# Patient Record
Sex: Female | Born: 1955 | Race: White | Hispanic: No | Smoking: Never smoker
Health system: Southern US, Community
[De-identification: ages and names within clinical notes are randomized; demographics above are authoritative.]

## PROBLEM LIST (undated history)

## (undated) DIAGNOSIS — E785 Hyperlipidemia, unspecified: Secondary | ICD-10-CM

## (undated) DIAGNOSIS — E669 Obesity, unspecified: Secondary | ICD-10-CM

## (undated) DIAGNOSIS — I2510 Atherosclerotic heart disease of native coronary artery without angina pectoris: Secondary | ICD-10-CM

## (undated) DIAGNOSIS — F419 Anxiety disorder, unspecified: Secondary | ICD-10-CM

## (undated) DIAGNOSIS — G4700 Insomnia, unspecified: Secondary | ICD-10-CM

## (undated) DIAGNOSIS — A4902 Methicillin resistant Staphylococcus aureus infection, unspecified site: Secondary | ICD-10-CM

## (undated) DIAGNOSIS — H269 Unspecified cataract: Secondary | ICD-10-CM

## (undated) DIAGNOSIS — N200 Calculus of kidney: Secondary | ICD-10-CM

## (undated) DIAGNOSIS — R079 Chest pain, unspecified: Secondary | ICD-10-CM

## (undated) DIAGNOSIS — R490 Dysphonia: Secondary | ICD-10-CM

## (undated) DIAGNOSIS — G629 Polyneuropathy, unspecified: Secondary | ICD-10-CM

## (undated) DIAGNOSIS — J189 Pneumonia, unspecified organism: Secondary | ICD-10-CM

## (undated) DIAGNOSIS — R55 Syncope and collapse: Secondary | ICD-10-CM

## (undated) DIAGNOSIS — K219 Gastro-esophageal reflux disease without esophagitis: Secondary | ICD-10-CM

## (undated) DIAGNOSIS — F329 Major depressive disorder, single episode, unspecified: Secondary | ICD-10-CM

## (undated) DIAGNOSIS — G4730 Sleep apnea, unspecified: Secondary | ICD-10-CM

## (undated) DIAGNOSIS — C189 Malignant neoplasm of colon, unspecified: Secondary | ICD-10-CM

## (undated) DIAGNOSIS — F4320 Adjustment disorder, unspecified: Secondary | ICD-10-CM

## (undated) DIAGNOSIS — M1990 Unspecified osteoarthritis, unspecified site: Secondary | ICD-10-CM

## (undated) DIAGNOSIS — F341 Dysthymic disorder: Secondary | ICD-10-CM

## (undated) HISTORY — PX: OTHER SURGICAL HISTORY: SHX169

## (undated) HISTORY — DX: Dysphonia: R49.0

## (undated) HISTORY — DX: Insomnia, unspecified: G47.00

## (undated) HISTORY — PX: TUBAL LIGATION: SHX77

## (undated) HISTORY — DX: Calculus of kidney: N20.0

## (undated) HISTORY — DX: Anxiety disorder, unspecified: F41.9

## (undated) HISTORY — DX: Polyneuropathy, unspecified: G62.9

## (undated) HISTORY — DX: Major depressive disorder, single episode, unspecified: F32.9

## (undated) HISTORY — PX: ABDOMINAL HYSTERECTOMY: SHX81

## (undated) HISTORY — DX: Methicillin resistant Staphylococcus aureus infection, unspecified site: A49.02

## (undated) HISTORY — DX: Sleep apnea, unspecified: G47.30

## (undated) HISTORY — PX: CATARACT EXTRACTION, BILATERAL: SHX1313

## (undated) HISTORY — DX: Dysthymic disorder: F34.1

## (undated) HISTORY — DX: Unspecified cataract: H26.9

## (undated) HISTORY — PX: UPPER GASTROINTESTINAL ENDOSCOPY: SHX188

## (undated) HISTORY — DX: Gastro-esophageal reflux disease without esophagitis: K21.9

## (undated) HISTORY — PX: BACK SURGERY: SHX140

## (undated) HISTORY — PX: BLADDER SURGERY: SHX569

## (undated) HISTORY — DX: Unspecified osteoarthritis, unspecified site: M19.90

## (undated) HISTORY — PX: PORT-A-CATH REMOVAL: SHX5289

## (undated) HISTORY — DX: Malignant neoplasm of colon, unspecified: C18.9

## (undated) HISTORY — DX: Adjustment disorder, unspecified: F43.20

---

## 2010-01-08 ENCOUNTER — Encounter: Admission: RE | Admit: 2010-01-08 | Discharge: 2010-01-08 | Payer: Self-pay | Admitting: Neurology

## 2010-06-14 DIAGNOSIS — C189 Malignant neoplasm of colon, unspecified: Secondary | ICD-10-CM | POA: Insufficient documentation

## 2010-06-14 HISTORY — PX: COLON SURGERY: SHX602

## 2010-06-14 HISTORY — DX: Malignant neoplasm of colon, unspecified: C18.9

## 2015-04-09 HISTORY — PX: COLONOSCOPY: SHX174

## 2016-01-07 DIAGNOSIS — C189 Malignant neoplasm of colon, unspecified: Secondary | ICD-10-CM | POA: Diagnosis not present

## 2016-12-18 DIAGNOSIS — R55 Syncope and collapse: Secondary | ICD-10-CM | POA: Diagnosis not present

## 2016-12-18 DIAGNOSIS — E785 Hyperlipidemia, unspecified: Secondary | ICD-10-CM | POA: Diagnosis not present

## 2016-12-18 DIAGNOSIS — R079 Chest pain, unspecified: Secondary | ICD-10-CM | POA: Diagnosis not present

## 2016-12-18 DIAGNOSIS — I2510 Atherosclerotic heart disease of native coronary artery without angina pectoris: Secondary | ICD-10-CM | POA: Diagnosis not present

## 2016-12-19 ENCOUNTER — Observation Stay (HOSPITAL_COMMUNITY)
Admission: AD | Admit: 2016-12-19 | Discharge: 2016-12-20 | Disposition: A | Discharge status: Home / Self Care | Payer: Medicare HMO | Source: Other Acute Inpatient Hospital | Attending: Internal Medicine | Admitting: Internal Medicine

## 2016-12-19 ENCOUNTER — Encounter (HOSPITAL_COMMUNITY): Payer: Self-pay | Admitting: Internal Medicine

## 2016-12-19 DIAGNOSIS — I2510 Atherosclerotic heart disease of native coronary artery without angina pectoris: Secondary | ICD-10-CM | POA: Diagnosis present

## 2016-12-19 DIAGNOSIS — Z91040 Latex allergy status: Secondary | ICD-10-CM | POA: Insufficient documentation

## 2016-12-19 DIAGNOSIS — R079 Chest pain, unspecified: Principal | ICD-10-CM | POA: Insufficient documentation

## 2016-12-19 DIAGNOSIS — R0789 Other chest pain: Secondary | ICD-10-CM | POA: Diagnosis present

## 2016-12-19 DIAGNOSIS — R55 Syncope and collapse: Secondary | ICD-10-CM | POA: Diagnosis not present

## 2016-12-19 DIAGNOSIS — E785 Hyperlipidemia, unspecified: Secondary | ICD-10-CM | POA: Diagnosis not present

## 2016-12-19 DIAGNOSIS — J189 Pneumonia, unspecified organism: Secondary | ICD-10-CM | POA: Diagnosis present

## 2016-12-19 DIAGNOSIS — Z7982 Long term (current) use of aspirin: Secondary | ICD-10-CM | POA: Insufficient documentation

## 2016-12-19 DIAGNOSIS — E669 Obesity, unspecified: Secondary | ICD-10-CM | POA: Diagnosis present

## 2016-12-19 HISTORY — DX: Hyperlipidemia, unspecified: E78.5

## 2016-12-19 HISTORY — DX: Obesity, unspecified: E66.9

## 2016-12-19 HISTORY — DX: Pneumonia, unspecified organism: J18.9

## 2016-12-19 HISTORY — DX: Chest pain, unspecified: R07.9

## 2016-12-19 HISTORY — DX: Syncope and collapse: R55

## 2016-12-19 HISTORY — DX: Atherosclerotic heart disease of native coronary artery without angina pectoris: I25.10

## 2016-12-19 HISTORY — DX: Other chest pain: R07.89

## 2016-12-19 LAB — GLUCOSE, CAPILLARY
Glucose-Capillary: 134 mg/dL — ABNORMAL HIGH (ref 65–99)
Glucose-Capillary: 106 mg/dL — ABNORMAL HIGH (ref 65–99)

## 2016-12-19 LAB — COMPREHENSIVE METABOLIC PANEL WITH GFR
AST: 27 U/L (ref 15–41)
Albumin: 3.3 g/dL — ABNORMAL LOW (ref 3.5–5.0)
Alkaline Phosphatase: 83 U/L (ref 38–126)
BUN: 12 mg/dL (ref 6–20)
Calcium: 8.6 mg/dL — ABNORMAL LOW (ref 8.9–10.3)
GFR calc non Af Amer: >60 mL/min (ref >60)
Glucose, Bld: 88 mg/dL (ref 65–99)
Potassium: 4 mmol/L (ref 3.5–5.1)
Total Bilirubin: 1.1 mg/dL (ref 0.3–1.2)
Total Protein: 7.1 g/dL (ref 6.5–8.1)

## 2016-12-19 LAB — CBC
HCT: 42.2 % (ref 36.0–46.0)
Hemoglobin: 13.6 g/dL (ref 12.0–15.0)
MCH: 29.1 pg (ref 26.0–34.0)
MCHC: 32.2 g/dL (ref 30.0–36.0)
MCV: 90.2 fL (ref 78.0–100.0)
Platelets: 144 K/uL — ABNORMAL LOW (ref 150–400)
RBC: 4.68 MIL/uL (ref 3.87–5.11)
RDW: 14 % (ref 11.5–15.5)
WBC: 11.6 K/uL — ABNORMAL HIGH (ref 4.0–10.5)

## 2016-12-19 LAB — COMPREHENSIVE METABOLIC PANEL
ALT: 19 U/L (ref 14–54)
Anion gap: 12 (ref 5–15)
CO2: 21 mmol/L — ABNORMAL LOW (ref 22–32)
Chloride: 105 mmol/L (ref 101–111)
Creatinine, Ser: 0.91 mg/dL (ref 0.44–1.00)
GFR calc Af Amer: >60 mL/min (ref >60)
Sodium: 138 mmol/L (ref 135–145)

## 2016-12-19 MED ORDER — DULOXETINE HCL 60 MG PO CPEP
60.0000 mg | ORAL_CAPSULE | Freq: Every day | ORAL | Status: DC
  Administered 2016-12-19 – 2016-12-20 (×2): 60 mg via ORAL
  Filled 2016-12-19 (×2): qty 1

## 2016-12-19 MED ORDER — NITROGLYCERIN 0.4 MG SL SUBL
SUBLINGUAL_TABLET | SUBLINGUAL | Status: AC
  Administered 2016-12-19: 0.4 mg
  Filled 2016-12-19: qty 1

## 2016-12-19 MED ORDER — KETOROLAC TROMETHAMINE 15 MG/ML IJ SOLN: 15.0000 mg | Freq: Four times a day (QID) | INTRAMUSCULAR | Status: DC | PRN

## 2016-12-19 MED ORDER — LEVOFLOXACIN 500 MG PO TABS
500.0000 mg | ORAL_TABLET | Freq: Every day | ORAL | Status: DC
  Administered 2016-12-19 – 2016-12-20 (×2): 500 mg via ORAL
  Filled 2016-12-19 (×2): qty 1

## 2016-12-19 MED ORDER — PREDNISONE 10 MG PO TABS
10.0000 mg | ORAL_TABLET | Freq: Every day | ORAL | Status: DC
  Administered 2016-12-20: 10 mg via ORAL
  Filled 2016-12-19: qty 1

## 2016-12-19 MED ORDER — HEPARIN SODIUM (PORCINE) 5000 UNIT/ML IJ SOLN
5000.0000 [IU] | Freq: Three times a day (TID) | INTRAMUSCULAR | Status: AC
  Administered 2016-12-19 (×2): 5000 [IU] via SUBCUTANEOUS
  Filled 2016-12-19 (×2): qty 1

## 2016-12-19 MED ORDER — NITROGLYCERIN 0.4 MG SL SUBL
0.4000 mg | SUBLINGUAL_TABLET | SUBLINGUAL | Status: DC | PRN
  Administered 2016-12-19: 0.4 mg via SUBLINGUAL
  Filled 2016-12-19: qty 1

## 2016-12-19 MED ORDER — ONDANSETRON HCL 4 MG/2ML IJ SOLN: 4.0000 mg | Freq: Four times a day (QID) | INTRAMUSCULAR | Status: DC | PRN

## 2016-12-19 MED ORDER — ACETAMINOPHEN 325 MG PO TABS
650.0000 mg | ORAL_TABLET | Freq: Four times a day (QID) | ORAL | Status: DC | PRN
  Administered 2016-12-19: 650 mg via ORAL
  Filled 2016-12-19: qty 2

## 2016-12-19 MED ORDER — ONDANSETRON HCL 4 MG PO TABS: 4.0000 mg | ORAL_TABLET | Freq: Four times a day (QID) | ORAL | Status: DC | PRN

## 2016-12-19 MED ORDER — ASPIRIN 325 MG PO TABS
325.0000 mg | ORAL_TABLET | Freq: Every day | ORAL | Status: DC
  Administered 2016-12-20: 325 mg via ORAL
  Filled 2016-12-19: qty 1

## 2016-12-19 MED ORDER — ACETAMINOPHEN 650 MG RE SUPP: 650.0000 mg | Freq: Four times a day (QID) | RECTAL | Status: DC | PRN

## 2016-12-19 MED ORDER — ROSUVASTATIN CALCIUM 10 MG PO TABS
20.0000 mg | ORAL_TABLET | Freq: Every day | ORAL | Status: DC
  Administered 2016-12-19 – 2016-12-20 (×2): 20 mg via ORAL
  Filled 2016-12-19 (×2): qty 2

## 2016-12-19 MED ORDER — SODIUM CHLORIDE 0.9% FLUSH
3.0000 mL | Freq: Two times a day (BID) | INTRAVENOUS | Status: DC
  Administered 2016-12-19 – 2016-12-20 (×3): 3 mL via INTRAVENOUS

## 2016-12-19 NOTE — H&P (Signed)
History and Physical    [name] ZTI:[id number] DOB: 1955/09/05 DOA: 12/19/2016  PCP: Charlynn Court, NP Patient coming from: [facility] in [place]: chest pain  HPI: [name] is a 61 y.o. female with medical history significant for coronary artery disease, status post cardiac cath 5 years ago with nonobstructive CAD, hyperlipidemia, GERD, obesity is admitted to room 42 on 3 W. at [facility] from [place] for possible cardiac cath.  Patient admitted to and off hospital in [place] yesterday chief complaint chest pain shortness of breath dizziness presyncope. Associated symptoms include palpation diaphoresis nausea without vomiting. Risk factors include known CAD, obesity, hyperlipidemia. She was admitted last seventh for observation. Chart review indicates no significant events on telemetry cardiac enzymes were negative 4 EKG on admission no acute changes. She was started on full dose aspirin. During the night last night she had an event of chest pain which resolved with nitroglycerin. Repeat EKG this morning revealed some flattening of T-wave. Case discussed with cardiology Dr. Tempie Hoist who [facility].  Patient reports she was chest pain-free until her ride over here. He is provided with morphine and states on admission pain "barely there". She denies headache dizziness nausea vomiting diarrhea constipation. She denies dysuria hematuria frequency or urgency. She denies fever chills cough lower extremity edema or orthopnea.    ED Course: Not applicable  Review of Systems: As per HPI otherwise all other systems reviewed and are negative. 10 point review of systems complete and all systems are negative except as indicated in the history of present illness  Ambulatory Status: He relates independently  Past Medical History:  Diagnosis Date  . CAD (coronary artery disease)   . Chest pain   . Hyperlipidemia   . Obesity   .  Pneumonia   . Pre-syncope     History reviewed. No pertinent surgical history.  Social History   Social History  . Marital status: Widowed    Spouse name: N/A  . Number of children: N/A  . Years of education: N/A   Occupational History  . Not on file.   Social History Main Topics  . Smoking status: Not on file  . Smokeless tobacco: Not on file  . Alcohol use Not on file  . Drug use: Unknown  . Sexual activity: Not on file   Other Topics Concern  . Not on file   Social History Narrative  . No narrative on file    Allergies  Allergen Reactions  . Penicillins Rash  . Codeine Other (See Comments)    dizziness  . Guaifenesin & Derivatives Rash  . Latex Rash    Usually from adhesive tapes  . Pseudoephedrine Rash  . Sulfa Antibiotics Rash    History reviewed. No pertinent family history.  Prior to Admission medications   Not on File    Physical Exam: Vitals:   12/19/16 1441  BP: 113/82  Pulse: 100  Resp: 18  Temp: 98.4 F (36.9 C)  TempSrc: Oral  SpO2: 99%  Weight: 70.9 kg (156 lb 6.4 oz)  Height: 5' (1.524 m)     General:  Appears calm and comfortable, obese standing side of the bed in no acute distress Eyes:  PERRL, EOMI, normal lids, iris ENT:  grossly normal hearing, lips & tongue, mmm Neck:  no LAD, masses or thyromegaly Cardiovascular:  RRR, no m/r/g. No LE edema.  Respiratory:  CTA bilaterally, no w/r/r. Normal respiratory effort. Abdomen:  soft,  ntnd, obese soft positive bowel sounds no guarding or rebounding Skin:  no rash or induration seen on limited exam Musculoskeletal:  grossly normal tone BUE/BLE, good ROM, no bony abnormality Psychiatric:  grossly normal mood and affect, speech fluent and appropriate, AOx3 Neurologic:  CN 2-12 grossly intact, moves all extremities in coordinated fashion, sensation intact  Labs on Admission: I have personally reviewed following labs and imaging studies  CBC: No results for input(s): WBC,  NEUTROABS, HGB, HCT, MCV, PLT in the last 168 hours. Basic Metabolic Panel: No results for input(s): NA, K, CL, CO2, GLUCOSE, BUN, CREATININE, CALCIUM, MG, PHOS in the last 168 hours. GFR: CrCl cannot be calculated (No order found.). Liver Function Tests: No results for input(s): AST, ALT, ALKPHOS, BILITOT, PROT, ALBUMIN in the last 168 hours. No results for input(s): LIPASE, AMYLASE in the last 168 hours. No results for input(s): AMMONIA in the last 168 hours. Coagulation Profile: No results for input(s): INR, PROTIME in the last 168 hours. Cardiac Enzymes: No results for input(s): CKTOTAL, CKMB, CKMBINDEX, TROPONINI in the last 168 hours. BNP (last 3 results) No results for input(s): PROBNP in the last 8760 hours. HbA1C: No results for input(s): HGBA1C in the last 72 hours. CBG: No results for input(s): GLUCAP in the last 168 hours. Lipid Profile: No results for input(s): CHOL, HDL, LDLCALC, TRIG, CHOLHDL, LDLDIRECT in the last 72 hours. Thyroid Function Tests: No results for input(s): TSH, T4TOTAL, FREET4, T3FREE, THYROIDAB in the last 72 hours. Anemia Panel: No results for input(s): VITAMINB12, FOLATE, FERRITIN, TIBC, IRON, RETICCTPCT in the last 72 hours. Urine analysis: No results found for: COLORURINE, APPEARANCEUR, LABSPEC, PHURINE, GLUCOSEU, HGBUR, BILIRUBINUR, KETONESUR, PROTEINUR, UROBILINOGEN, NITRITE, LEUKOCYTESUR  Creatinine Clearance: CrCl cannot be calculated (No order found.).  Sepsis Labs: @LABRCNTIP (procalcitonin:4,lacticidven:4) )No results found for this or any previous visit (from the past 240 hour(s)).   Radiological Exams on Admission: No results found.  EKG: Independently reviewed. Normal sinus rhythm Normal ECG  Assessment/Plan Principal Problem:   Chest pain Active Problems:   Pre-syncope   CAD (coronary artery disease)   Hyperlipidemia   Obesity   Pneumonia   #1. Chest pain. Some typical and atypical features. Heart score 4. Troponins  negative 4. EKG as noted above. Chest x-ray yesterday with left base opacity. She did experience chest pain last night with some EKG changes. Evaluated by cardiology and aspirate today who recommended [facility] -Monitor on telemetry -Continue aspirin and statin -Follow echo results -Informed Dr. Jacinta Shoe of patient arrival -Nothing by mouth past midnight  #2. Pneumonia. Chest x-ray with finding of left base opacity. Patient did report a mild cough. She was provided with Rocephin and azithromycin empirically. Discharged with Levaquin at time of transfer. She is afebrile hemodynamically stable and not hypoxic. -continue levaquin  #3. CAD. Reportedly had a cath greater than 5 years ago revealing nonobstructive CAD. -See #1 -Aspirin and statin -A cardiology recommendations     DVT prophylaxis: heparin Code Status: full  Family Communication: daughter at bedside  Disposition Plan: home  Consults called: dr Jacinta Shoe  Admission status: obs   Radene Gunning MD [facility]  If 7PM-7AM, please contact night-coverage [email or web address] Password [facility]  12/19/2016, 3:43 PM

## 2016-12-19 NOTE — Consult Note (Signed)
Cardiology Consult    Patient ID: [name] MRN: [id number], DOB/AGE: 1956-04-18   Admit date: 12/19/2016 Date of Consult: 12/19/2016  Primary Physician: Charlynn Court, NP Reason for Consult: Chest Pain and Syncope Primary Cardiologist: New to [facility]  Requesting Provider: Dr. Aggie Moats   History of Present Illness    [name] is a 61 y.o. female with past medical history of CAD (nonobstructive CAD by cath 5+ years ago), HLD, and GERD who is being seen today for the evaluation of chest pain and syncope at the request of Dr. Aggie Moats.  In talking with the patient today, she reports having an episode of chest pain while attending a funeral. She describes it as a shooting pain which radiated across her chest. It only lasted for a few seconds then spontaneously resolved, then she developed a second episode which prompted her to call EMS. She denies any recent dyspnea on exertion, orthopnea, PND, or lower extremity edema. Experiences palpitations which occur for seconds at a time (actively having PVC's on telemetry during this encounter).   While at [facility], cardiac enzymes remained negative x4. Creatinine 0.80. CXR showed a focal area of increased opacity along the medial left base concerning for a focal PNA. EKG shows NSR, HR 91, with mild T-wave flattening in Lead III.    She has noted recurrent episodes of chest pain since admission at [facility] which has been relieved with SL NTG.   She reports having a heart catheterization 5+ years ago but did not require stent placement or PCI at that time. She reports having known HLD but denies any known HTN, Type 2 DM, or Stage 3 CKD. She denies any tobacco use or alcohol use. She does have a family history of CAD as her mother passed away from an MI in her 4's.    Past Medical History   Past Medical History:  Diagnosis Date  . CAD (coronary artery disease)   . Chest pain   . Hyperlipidemia   . Obesity   . Pneumonia   . Pre-syncope      History reviewed. No pertinent surgical history.   Allergies  Allergies  Allergen Reactions  . Penicillins Rash  . Codeine Other (See Comments)    dizziness  . Guaifenesin & Derivatives Rash  . Latex Rash    Usually from adhesive tapes  . Pseudoephedrine Rash  . Sulfa Antibiotics Rash    Inpatient Medications    . [START ON 12/20/2016] aspirin  325 mg Oral Daily  . DULoxetine  60 mg Oral Daily  . heparin  5,000 Units Subcutaneous Q8H  . [START ON 12/20/2016] levofloxacin  500 mg Oral Daily  . [START ON 12/20/2016] predniSONE  10 mg Oral Q breakfast  . rosuvastatin  20 mg Oral q1800  . sodium chloride flush  3 mL Intravenous Q12H    Family History    Family History  Problem Relation Age of Onset  . CAD Mother        Age of Onset 33  . Hypertension Mother   . CVA Brother     Social History    Social History   Social History  . Marital status: Widowed    Spouse name: N/A  . Number of children: N/A  . Years of education: N/A   Occupational History  . Not on file.   Social History Main Topics  . Smoking status: Not on file  . Smokeless tobacco: Not on file  . Alcohol use Not  on file  . Drug use: Unknown  . Sexual activity: Not on file   Other Topics Concern  . Not on file   Social History Narrative  . No narrative on file     Review of Systems    General:  No chills, fever, night sweats or weight changes.  Cardiovascular:  No dyspnea on exertion, edema, orthopnea, paroxysmal nocturnal dyspnea. Positive for chest pain and palpitations.  Dermatological: No rash, lesions/masses Respiratory: No cough, dyspnea Urologic: No hematuria, dysuria Abdominal:   No nausea, vomiting, diarrhea, bright red blood per rectum, melena, or hematemesis Neurologic:  No visual changes, wkns, changes in mental status. All other systems reviewed and are otherwise negative except as noted above.  Physical Exam    Blood pressure 113/82, pulse 100, temperature 98.4 F (36.9  C), temperature source Oral, resp. rate 18, height 5' (1.524 m), weight 156 lb 6.4 oz (70.9 kg), SpO2 99 %.  General: Pleasant Caucasian female appearing in NAD Psych: Normal affect. Neuro: Alert and oriented X 3. Moves all extremities spontaneously. HEENT: Normal  Neck: Supple without bruits or JVD. Lungs:  Resp regular and unlabored, CTA without wheezing or rales. Heart: RRR no s3, s4, or murmurs. Abdomen: Soft, non-tender, non-distended, BS + x 4.  Extremities: No clubbing, cyanosis or edema. DP/PT/Radials 2+ and equal bilaterally.  Labs    Troponin (Point of Care Test) No results for input(s): TROPIPOC in the last 72 hours. No results for input(s): CKTOTAL, CKMB, TROPONINI in the last 72 hours. No results found for: WBC, HGB, HCT, MCV, PLT No results for input(s): NA, K, CL, CO2, BUN, CREATININE, CALCIUM, PROT, BILITOT, ALKPHOS, ALT, AST, GLUCOSE in the last 168 hours.  Invalid input(s): LABALBU No results found for: CHOL, HDL, LDLCALC, TRIG No results found for: [facility]   Radiology Studies    No results found.  EKG & Cardiac Imaging    EKG:  NSR, HR 91, with mild T-wave flattening in Lead III.   - Personally Reviewed  Echocardiogram: None on File  Assessment & Plan    1. Atypical Chest Pain - she developed an episode of shooting pain across her chest which only lasted for seconds at a time. Occurred while sitting down at a funeral. No exertional component noted. She has experienced recurrent episodes which last for less than 5 minutes each but are relieved with SL NTG. - cardiac enzymes were checked at [facility] and remained negative x4 per review of her chart. EKG shows NSR, HR 91, with mild T-wave flattening in Lead III.   - she did have a cardiac catheterization 5+ years ago but did not require stent placement or PCI at that time.  - overall her symptoms seem atypical for ACS. [name] plan for a Lexiscan Myoview in the AM. NPO after midnight.   2. PVC's - reports having  frequent palpitations which only last for seconds at a time - noted to have frequent PVC's on telemetry. Continue to monitor. Could consider BB therapy as she is aware of these but the benign nature of PAC's/PVC's was reviewed with the patient.   3. HLD - on Crestor 20mg  daily as an outpatient.  - recheck FLP.   4. PNA - CXR at [facility] showed a possible focal PNA.  - per admitting team   Signed, Erma Heritage, PA-C 12/19/2016, 3:44 PM Pager: [phone]  I have seen and examined this patient with [place].  Agree with above, note added to reflect my findings.  On exam,  RRR, no murmurs, lungs clear. Had chest pain that developed yesterday while at a church for a funeral. troponin negative x4. Had pain which was somewhat improved by NTG. As troponin negative, Marcelina Mclaurin plan on nuclear stress testing. Should this be positive, plan for catheterization. She is having PVCs on the monitor and is having palpitations. May benefit from outpatient monitoring to quantify if PVCs continue and are frequent.    Wlliam Grosso M. Harrison Paulson MD 12/19/2016 4:36 PM

## 2016-12-20 ENCOUNTER — Observation Stay (HOSPITAL_BASED_OUTPATIENT_CLINIC_OR_DEPARTMENT_OTHER): Payer: Medicare HMO

## 2016-12-20 DIAGNOSIS — R079 Chest pain, unspecified: Secondary | ICD-10-CM | POA: Diagnosis not present

## 2016-12-20 DIAGNOSIS — E785 Hyperlipidemia, unspecified: Secondary | ICD-10-CM

## 2016-12-20 DIAGNOSIS — J189 Pneumonia, unspecified organism: Secondary | ICD-10-CM | POA: Diagnosis not present

## 2016-12-20 DIAGNOSIS — R55 Syncope and collapse: Secondary | ICD-10-CM | POA: Diagnosis not present

## 2016-12-20 LAB — CBC
HCT: 43.6 % (ref 36.0–46.0)
Hemoglobin: 14 g/dL (ref 12.0–15.0)
MCH: 28.8 pg (ref 26.0–34.0)
MCHC: 32.1 g/dL (ref 30.0–36.0)
MCV: 89.7 fL (ref 78.0–100.0)
Platelets: 145 10*3/uL — ABNORMAL LOW (ref 150–400)
RBC: 4.86 MIL/uL (ref 3.87–5.11)
RDW: 14 % (ref 11.5–15.5)
WBC: 8.8 10*3/uL (ref 4.0–10.5)

## 2016-12-20 LAB — NM MYOCAR MULTI W/SPECT W/WALL MOTION / EF
Estimated workload: 1 METS
Exercise duration (min): 5 min
Exercise duration (sec): 15 s
MPHR: 157 {beats}/min
Peak HR: 115 {beats}/min
Percent HR: 73 %
Rest HR: 75 {beats}/min

## 2016-12-20 LAB — GLUCOSE, CAPILLARY
Glucose-Capillary: 124 mg/dL — ABNORMAL HIGH (ref 65–99)
Glucose-Capillary: 176 mg/dL — ABNORMAL HIGH (ref 65–99)

## 2016-12-20 LAB — BASIC METABOLIC PANEL
BUN: 8 mg/dL (ref 6–20)
Calcium: 9 mg/dL (ref 8.9–10.3)
Creatinine, Ser: 0.89 mg/dL (ref 0.44–1.00)
GFR calc non Af Amer: >60 mL/min (ref >60)
Glucose, Bld: 108 mg/dL — ABNORMAL HIGH (ref 65–99)
Sodium: 138 mmol/L (ref 135–145)

## 2016-12-20 LAB — BASIC METABOLIC PANEL WITH GFR
Anion gap: 6 (ref 5–15)
CO2: 27 mmol/L (ref 22–32)
Chloride: 105 mmol/L (ref 101–111)
GFR calc Af Amer: >60 mL/min (ref >60)
Potassium: 4.6 mmol/L (ref 3.5–5.1)

## 2016-12-20 LAB — HIV ANTIBODY (ROUTINE TESTING W REFLEX): HIV Screen 4th Generation wRfx: NONREACTIVE

## 2016-12-20 MED ORDER — PANTOPRAZOLE SODIUM 40 MG PO TBEC: 40.0000 mg | DELAYED_RELEASE_TABLET | Freq: Every day | ORAL | 0 refills | Status: DC

## 2016-12-20 MED ORDER — REGADENOSON 0.4 MG/5ML IV SOLN
INTRAVENOUS | Status: AC
  Filled 2016-12-20: qty 5

## 2016-12-20 MED ORDER — TECHNETIUM TC 99M TETROFOSMIN IV KIT
10.0000 | PACK | Freq: Once | INTRAVENOUS | Status: AC | PRN
  Administered 2016-12-20: 10 via INTRAVENOUS

## 2016-12-20 MED ORDER — PANTOPRAZOLE SODIUM 40 MG PO TBEC
40.0000 mg | DELAYED_RELEASE_TABLET | Freq: Every day | ORAL | Status: DC
  Administered 2016-12-20: 40 mg via ORAL
  Filled 2016-12-20: qty 1

## 2016-12-20 MED ORDER — REGADENOSON 0.4 MG/5ML IV SOLN
0.4000 mg | Freq: Once | INTRAVENOUS | Status: AC
  Administered 2016-12-20: 0.4 mg via INTRAVENOUS

## 2016-12-20 MED ORDER — LEVOFLOXACIN 500 MG PO TABS: 500.0000 mg | ORAL_TABLET | Freq: Every day | ORAL | 0 refills | Status: DC

## 2016-12-20 MED ORDER — TECHNETIUM TC 99M TETROFOSMIN IV KIT
30.0000 | PACK | Freq: Once | INTRAVENOUS | Status: AC | PRN
  Administered 2016-12-20: 30 via INTRAVENOUS

## 2016-12-20 NOTE — Discharge Summary (Signed)
Physician Discharge Summary  [name] MWN:[id number] DOB: [birth year] DOA: 12/19/2016  PCP: Megan Court, NP  Admit date: 12/19/2016 Discharge date: 12/20/2016   Recommendations for Outpatient Follow-Up:   1. Outpatient holter monitor 2. Monitor HgbA1C    Discharge Diagnosis:   Principal Problem:   Chest pain Active Problems:   Pre-syncope   CAD (coronary artery disease)   Hyperlipidemia   Obesity   Pneumonia   Discharge disposition:  Home.  Discharge Condition: Improved.  Diet recommendation: Low sodium, heart healthy  Wound care: None.   History of Present Illness:   Megan Teagueis a 61 y.o.femalewith a Past Medical History significant for coronary artery disease, hyperlipidemia, obesity who presents with chest pain.   Hospital Course by Problem:   Chest pain. Some typical and atypical features. Heart score 4 -stress test low risk -suspect from PNA -outpatient holter monitor   Pneumonia.  -levaquin  CAD. Reportedly had a cath greater than 5 years ago revealing nonobstructive CAD. -Aspirin and statin     Medical Consultants:    cards.   Discharge Exam:   Vitals:   12/20/16 1012 12/20/16 1300  BP: 136/81 126/77  Pulse: 96 91  Resp:    Temp:  98.4 F (36.9 C)   Vitals:   12/20/16 1008 12/20/16 1010 12/20/16 1012 12/20/16 1300  BP: 136/83 130/82 136/81 126/77  Pulse: (!) 101 (!) 103 96 91  Resp:      Temp:    98.4 F (36.9 C)  TempSrc:    Axillary  SpO2:    98%  Weight:      Height:           The results of significant diagnostics from this hospitalization (including imaging, microbiology, ancillary and laboratory) are listed below for reference.     Procedures and Diagnostic Studies:   Nm Myocar Multi W/spect W/wall Motion / Ef  Result Date: 12/20/2016 CLINICAL DATA:  Chest pain EXAM: MYOCARDIAL IMAGING WITH SPECT (REST AND PHARMACOLOGIC-STRESS) GATED LEFT VENTRICULAR WALL MOTION STUDY LEFT VENTRICULAR EJECTION  FRACTION TECHNIQUE: Standard myocardial SPECT imaging was performed after resting intravenous injection of 10 mCi Tc-22m tetrofosmin. Subsequently, intravenous infusion of Lexiscan was performed under the supervision of the Cardiology staff. At peak effect of the drug, 30 mCi Tc-82m tetrofosmin was injected intravenously and standard myocardial SPECT imaging was performed. Quantitative gated imaging was also performed to evaluate left ventricular wall motion, and estimate left ventricular ejection fraction. COMPARISON:  None. FINDINGS: Perfusion: No decreased activity in the left ventricle on stress imaging to suggest reversible ischemia or infarction. Wall Motion: Normal left ventricular wall motion. No left ventricular dilation. Left Ventricular Ejection Fraction: 61 % End diastolic volume 72 ml End systolic volume 28 ml IMPRESSION: 1. No reversible ischemia or infarction. 2. Normal left ventricular wall motion. 3. Left ventricular ejection fraction 61% 4. Non invasive risk stratification*: Low *2012 Appropriate Use Criteria for Coronary Revascularization Focused Update: J Am Coll Cardiol. 4034;74(2):595-638. http://content.airportbarriers.com.aspx?articleid=[email or web address] Electronically Signed   By: [name] M.D.   On: 12/20/2016 16:17     Labs:   Basic Metabolic Panel:  Recent Labs Lab 12/19/16 1505 12/20/16 0232  NA 138 138  K 4.0 4.6  CL 105 105  CO2 21* 27  GLUCOSE 88 108*  BUN 12 8  CREATININE 0.91 0.89  CALCIUM 8.6* 9.0   GFR Estimated Creatinine Clearance: 56.9 mL/min (by C-G formula based on SCr of 0.89 mg/dL). Liver Function Tests:  Recent Labs Lab 12/19/16 1505  AST  27  ALT 19  ALKPHOS 83  BILITOT 1.1  PROT 7.1  ALBUMIN 3.3*   No results for input(s): LIPASE, AMYLASE in the last 168 hours. No results for input(s): AMMONIA in the last 168 hours. Coagulation profile No results for input(s): INR, PROTIME in the last 168 hours.  CBC:  Recent Labs Lab 12/19/16 1505  12/20/16 0232  WBC 11.6* 8.8  HGB 13.6 14.0  HCT 42.2 43.6  MCV 90.2 89.7  PLT 144* 145*   Cardiac Enzymes: No results for input(s): CKTOTAL, CKMB, CKMBINDEX, TROPONINI in the last 168 hours. BNP: Invalid input(s): POCBNP CBG:  Recent Labs Lab 12/19/16 1609 12/19/16 2106 12/20/16 1259 12/20/16 1625  GLUCAP 134* 106* 124* 176*   D-Dimer No results for input(s): DDIMER in the last 72 hours. Hgb A1c No results for input(s): HGBA1C in the last 72 hours. Lipid Profile No results for input(s): CHOL, HDL, LDLCALC, TRIG, CHOLHDL, LDLDIRECT in the last 72 hours. Thyroid function studies No results for input(s): TSH, T4TOTAL, T3FREE, THYROIDAB in the last 72 hours.  Invalid input(s): FREET3 Anemia work up No results for input(s): VITAMINB12, FOLATE, FERRITIN, TIBC, IRON, RETICCTPCT in the last 72 hours. Microbiology No results found for this or any previous visit (from the past 240 hour(s)).   Discharge Instructions:   Discharge Instructions    Diet - low sodium heart healthy    Complete by:  As directed    Discharge instructions    Complete by:  As directed    You have been taking diclofenac (this is an NSAID and can cause GI upset so I added protonix).  You had a low risk cardiac stress test.  Also we are treating you for a possible respiratory infection.  If this does not improve your discomfort, will reccommend that your PCP refer you to a GI doctor (gastroenterology)   Increase activity slowly    Complete by:  As directed      Allergies as of 12/20/2016      Reactions   Penicillins Rash   Has patient had a PCN reaction causing immediate rash, facial/tongue/throat swelling, SOB or lightheadedness with hypotension: Yes Has patient had a PCN reaction causing severe rash involving mucus membranes or skin necrosis: No Has patient had a PCN reaction that required hospitalization: No Has patient had a PCN reaction occurring within the last 10 years: No If all of the above  answers are "NO", then may proceed with Cephalosporin use.   Codeine Other (See Comments)   Dizziness and lethargy   Guaifenesin & Derivatives Rash   Latex Rash   Usually from adhesive tapes   Pseudoephedrine Rash   Sulfa Antibiotics Rash   Tape Rash   Cannot be on the skin for an extended period of time      Medication List    STOP taking these medications   diclofenac 50 MG EC tablet Commonly known as:  VOLTAREN     TAKE these medications   aspirin EC 81 MG tablet Take 81 mg by mouth daily.   CALCIUM PO Take 1 tablet by mouth daily.   CENTRUM SILVER 50+WOMEN Tabs Take 1 tablet by mouth daily.   DULoxetine 60 MG capsule Commonly known as:  CYMBALTA Take 60 mg by mouth daily.   fluticasone 50 MCG/ACT nasal spray Commonly known as:  FLONASE Place 1 spray into both nostrils 2 (two) times daily as needed for allergies or rhinitis.   gabapentin 400 MG capsule Commonly known as:  NEURONTIN Take  800-1,200 mg by mouth See admin instructions. 800 mg in the morning then 800 mg midday then 1,200 mg at bedtime   levofloxacin 500 MG tablet Commonly known as:  LEVAQUIN Take 1 tablet (500 mg total) by mouth daily at 6 PM.   NITROSTAT 0.4 MG SL tablet Generic drug:  nitroGLYCERIN Place 0.4 mg under the tongue every 5 (five) minutes x 3 doses as needed for chest pain.   pantoprazole 40 MG tablet Commonly known as:  PROTONIX Take 1 tablet (40 mg total) by mouth daily.   rosuvastatin 20 MG tablet Commonly known as:  CRESTOR Take 20 mg by mouth at bedtime.   triamcinolone cream 0.1 % Commonly known as:  KENALOG Apply 1 application topically See admin instructions. TO AFFECTED AREA(S) DAILY AS DIRECTED   Vitamin D3 2000 units Tabs Take 2,000 Units by mouth daily.      Follow-up Information    Megan Court, NP Follow up in 1 week(s).   Specialty:  Nurse Practitioner Contact information: [place] [address] [phone]            Time  coordinating discharge: 35 min  Signed:  [place]   [facility] 12/20/2016, 5:11 PM

## 2016-12-20 NOTE — Progress Notes (Signed)
   Alma Downs presented for a nuclear stress test today.  No immediate complications.  Stress imaging is pending at this time.  Preliminary EKG findings may be listed in the chart, but the stress test result will not be finalized until perfusion imaging is complete.  1 day study, [place] to read.  Rosaria Ferries, PA-C 12/20/2016, 10:05 AM

## 2016-12-20 NOTE — Progress Notes (Signed)
Await results of nuclear stress test prior to further recs.

## 2016-12-20 NOTE — Care Management Note (Signed)
Case Management Note  Patient Details  Name: [name] MRN: [id number] Date of Birth: [birth year]  Subjective/Objective: Pt presented for Chest Pain. Post Stress Test. Pt is from home with son and she also has support of her daughter. Pt does not use any DME- [employer]. Pt has PCP in [place], Evelena Leyden, NP. Per pt she gets her medications without any problems.                   Action/Plan: No needs identified at this time.   Expected Discharge Date:                  Expected Discharge Plan:  Home/Self Care  In-House Referral:  NA  Discharge planning Services  CM Consult  Post Acute Care Choice:  NA Choice offered to:  NA  DME Arranged:  N/A DME Agency:  NA  HH Arranged:  NA HH Agency:  NA  Status of Service:  Completed, signed off  If discussed at [place] of Stay Meetings, dates discussed:    Additional Comments:  Bethena Roys, RN 12/20/2016, 11:54 AM

## 2016-12-20 NOTE — Progress Notes (Signed)
Nuclear stress test showed no ischemia and normal LVF.  Low risk study.  No further ischemic workup at this time.  She did have frequent PVCs on presentation so will set up outpt Holter to assess PVC load.  Will sign off. Call with any questions.

## 2016-12-20 NOTE — Progress Notes (Signed)
PROGRESS NOTE    [name]  TZG:[id number] DOB: Jan 30, 1956 DOA: 12/19/2016 PCP: Charlynn Court, NP   Outpatient Specialists:    Brief Narrative:  [name] is a 61 y.o. female with a Past Medical History significant for coronary artery disease, hyperlipidemia, obesity who presents with chest pain.   Assessment & Plan:   Principal Problem:   Chest pain Active Problems:   Pre-syncope   CAD (coronary artery disease)   Hyperlipidemia   Obesity   Pneumonia   Chest pain. Some typical and atypical features. Heart score 4. Troponins negative 4. EKG as noted above.  -stress test today   Pneumonia.  -continue levaquin  CAD. Reportedly had a cath greater than 5 years ago revealing nonobstructive CAD. -Aspirin and statin -s/p stress test- await results   DVT prophylaxis:  SQ Heparin  Code Status: Full Code   Family Communication: At bedside  Disposition Plan:     Consultants:   cards     Subjective: No SOB, no current chest pain- last this AM  Objective: Vitals:   12/20/16 1008 12/20/16 1010 12/20/16 1012 12/20/16 1300  BP: 136/83 130/82 136/81 126/77  Pulse: (!) 101 (!) 103 96 91  Resp:      Temp:    98.4 F (36.9 C)  TempSrc:    Axillary  SpO2:    98%  Weight:      Height:        Intake/Output Summary (Last 24 hours) at 12/20/16 1350 Last data filed at 12/20/16 1300  Gross per 24 hour  Intake              240 ml  Output                0 ml  Net              240 ml   Filed Weights   12/19/16 1441 12/20/16 0600  Weight: 70.9 kg (156 lb 6.4 oz) 71.1 kg (156 lb 12.8 oz)    Examination:  General exam: Appears calm and comfortable  Respiratory system: Clear to auscultation. Respiratory effort normal. Cardiovascular system: S1 & S2 heard, RRR. No JVD, murmurs, rubs, gallops or clicks. No pedal edema. Gastrointestinal system: Abdomen is nondistended, soft and nontender. No organomegaly or masses felt. Normal bowel sounds  heard. Central nervous system: Alert and oriented. No focal neurological deficits. Extremities: Symmetric 5 x 5 power. Skin: No rashes, lesions or ulcers Psychiatry: Judgement and insight appear normal. Mood & affect appropriate.     Data Reviewed: I have personally reviewed following labs and imaging studies  CBC:  Recent Labs Lab 12/19/16 1505 12/20/16 0232  WBC 11.6* 8.8  HGB 13.6 14.0  HCT 42.2 43.6  MCV 90.2 89.7  PLT 144* 759*   Basic Metabolic Panel:  Recent Labs Lab 12/19/16 1505 12/20/16 0232  NA 138 138  K 4.0 4.6  CL 105 105  CO2 21* 27  GLUCOSE 88 108*  BUN 12 8  CREATININE 0.91 0.89  CALCIUM 8.6* 9.0   GFR: Estimated Creatinine Clearance: 56.9 mL/min (by C-G formula based on SCr of 0.89 mg/dL). Liver Function Tests:  Recent Labs Lab 12/19/16 1505  AST 27  ALT 19  ALKPHOS 83  BILITOT 1.1  PROT 7.1  ALBUMIN 3.3*   No results for input(s): LIPASE, AMYLASE in the last 168 hours. No results for input(s): AMMONIA in the last 168 hours. Coagulation Profile: No results for input(s): INR, PROTIME in the last 168  hours. Cardiac Enzymes: No results for input(s): CKTOTAL, CKMB, CKMBINDEX, TROPONINI in the last 168 hours. BNP (last 3 results) No results for input(s): PROBNP in the last 8760 hours. HbA1C: No results for input(s): HGBA1C in the last 72 hours. CBG:  Recent Labs Lab 12/19/16 1609 12/19/16 2106 12/20/16 1259  GLUCAP 134* 106* 124*   Lipid Profile: No results for input(s): CHOL, HDL, LDLCALC, TRIG, CHOLHDL, LDLDIRECT in the last 72 hours. Thyroid Function Tests: No results for input(s): TSH, T4TOTAL, FREET4, T3FREE, THYROIDAB in the last 72 hours. Anemia Panel: No results for input(s): VITAMINB12, FOLATE, FERRITIN, TIBC, IRON, RETICCTPCT in the last 72 hours. Urine analysis: No results found for: COLORURINE, APPEARANCEUR, LABSPEC, Friend, GLUCOSEU, HGBUR, BILIRUBINUR, KETONESUR, PROTEINUR, UROBILINOGEN, NITRITE,  LEUKOCYTESUR   )No results found for this or any previous visit (from the past 240 hour(s)).    Anti-infectives    Start     Dose/Rate Route Frequency Ordered Stop   12/19/16 1800  levofloxacin (LEVAQUIN) tablet 500 mg     500 mg Oral Daily-1800 12/19/16 1535         Radiology Studies: No results found.      Scheduled Meds: . aspirin  325 mg Oral Daily  . DULoxetine  60 mg Oral Daily  . levofloxacin  500 mg Oral q1800  . predniSONE  10 mg Oral Q breakfast  . regadenoson      . rosuvastatin  20 mg Oral q1800  . sodium chloride flush  3 mL Intravenous Q12H   Continuous Infusions:   LOS: 1 day    Time spent: 25 min    Welsh, DO [facility] Pager [phone]  If 7PM-7AM, please contact night-coverage [email or web address] Password [email or web address] 12/20/2016, 1:50 PM

## 2016-12-20 NOTE — Care Management Obs Status (Signed)
[facility] NOTIFICATION   Patient Details  Name: [name] MRN: [id number] Date of Birth: 1956-03-15   Medicare Observation Status Notification Given:  Yes    Bethena Roys, RN 12/20/2016, 11:54 AM

## 2017-01-20 ENCOUNTER — Ambulatory Visit (INDEPENDENT_AMBULATORY_CARE_PROVIDER_SITE_OTHER): Payer: Medicare HMO | Admitting: Cardiology

## 2017-01-20 ENCOUNTER — Encounter: Payer: Self-pay | Admitting: Cardiology

## 2017-01-20 VITALS — BP 140/90 | HR 84 | Resp 20 | Ht 60.0 in | Wt 158.1 lb

## 2017-01-20 DIAGNOSIS — R002 Palpitations: Secondary | ICD-10-CM | POA: Diagnosis not present

## 2017-01-20 DIAGNOSIS — I2510 Atherosclerotic heart disease of native coronary artery without angina pectoris: Secondary | ICD-10-CM

## 2017-01-20 DIAGNOSIS — E782 Mixed hyperlipidemia: Secondary | ICD-10-CM | POA: Diagnosis not present

## 2017-01-20 HISTORY — DX: Palpitations: R00.2

## 2017-01-20 MED ORDER — METOPROLOL SUCCINATE ER 25 MG PO TB24: 25.0000 mg | ORAL_TABLET | Freq: Two times a day (BID) | ORAL | 11 refills | Status: DC

## 2017-01-20 NOTE — Patient Instructions (Addendum)
Medication Instructions:  Your physician has recommended you make the following change in your medications: START: Toprol (metoprolol succinate) 25 mg twice daily.   Labwork: None   Testing/Procedures: EKG today in office.   Follow-Up: Your physician recommends that you schedule a follow-up appointment in: 1 month   Any Other Special Instructions Will Be Listed Below (If Applicable).  Please note that any paperwork needing to be filled out by the provider will need to be addressed at the front desk prior to seeing the provider. Please note that any paperwork FMLA, Disability or other documents regarding health condition is subject to a $25.00 charge that must be received prior to completion of paperwork.    If you need a refill on your cardiac medications before your next appointment, please call your pharmacy.

## 2017-01-20 NOTE — Progress Notes (Signed)
Cardiology Consultation:    Date:  01/20/2017   ID:  [name], [name] 09-24-1955, MRN [id number]  PCP:  Charlynn Court, NP  Cardiologist:  Jenne Campus, MD   Referring MD: Charlynn Court, NP   Chief Complaint  Patient presents with  . Hospitalization Follow-up  . Chest Pain  Having Chest Pain  History of Present Illness:    [name] is a 61 y.o. female who is being seen today for the evaluation of Chest pain at the request of Charlynn Court, NP. Patient is a 61 is a woman who many years ago had cardiac catheterization done. She was told to have 40% blockage one of the artery. Since that time she started experiencing some cardiac symptoms. She complaining of having some ski beats and palpitations, she also complaining of having some chest pains. Those pains can happen in different situations could happen at rest and usually happen at rest, she described inches needle in her chest, sometimes however pressure. He does not happen when he walks. There is no shortness of breath as stated with the sensation. Interestingly she wear Holter monitor and Holter monitor showed some ventricle ectopy with periods of trigeminy. She described to have those symptoms while she was having bigeminy. Also recently she was in the [facility] because of those symptoms ectocardia gram was done apparently everything was fine however she was sent to [facility] for future evaluation. Stress test was done which showed preserved ejection fraction with no evidence of ischemia. She was discharged home. However she still experiencing the same symptoms this when she was sent to [place] for evaluation. She does take nitroglycerin for pain and that helps.  Past Medical History:  Diagnosis Date  . Adjustment disorder   . Anxiety disorder   . CAD (coronary artery disease)   . Chest pain   . Colon cancer ([place])   . Depression   . Dysphonia   . Dysthymic disorder   . Gastroesophageal reflux disease   .  Hyperlipidemia   . Insomnia   . MRSA (methicillin resistant Staphylococcus aureus)   . Neuropathy   . Obesity   . Pneumonia   . Pre-syncope     Past Surgical History:  Procedure Laterality Date  . ABDOMINAL HYSTERECTOMY    . BLADDER SURGERY    . CATARACT EXTRACTION, BILATERAL    . COLON SURGERY     Due to colon cancer  . PORT-A-CATH REMOVAL    . TUBAL LIGATION      Current Medications: Current Meds  Medication Sig  . aspirin EC 81 MG tablet Take 81 mg by mouth daily.  Marland Kitchen CALCIUM PO Take 1 tablet by mouth daily.  . Cholecalciferol (VITAMIN D3) 2000 units TABS Take 2,000 Units by mouth daily.  Marland Kitchen dexlansoprazole (DEXILANT) 60 MG capsule Take 60 mg by mouth daily.  . DULoxetine (CYMBALTA) 60 MG capsule Take 60 mg by mouth daily.  . fluticasone (FLONASE) 50 MCG/ACT nasal spray Place 1 spray into both nostrils 2 (two) times daily as needed for allergies or rhinitis.   Marland Kitchen gabapentin (NEURONTIN) 400 MG capsule Take 800-1,200 mg by mouth See admin instructions. 800 mg in the morning then 800 mg midday then 1,200 mg at bedtime  . Multiple Vitamins-Minerals (CENTRUM SILVER 50+WOMEN) TABS Take 1 tablet by mouth daily.  . nitroGLYCERIN (NITROSTAT) 0.4 MG SL tablet Place 0.4 mg under the tongue every 5 (five) minutes x 3 doses as needed for chest pain.   Marland Kitchen  rosuvastatin (CRESTOR) 20 MG tablet Take 20 mg by mouth at bedtime.  . triamcinolone cream (KENALOG) 0.1 % Apply 1 application topically See admin instructions. TO AFFECTED AREA(S) DAILY AS DIRECTED     Allergies:   Penicillins; Codeine; Guaifenesin & derivatives; Latex; Pseudoephedrine; Sulfa antibiotics; and Tape   Social History   Social History  . Marital status: Widowed    Spouse name: N/A  . Number of children: N/A  . Years of education: N/A   Social History Main Topics  . Smoking status: Never Smoker  . Smokeless tobacco: Never Used  . Alcohol use No  . Drug use: No  . Sexual activity: Not Asked   Other Topics Concern    . None   Social History Narrative  . None     Family History: The patient's family history includes CAD in her mother; COPD in her father; CVA in her brother; Heart failure in her father; Hypertension in her mother; Kidney disease in her mother. ROS:   Please see the history of present illness.    All 14 point review of systems negative except as described per history of present illness.  EKGs/Labs/Other Studies Reviewed:      Recent Labs: 12/19/2016: ALT 19 12/20/2016: BUN 8; Creatinine, Ser 0.89; Hemoglobin 14.0; Platelets 145; Potassium 4.6; Sodium 138  Recent Lipid Panel No results found for: CHOL, TRIG, HDL, CHOLHDL, VLDL, LDLCALC, LDLDIRECT  Physical Exam:    VS:  BP 140/90   Pulse 84 Comment: Irregular  Resp 20   Ht 5' (1.524 m)   Wt 158 lb 1.9 oz (71.7 kg)   BMI 30.88 kg/m     Wt Readings from Last 3 Encounters:  01/20/17 158 lb 1.9 oz (71.7 kg)  12/20/16 156 lb 12.8 oz (71.1 kg)     GEN:  Well nourished, well developed in no acute distress HEENT: Normal NECK: No JVD; No carotid bruits LYMPHATICS: No lymphadenopathy CARDIAC: RRR, no murmurs, no rubs, no gallops RESPIRATORY:  Clear to auscultation without rales, wheezing or rhonchi  ABDOMEN: Soft, non-tender, non-distended MUSCULOSKELETAL:  No edema; No deformity  SKIN: Warm and dry NEUROLOGIC:  Alert and oriented x 3 PSYCHIATRIC:  Normal affect   ASSESSMENT:    1. Coronary artery disease involving native coronary artery of native heart without angina pectoris   2. Palpitations   3. Mixed hyperlipidemia    PLAN:    In order of problems listed above:  1. Coronary artery disease: She does have history of 40% blockage in one of the artery. Recent stress test done at [facility] was good in quality and negative for exercise-induced ischemia. Obviously I will try to retrieve data. She is already on aspirin as well as statin which I will continue. She does use Naprosyn when necessary with good response and  asked her to continue. 2. Palpitations: Symptoms that she described correlated with PVCs and ventricular bigeminy seen on the monitor. I think by suppressing this extra beats will improve her symptomatology. I will ask her to start taking 20 5. of Toprol twice daily. If she does have some coronary artery disease that was missed on stress testing it will be also help with metoprolol. 3. Dyslipidemia: We'll continue present management. I will call primary care physician to get fasting profile.  Lady with some atypical symptoms. She does have some atypical chest pain. Holter monitor shows some frequent ventricular ectopy with bigeminy and trigeminy that she felt a my hope is that by giving her beta blocker  unable to separate restless extra beats and make her symptoms better I will see her back in one month.   Medication Adjustments/Labs and Tests Ordered: Current medicines are reviewed at length with the patient today.  Concerns regarding medicines are outlined above.  No orders of the defined types were placed in this encounter.  No orders of the defined types were placed in this encounter.   Signed, Park Liter, MD, [facility]. 01/20/2017 2:52 PM    [facility]

## 2017-01-31 ENCOUNTER — Telehealth: Payer: Self-pay | Admitting: Cardiology

## 2017-01-31 NOTE — Telephone Encounter (Signed)
Bee having some tingling in her arms and nausea over the week. She is concerned and would like some advice. Some SOB but nothing major. These episodes happen at least once weekly. Please call her.

## 2017-01-31 NOTE — Telephone Encounter (Signed)
S/w pt and discussed symptoms. Pt denies CP and SHOB at this time. I have offered a sooner appointment with Dr. Agustin Cree. Pt states she is going to reach out to her PCP first, and should they not be able to see her soon, we will arrange for her to come see Dr. Agustin Cree sooner.

## 2017-02-04 ENCOUNTER — Encounter: Payer: Self-pay | Admitting: Cardiology

## 2017-02-04 ENCOUNTER — Ambulatory Visit (INDEPENDENT_AMBULATORY_CARE_PROVIDER_SITE_OTHER): Payer: Medicare HMO | Admitting: Cardiology

## 2017-02-04 VITALS — BP 120/82 | HR 61 | Ht 60.0 in | Wt 160.8 lb

## 2017-02-04 DIAGNOSIS — I2510 Atherosclerotic heart disease of native coronary artery without angina pectoris: Secondary | ICD-10-CM | POA: Diagnosis not present

## 2017-02-04 DIAGNOSIS — R002 Palpitations: Secondary | ICD-10-CM

## 2017-02-04 DIAGNOSIS — E782 Mixed hyperlipidemia: Secondary | ICD-10-CM

## 2017-02-04 DIAGNOSIS — R0789 Other chest pain: Secondary | ICD-10-CM

## 2017-02-04 NOTE — Progress Notes (Signed)
Cardiology Office Note:    Date:  02/04/2017   ID:  Arianne, Klinge [birth year], MRN [id number]  PCP:  Charlynn Court, NP  Cardiologist:  Jenne Campus, MD    Referring MD: Charlynn Court, NP   Chief Complaint  Patient presents with  . Follow-up    flup per Lucita Lora to discuss CP and tingling in left arm   . Chest Pain  . Shortness of Breath  . Dizziness  She still having chest pain  History of Present Illness:    Kamdyn Covel is a 61 y.o. female  history reports having chest pain. This pain happened different situations including with some exercise. Pain she described as sharp located in the left side for chest toxicity which shortness of breath not associated with sweating. Duration of the pain is typically a few minutes. She did have a stress is done recently which was negative. I tried to give her some beta blocker hoping that by doing this I be able to suppress some extrasystole but she has and improve her symptoms but that is not the case. He had a long discussion about what to do with this situation I told her we can try different medications I also was talking to her about potentially taking ranolazine and see if that improves, there is minimal tenderness in the area of pain I was hoping to give her some nonsteroidal anti-inflammatory medication and improved the situation by doing that I however she clearly stated that she would like to have definite test cardiac catheterization to clarify the problem is. She said she is tired of hurting in that place. She is afraid to do anything because she is why she is, however heart attack. About 15 years ago she had cardiac catheterization done which showed 40% blockage in one of the artery. We discussed risks and benefits as well as alternatives and she agreed to proceed with cardiac catheterization. Patient said that she had very bad experience at [facility] when she had her previous cardiac catheterization and she prefers to have it  done at [facility].  Past Medical History:  Diagnosis Date  . Adjustment disorder   . Anxiety disorder   . CAD (coronary artery disease)   . Chest pain   . Colon cancer ([place])   . Depression   . Dysphonia   . Dysthymic disorder   . Gastroesophageal reflux disease   . Hyperlipidemia   . Insomnia   . MRSA (methicillin resistant Staphylococcus aureus)   . Neuropathy   . Obesity   . Pneumonia   . Pre-syncope     Past Surgical History:  Procedure Laterality Date  . ABDOMINAL HYSTERECTOMY    . BLADDER SURGERY    . CATARACT EXTRACTION, BILATERAL    . COLON SURGERY     Due to colon cancer  . PORT-A-CATH REMOVAL    . TUBAL LIGATION      Current Medications: Current Meds  Medication Sig  . aspirin EC 81 MG tablet Take 81 mg by mouth daily.  Marland Kitchen CALCIUM PO Take 1 tablet by mouth daily.  . Cholecalciferol (VITAMIN D3) 2000 units TABS Take 2,000 Units by mouth daily.  Marland Kitchen dexlansoprazole (DEXILANT) 60 MG capsule Take 60 mg by mouth daily.  . DULoxetine (CYMBALTA) 60 MG capsule Take 60 mg by mouth daily.  . fluticasone (FLONASE) 50 MCG/ACT nasal spray Place 1 spray into both nostrils 2 (two) times daily as needed for allergies or rhinitis.   Marland Kitchen  gabapentin (NEURONTIN) 400 MG capsule Take 800-1,200 mg by mouth See admin instructions. 800 mg in the morning then 800 mg midday then 1,200 mg at bedtime  . metoprolol succinate (TOPROL XL) 25 MG 24 hr tablet Take 1 tablet (25 mg total) by mouth 2 (two) times daily.  . Multiple Vitamins-Minerals (CENTRUM SILVER 50+WOMEN) TABS Take 1 tablet by mouth daily.  . nitroGLYCERIN (NITROSTAT) 0.4 MG SL tablet Place 0.4 mg under the tongue every 5 (five) minutes x 3 doses as needed for chest pain.   . rosuvastatin (CRESTOR) 20 MG tablet Take 20 mg by mouth at bedtime.  . triamcinolone cream (KENALOG) 0.1 % Apply 1 application topically See admin instructions. TO AFFECTED AREA(S) DAILY AS DIRECTED     Allergies:   Penicillins; Codeine;  Guaifenesin & derivatives; Latex; Pseudoephedrine; Sulfa antibiotics; and Tape   Social History   Social History  . Marital status: Widowed    Spouse name: N/A  . Number of children: N/A  . Years of education: N/A   Social History Main Topics  . Smoking status: Never Smoker  . Smokeless tobacco: Never Used  . Alcohol use No  . Drug use: No  . Sexual activity: Not Asked   Other Topics Concern  . None   Social History Narrative  . None     Family History: The patient's family history includes CAD in her mother; COPD in her father; CVA in her brother; Heart failure in her father; Hypertension in her mother; Kidney disease in her mother. ROS:   Please see the history of present illness.    All 14 point review of systems negative except as described per history of present illness  EKGs/Labs/Other Studies Reviewed:      Recent Labs: 12/19/2016: ALT 19 12/20/2016: BUN 8; Creatinine, Ser 0.89; Hemoglobin 14.0; Platelets 145; Potassium 4.6; Sodium 138  Recent Lipid Panel No results found for: CHOL, TRIG, HDL, CHOLHDL, VLDL, LDLCALC, LDLDIRECT  Physical Exam:    VS:  BP 120/82 (BP Location: Right Arm, Patient Position: Sitting)   Pulse 61   Ht 5' (1.524 m)   Wt 160 lb 12.8 oz (72.9 kg)   SpO2 96%   BMI 31.40 kg/m     Wt Readings from Last 3 Encounters:  02/04/17 160 lb 12.8 oz (72.9 kg)  01/20/17 158 lb 1.9 oz (71.7 kg)  12/20/16 156 lb 12.8 oz (71.1 kg)     GEN:  Well nourished, well developed in no acute distress HEENT: Normal NECK: No JVD; No carotid bruits LYMPHATICS: No lymphadenopathy CARDIAC: RRR, no murmurs, no rubs, no gallops RESPIRATORY:  Clear to auscultation without rales, wheezing or rhonchi  ABDOMEN: Soft, non-tender, non-distended MUSCULOSKELETAL:  No edema; No deformity  SKIN: Warm and dry LOWER EXTREMITIES: no swelling NEUROLOGIC:  Alert and oriented x 3 PSYCHIATRIC:  Normal affect   ASSESSMENT:    1. Coronary artery disease involving native  coronary artery of native heart without angina pectoris   2. Atypical chest pain   3. Mixed hyperlipidemia   4. Palpitations    PLAN:    In order of problems listed above:  1. Coronary artery disease with 40% stenosis some symptoms that are worsening. We talked about options and we decided to proceed with cardiac catheterization as per her wishes. In the meantime I will give for ranolazine I will continue rest of her medications. 2. Mixed dyslipidemia we'll continue with statin. 3. Palpitations: We'll continue with metoprolol.   Medication Adjustments/Labs and Tests Ordered: Current  medicines are reviewed at length with the patient today.  Concerns regarding medicines are outlined above.  No orders of the defined types were placed in this encounter.  Medication changes: No orders of the defined types were placed in this encounter.   Signed, Park Liter, MD, [facility] 02/04/2017 10:54 AM    [place]

## 2017-02-04 NOTE — Patient Instructions (Signed)
Medication Instructions:  Your physician recommends that you continue on your current medications as directed. Please refer to the Current Medication list given to you today.  Labwork: None Orderd  Testing/Procedures: A Left Heart Cardiac will be scheduled at [facility]. We will contact you about your appointment, and pre-cath orders  Follow-Up: Your physician recommends that you schedule a follow-up appointment in: 1 month with Dr. Agustin Cree  Any Other Special Instructions Will Be Listed Below (If Applicable).     If you need a refill on your cardiac medications before your next appointment, please call your pharmacy.

## 2017-02-09 DIAGNOSIS — I2510 Atherosclerotic heart disease of native coronary artery without angina pectoris: Secondary | ICD-10-CM | POA: Insufficient documentation

## 2017-02-15 ENCOUNTER — Telehealth: Payer: Self-pay | Admitting: Cardiology

## 2017-02-15 MED ORDER — RANOLAZINE ER 500 MG PO TB12: 500.0000 mg | ORAL_TABLET | Freq: Two times a day (BID) | ORAL | 4 refills | Status: DC

## 2017-02-15 NOTE — Telephone Encounter (Signed)
Has questions about a medication that was supposed to be called in last time she was here

## 2017-02-15 NOTE — Telephone Encounter (Signed)
RX for Ranexa 500 mg was sent in for the pt. She states she is doing well since her LHC. Pt denies any complications at this time.

## 2017-02-17 ENCOUNTER — Telehealth: Payer: Self-pay

## 2017-02-17 NOTE — Telephone Encounter (Signed)
Attempted to call pt for TCM f/u.

## 2017-02-21 ENCOUNTER — Ambulatory Visit: Payer: Medicare HMO | Admitting: Cardiology

## 2017-03-10 ENCOUNTER — Ambulatory Visit (INDEPENDENT_AMBULATORY_CARE_PROVIDER_SITE_OTHER): Payer: Medicare HMO | Admitting: Cardiology

## 2017-03-10 ENCOUNTER — Encounter: Payer: Self-pay | Admitting: Cardiology

## 2017-03-10 VITALS — BP 120/64 | HR 88 | Resp 12 | Ht 60.0 in | Wt 163.0 lb

## 2017-03-10 DIAGNOSIS — R002 Palpitations: Secondary | ICD-10-CM | POA: Diagnosis not present

## 2017-03-10 DIAGNOSIS — R0789 Other chest pain: Secondary | ICD-10-CM | POA: Diagnosis not present

## 2017-03-10 DIAGNOSIS — E782 Mixed hyperlipidemia: Secondary | ICD-10-CM | POA: Diagnosis not present

## 2017-03-10 NOTE — Progress Notes (Signed)
Cardiology Office Note:    Date:  03/10/2017   ID:  Caral, Whan 06/16/1955, MRN [id number]  PCP:  Charlynn Court, NP  Cardiologist:  Jenne Campus, MD    Referring MD: Charlynn Court, NP   Chief Complaint  Patient presents with  . Follow up Cath  still having some chest pain  History of Present Illness:    [name] is a 61 y.o. female  With atypical chest pain. Recently she had cardiac catheterization try to clarify the nature of her problem. Luckily cardiac catheterization was perfectly normal. We spent cradle of time tal reason for her pain and I told her she needed to return to her pary care physician in her gallbladder and stomach need to be investigated. I told her also that our focus need to be now on reduction of her risk factors for coronary artery disease. She is already on cholesterol medication which I will continue I advised to continue with aspirin.I asked her to stop ranolazine.  Past Medical History:  Diagnosis Date  . Adjustment disorder   . Anxiety disorder   . CAD (coronary artery disease)   . Chest pain   . Colon cancer ([place])   . Depression   . Dysphonia   . Dysthymic disorder   . Gastroesophageal reflux disease   . Hyperlipidemia   . Insomnia   . MRSA (methicillin resistant Staphylococcus aureus)   . Neuropathy   . Obesity   . Pneumonia   . Pre-syncope     Past Surgical History:  Procedure Laterality Date  . ABDOMINAL HYSTERECTOMY    . BLADDER SURGERY    . CATARACT EXTRACTION, BILATERAL    . COLON SURGERY     Due to colon cancer  . PORT-A-CATH REMOVAL    . TUBAL LIGATION      Current Medications: Current Meds  Medication Sig  . aspirin EC 81 MG tablet Take 81 mg by mouth daily.  Marland Kitchen CALCIUM PO Take 1 tablet by mouth daily.  . Cholecalciferol (VITAMIN D3) 2000 units TABS Take 2,000 Units by mouth daily.  Marland Kitchen dexlansoprazole (DEXILANT) 60 MG capsule Take 60 mg by mouth daily.  . DULoxetine (CYMBALTA) 60 MG capsule Take 60 mg by  mouth daily.  . fluticasone (FLONASE) 50 MCG/ACT nasal spray Place 1 spray into both nostrils 2 (two) times daily as needed for allergies or rhinitis.   Marland Kitchen gabapentin (NEURONTIN) 400 MG capsule Take 800-1,200 mg by mouth See admin instructions. 800 mg in the morning then 800 mg midday then 1,200 mg at bedtime  . metoprolol succinate (TOPROL XL) 25 MG 24 hr tablet Take 1 tablet (25 mg total) by mouth 2 (two) times daily.  . Multiple Vitamins-Minerals (CENTRUM SILVER 50+WOMEN) TABS Take 1 tablet by mouth daily.  . nitroGLYCERIN (NITROSTAT) 0.4 MG SL tablet Place 0.4 mg under the tongue every 5 (five) minutes x 3 doses as needed for chest pain.   . ranolazine (RANEXA) 500 MG 12 hr tablet Take 1 tablet (500 mg total) by mouth 2 (two) times daily.  . rosuvastatin (CRESTOR) 20 MG tablet Take 20 mg by mouth at bedtime.  . triamcinolone cream (KENALOG) 0.1 % Apply 1 application topically See admin instructions. TO AFFECTED AREA(S) DAILY AS DIRECTED     Allergies:   Penicillins; Codeine; Guaifenesin & derivatives; Latex; Pseudoephedrine; Sulfa antibiotics; and Tape   Social History   Social History  . Marital status: Widowed    Spouse name: N/A  . Number of  children: N/A  . Years of education: N/A   Social History Main Topics  . Smoking status: Never Smoker  . Smokeless tobacco: Never Used  . Alcohol use No  . Drug use: No  . Sexual activity: Not Asked   Other Topics Concern  . None   Social History Narrative  . None     Family History: The patient's family history includes CAD in her mother; COPD in her father; CVA in her brother; Heart failure in her father; Hypertension in her mother; Kidney disease in her mother. ROS:   Please see the history of present illness.    All 14 point review of systems negative except as described per history of present illness  EKGs/Labs/Other Studies Reviewed:      Recent Labs: 12/19/2016: ALT 19 12/20/2016: BUN 8; Creatinine, Ser 0.89; Hemoglobin  14.0; Platelets 145; Potassium 4.6; Sodium 138  Recent Lipid Panel No results found for: CHOL, TRIG, HDL, CHOLHDL, VLDL, LDLCALC, LDLDIRECT  Physical Exam:    VS:  BP 120/64   Pulse 88   Resp 12   Ht 5' (1.524 m)   Wt 163 lb (73.9 kg)   BMI 31.83 kg/m     Wt Readings from Last 3 Encounters:  03/10/17 163 lb (73.9 kg)  02/04/17 160 lb 12.8 oz (72.9 kg)  01/20/17 158 lb 1.9 oz (71.7 kg)     GEN:  Well nourished, well developed in no acute distress HEENT: Normal NECK: No JVD; No carotid bruits LYMPHATICS: No lymphadenopathy CARDIAC: RRR, no murmurs, no rubs, no gallops RESPIRATORY:  Clear to auscultation without rales, wheezing or rhonchi  ABDOMEN: Soft, non-tender, non-distended MUSCULOSKELETAL:  No edema; No deformity  SKIN: Warm and dry LOWER EXTREMITIES: no swelling NEUROLOGIC:  Alert and oriented x 3 PSYCHIATRIC:  Normal affect   ASSESSMENT:    1. Atypical chest pain   2. Mixed hyperlipidemia   3. Palpitations    PLAN:    In order of problems listed above:  1. Typical chest pain:ardiac catheterization done few days ago was normal 2. Mixed dyslipidemia: Continue with statin. 3. Palpitations: Denies having any   Medication Adjustments/Labs and Tests Ordered: Current medicines are reviewed at length with the patient today.  Concerns regarding medicines are outlined above.  No orders of the defined types were placed in this encounter.  Medication changes: No orders of the defined types were placed in this encounter.   Signed, Park Liter, MD, [facility] 03/10/2017 11:02 AM    [name]

## 2017-03-10 NOTE — Addendum Note (Signed)
Addended by: Kathyrn Sheriff on: 03/10/2017 11:09 AM   Modules accepted: Orders

## 2017-03-10 NOTE — Patient Instructions (Addendum)
Medication Instructions:  Your physician recommends that you continue on your current medications as directed. Please refer to the Current Medication list given to you today.  Labwork: None    Testing/Procedures: None   Follow-Up: Your physician wants you to follow-up in: 6 months. You will receive a reminder letter in the mail two months in advance. If you don't receive a letter, please call our office to schedule the follow-up appointment.  Any Other Special Instructions Will Be Listed Below (If Applicable).  Please note that any paperwork needing to be filled out by the provider will need to be addressed at the front desk prior to seeing the provider. Please note that any paperwork FMLA, Disability or other documents regarding health condition is subject to a $25.00 charge that must be received prior to completion of paperwork in the form of a money order or check.    If you need a refill on your cardiac medications before your next appointment, please call your pharmacy.  

## 2017-05-26 HISTORY — PX: ESOPHAGOGASTRODUODENOSCOPY: SHX1529

## 2017-07-08 DIAGNOSIS — C189 Malignant neoplasm of colon, unspecified: Secondary | ICD-10-CM | POA: Diagnosis not present

## 2017-12-19 ENCOUNTER — Telehealth: Payer: Self-pay

## 2017-12-19 MED ORDER — DEXLANSOPRAZOLE 60 MG PO CPDR: 60.0000 mg | DELAYED_RELEASE_CAPSULE | Freq: Every day | ORAL | 3 refills | Status: DC

## 2017-12-19 NOTE — Telephone Encounter (Signed)
Sent refill to patients pharmacy. 

## 2018-01-09 ENCOUNTER — Ambulatory Visit: Payer: Medicare HMO | Admitting: Cardiology

## 2018-01-09 ENCOUNTER — Encounter: Payer: Self-pay | Admitting: Cardiology

## 2018-01-09 VITALS — BP 116/66 | HR 62 | Ht 60.0 in | Wt 157.0 lb

## 2018-01-09 DIAGNOSIS — K760 Fatty (change of) liver, not elsewhere classified: Secondary | ICD-10-CM | POA: Insufficient documentation

## 2018-01-09 DIAGNOSIS — K449 Diaphragmatic hernia without obstruction or gangrene: Secondary | ICD-10-CM

## 2018-01-09 DIAGNOSIS — R002 Palpitations: Secondary | ICD-10-CM | POA: Diagnosis not present

## 2018-01-09 DIAGNOSIS — C801 Malignant (primary) neoplasm, unspecified: Secondary | ICD-10-CM | POA: Insufficient documentation

## 2018-01-09 DIAGNOSIS — F32A Depression, unspecified: Secondary | ICD-10-CM

## 2018-01-09 DIAGNOSIS — E782 Mixed hyperlipidemia: Secondary | ICD-10-CM | POA: Diagnosis not present

## 2018-01-09 DIAGNOSIS — T8859XA Other complications of anesthesia, initial encounter: Secondary | ICD-10-CM | POA: Insufficient documentation

## 2018-01-09 DIAGNOSIS — Z87442 Personal history of urinary calculi: Secondary | ICD-10-CM

## 2018-01-09 DIAGNOSIS — F329 Major depressive disorder, single episode, unspecified: Secondary | ICD-10-CM | POA: Insufficient documentation

## 2018-01-09 DIAGNOSIS — K219 Gastro-esophageal reflux disease without esophagitis: Secondary | ICD-10-CM

## 2018-01-09 DIAGNOSIS — F419 Anxiety disorder, unspecified: Secondary | ICD-10-CM

## 2018-01-09 DIAGNOSIS — J45909 Unspecified asthma, uncomplicated: Secondary | ICD-10-CM | POA: Insufficient documentation

## 2018-01-09 DIAGNOSIS — A4902 Methicillin resistant Staphylococcus aureus infection, unspecified site: Secondary | ICD-10-CM

## 2018-01-09 DIAGNOSIS — T4145XA Adverse effect of unspecified anesthetic, initial encounter: Secondary | ICD-10-CM | POA: Insufficient documentation

## 2018-01-09 DIAGNOSIS — R7303 Prediabetes: Secondary | ICD-10-CM

## 2018-01-09 DIAGNOSIS — G4730 Sleep apnea, unspecified: Secondary | ICD-10-CM | POA: Insufficient documentation

## 2018-01-09 DIAGNOSIS — R0789 Other chest pain: Secondary | ICD-10-CM

## 2018-01-09 DIAGNOSIS — M1990 Unspecified osteoarthritis, unspecified site: Secondary | ICD-10-CM | POA: Insufficient documentation

## 2018-01-09 HISTORY — DX: Methicillin resistant Staphylococcus aureus infection, unspecified site: A49.02

## 2018-01-09 HISTORY — DX: Personal history of urinary calculi: Z87.442

## 2018-01-09 HISTORY — DX: Anxiety disorder, unspecified: F41.9

## 2018-01-09 HISTORY — DX: Prediabetes: R73.03

## 2018-01-09 HISTORY — DX: Malignant (primary) neoplasm, unspecified: C80.1

## 2018-01-09 HISTORY — DX: Other complications of anesthesia, initial encounter: T88.59XA

## 2018-01-09 HISTORY — DX: Gastro-esophageal reflux disease without esophagitis: K21.9

## 2018-01-09 HISTORY — DX: Fatty (change of) liver, not elsewhere classified: K76.0

## 2018-01-09 HISTORY — DX: Depression, unspecified: F32.A

## 2018-01-09 HISTORY — DX: Diaphragmatic hernia without obstruction or gangrene: K44.9

## 2018-01-09 NOTE — Patient Instructions (Signed)
Medication Instructions:  Your physician recommends that you continue on your current medications as directed. Please refer to the Current Medication list given to you today.  Labwork: None  Testing/Procedures: None  Follow-Up: Your physician recommends that you schedule a follow-up appointment in: 6 months  Any Other Special Instructions Will Be Listed Below (If Applicable).     If you need a refill on your cardiac medications before your next appointment, please call your pharmacy.   [facility]  Melissa Pulido A, RN, BSN  

## 2018-01-09 NOTE — Progress Notes (Signed)
Cardiology Office Note:    Date:  01/09/2018   ID:  [name] March 19, 1956, MRN [id number]  PCP:  Megan Court, NP  Cardiologist:  Megan Campus, MD    Referring MD: Megan Court, NP   No chief complaint on file. Doing well  History of Present Illness:    [name] is a 62 y.o. female  with atypical chest pain.  She did have a cardiac catheterization which showed normal coronaries.  However we will concentrate now on modification of her risk factors for coronary artery disease.  She does have prolonged diabetes she lost some weight eating better and also exercising some.  I told her this extremely beneficial and advised her to continue.  Denies have any chest pain tightness squeezing pressure burning chest.  He again try to be active.   Past Medical History:  Diagnosis Date  . Adjustment disorder   . Anxiety disorder   . CAD (coronary artery disease)   . Chest pain   . Colon cancer ([place])   . Depression   . Dysphonia   . Dysthymic disorder   . Gastroesophageal reflux disease   . Hyperlipidemia   . Insomnia   . MRSA (methicillin resistant Staphylococcus aureus)   . Neuropathy   . Obesity   . Pneumonia   . Pre-syncope     Past Surgical History:  Procedure Laterality Date  . ABDOMINAL HYSTERECTOMY    . BLADDER SURGERY    . CATARACT EXTRACTION, BILATERAL    . COLON SURGERY     Due to colon cancer  . PORT-A-CATH REMOVAL    . TUBAL LIGATION      Current Medications: Current Meds  Medication Sig  . aspirin EC 81 MG tablet Take 81 mg by mouth daily.  Marland Kitchen CALCIUM PO Take 1 tablet by mouth daily.  . Cholecalciferol (VITAMIN D3) 2000 units TABS Take 2,000 Units by mouth daily.  Marland Kitchen dexlansoprazole (DEXILANT) 60 MG capsule Take 1 capsule (60 mg total) by mouth daily.  . DULoxetine (CYMBALTA) 60 MG capsule Take 60 mg by mouth daily.  . fluticasone (FLONASE) 50 MCG/ACT nasal spray Place 1 spray into both nostrils 2 (two) times daily as needed for allergies or  rhinitis.   Marland Kitchen gabapentin (NEURONTIN) 400 MG capsule Take 800-1,200 mg by mouth See admin instructions. 800 mg in the morning then 800 mg midday then 1,200 mg at bedtime  . metoprolol succinate (TOPROL XL) 25 MG 24 hr tablet Take 1 tablet (25 mg total) by mouth 2 (two) times daily.  . Multiple Vitamins-Minerals (CENTRUM SILVER 50+WOMEN) TABS Take 1 tablet by mouth daily.  . nitroGLYCERIN (NITROSTAT) 0.4 MG SL tablet Place 0.4 mg under the tongue every 5 (five) minutes x 3 doses as needed for chest pain.   . rosuvastatin (CRESTOR) 20 MG tablet Take 20 mg by mouth at bedtime.  . triamcinolone cream (KENALOG) 0.1 % Apply 1 application topically See admin instructions. TO AFFECTED AREA(S) DAILY AS DIRECTED     Allergies:   Penicillins; Codeine; Guaifenesin & derivatives; Latex; Other; Pseudoephedrine; Sulfa antibiotics; Sulfasalazine; and Tape   Social History   Socioeconomic History  . Marital status: Widowed    Spouse name: Not on file  . Number of children: Not on file  . Years of education: Not on file  . Highest education level: Not on file  Occupational History  . Not on file  Social Needs  . Financial resource strain: Not on file  . Food insecurity:  Worry: Not on file    Inability: Not on file  . Transportation needs:    Medical: Not on file    Non-medical: Not on file  Tobacco Use  . Smoking status: Never Smoker  . Smokeless tobacco: Never Used  Substance and Sexual Activity  . Alcohol use: No  . Drug use: No  . Sexual activity: Not on file  Lifestyle  . Physical activity:    Days per week: Not on file    Minutes per session: Not on file  . Stress: Not on file  Relationships  . Social connections:    Talks on phone: Not on file    Gets together: Not on file    Attends religious service: Not on file    Active member of club or organization: Not on file    Attends meetings of clubs or organizations: Not on file    Relationship status: Not on file  Other Topics  Concern  . Not on file  Social History Narrative  . Not on file     Family History: The patient's family history includes CAD in her mother; COPD in her father; CVA in her brother; Heart failure in her father; Hypertension in her mother; Kidney disease in her mother. ROS:   Please see the history of present illness.    All 14 point review of systems negative except as described per history of present illness  EKGs/Labs/Other Studies Reviewed:      Recent Labs: No results found for requested labs within last 8760 hours.  Recent Lipid Panel No results found for: CHOL, TRIG, HDL, CHOLHDL, VLDL, LDLCALC, LDLDIRECT  Physical Exam:    VS:  BP 116/66 (BP Location: Right Arm, Patient Position: Sitting, Cuff Size: Normal)   Pulse 62   Ht 5' (1.524 m)   Wt 157 lb (71.2 kg)   SpO2 97%   BMI 30.66 kg/m     Wt Readings from Last 3 Encounters:  01/09/18 157 lb (71.2 kg)  03/10/17 163 lb (73.9 kg)  02/04/17 160 lb 12.8 oz (72.9 kg)     GEN:  Well nourished, well developed in no acute distress HEENT: Normal NECK: No JVD; No carotid bruits LYMPHATICS: No lymphadenopathy CARDIAC: RRR, no murmurs, no rubs, no gallops RESPIRATORY:  Clear to auscultation without rales, wheezing or rhonchi  ABDOMEN: Soft, non-tender, non-distended MUSCULOSKELETAL:  No edema; No deformity  SKIN: Warm and dry LOWER EXTREMITIES: no swelling NEUROLOGIC:  Alert and oriented x 3 PSYCHIATRIC:  Normal affect   ASSESSMENT:    1. Atypical chest pain   2. Borderline diabetes   3. Palpitations   4. Mixed hyperlipidemia    PLAN:    In order of problems listed above:  1. Atypical chest pain: Denies having any. 2. Following diabetes: Getting better with exercises and weight loss which should help. 3. Palpitations: Denies having any 4. Dyslipidemia: On statin which I will continue.   Medication Adjustments/Labs and Tests Ordered: Current medicines are reviewed at length with the patient today.  Concerns  regarding medicines are outlined above.  No orders of the defined types were placed in this encounter.  Medication changes: No orders of the defined types were placed in this encounter.   Signed, Park Liter, MD, [facility] 01/09/2018 1:28 PM    [facility]

## 2018-05-05 ENCOUNTER — Other Ambulatory Visit: Payer: Self-pay | Admitting: Gastroenterology

## 2018-05-05 ENCOUNTER — Telehealth: Payer: Self-pay | Admitting: Gastroenterology

## 2018-05-08 NOTE — Telephone Encounter (Signed)
Called patient and left a message for her to return phone call, she needs to make an appointment for additional refills.

## 2018-05-09 ENCOUNTER — Encounter: Payer: Self-pay | Admitting: Gastroenterology

## 2018-05-09 MED ORDER — DEXLANSOPRAZOLE 60 MG PO CPDR: 60.0000 mg | DELAYED_RELEASE_CAPSULE | Freq: Every day | ORAL | 0 refills | Status: DC

## 2018-05-09 NOTE — Telephone Encounter (Signed)
Sent one refill in until patients appointment.

## 2018-05-09 NOTE — Telephone Encounter (Signed)
Pt call need refill until sched appt 05/19/2018 @ 8:30am

## 2018-05-18 ENCOUNTER — Encounter: Payer: Self-pay | Admitting: Gastroenterology

## 2018-05-19 ENCOUNTER — Ambulatory Visit: Payer: Medicare HMO | Admitting: Gastroenterology

## 2018-05-19 ENCOUNTER — Encounter: Payer: Self-pay | Admitting: Gastroenterology

## 2018-05-19 VITALS — BP 126/74 | HR 68 | Ht 60.0 in | Wt 163.0 lb

## 2018-05-19 DIAGNOSIS — K2100 Gastro-esophageal reflux disease with esophagitis, without bleeding: Secondary | ICD-10-CM

## 2018-05-19 DIAGNOSIS — Z85038 Personal history of other malignant neoplasm of large intestine: Secondary | ICD-10-CM

## 2018-05-19 MED ORDER — FAMOTIDINE 20 MG PO TABS: 20.0000 mg | ORAL_TABLET | Freq: Every day | ORAL | 11 refills | Status: DC

## 2018-05-19 MED ORDER — DEXLANSOPRAZOLE 60 MG PO CPDR: 60.0000 mg | DELAYED_RELEASE_CAPSULE | Freq: Every day | ORAL | 3 refills | Status: DC

## 2018-05-19 NOTE — Progress Notes (Signed)
Chief Complaint: Follow-up  Referring Provider:  Charlynn Court, NP      ASSESSMENT AND PLAN;   #1. GERD with large [place]   #2.  H/O colon cancer (stage III, [id number]) s/p lap right hemicolectomy 2012.  H/O colonic polyps 03/2015.  Next colonoscopy due 03/2020.  Being followed by oncology.  Plan: - Dexilant 60mg  po qd #90, 4 refiils. - Pepcid 20mg  po qhs. - Discussed long-term side effects of PPIs.  She would like to hold off on surgical repair of hiatal hernia. - Follow-up in 1 year.  Earlier, if with any problems. HPI:    [name] is a 62 y.o. female  For follow-up No GI complaints except occasional heartburn especially at night Here for medication refill No nausea, vomiting, regurgitation, odynophagia or dysphagia.  No significant diarrhea or constipation.  There is no melena or hematochezia. No unintentional weight loss.   Past GI procedures: -EGD 05/26/2017 8 cm hiatal hernia with small paraesophageal component, Schatzki ring status post esophageal dilatation 40 Fr -Colonoscopy 04/09/2015 ([facility])-6 mm sessile polyp status post polypectomy, biopsies tubular adenoma, right hemicolectomy, mild pancolonic diverticulosis. Past Medical History:  Diagnosis Date  . Adjustment disorder   . Anxiety disorder   . CAD (coronary artery disease)   . Chest pain   . Colon cancer ([place])   . Depression   . Dysphonia   . Dysthymic disorder   . Gastroesophageal reflux disease   . Hyperlipidemia   . Insomnia   . MRSA (methicillin resistant Staphylococcus aureus)   . Neuropathy   . Obesity   . Pneumonia   . Pre-syncope     Past Surgical History:  Procedure Laterality Date  . ABDOMINAL HYSTERECTOMY    . BLADDER SURGERY    . CATARACT EXTRACTION, BILATERAL    . COLON SURGERY     Due to colon cancer  . COLONOSCOPY  04/09/2015   Colonic polyp status post polypectomy. Mild pancolonic diverticulosis/ History of colon cancer status post right hemicolectomy. No evidence of recurrence    . ESOPHAGOGASTRODUODENOSCOPY  05/26/2017   Schatzkis ring statust post esophageal dilatation. Hiatal hernia.   Marland Kitchen PORT-A-CATH REMOVAL    . TUBAL LIGATION      Family History  Problem Relation Age of Onset  . CAD Mother        Age of Onset 58  . Hypertension Mother   . Kidney disease Mother   . CVA Brother   . Heart failure Father   . COPD Father   . Colon cancer Neg Hx     Social History   Tobacco Use  . Smoking status: Never Smoker  . Smokeless tobacco: Never Used  Substance Use Topics  . Alcohol use: No  . Drug use: No    Current Outpatient Medications  Medication Sig Dispense Refill  . aspirin EC 81 MG tablet Take 81 mg by mouth daily.    Marland Kitchen CALCIUM PO Take 1 tablet by mouth daily.    . Cholecalciferol (VITAMIN D3) 2000 units TABS Take 2,000 Units by mouth daily.    Marland Kitchen dexlansoprazole (DEXILANT) 60 MG capsule Take 1 capsule (60 mg total) by mouth daily. 30 capsule 0  . DULoxetine (CYMBALTA) 60 MG capsule Take 60 mg by mouth daily.  1  . fluticasone (FLONASE) 50 MCG/ACT nasal spray Place 1 spray into both nostrils 2 (two) times daily as needed for allergies or rhinitis.   0  . gabapentin (NEURONTIN) 400 MG capsule Take 800-1,200 mg by mouth  See admin instructions. 800 mg in the morning then 800 mg midday then 1,200 mg at bedtime  1  . metoprolol succinate (TOPROL XL) 25 MG 24 hr tablet Take 1 tablet (25 mg total) by mouth 2 (two) times daily. 60 tablet 11  . Multiple Vitamins-Minerals (CENTRUM SILVER 50+WOMEN) TABS Take 1 tablet by mouth daily.    . nitroGLYCERIN (NITROSTAT) 0.4 MG SL tablet Place 0.4 mg under the tongue every 5 (five) minutes x 3 doses as needed for chest pain.     . rosuvastatin (CRESTOR) 20 MG tablet Take 20 mg by mouth at bedtime.    . triamcinolone cream (KENALOG) 0.1 % Apply 1 application topically See admin instructions. TO AFFECTED AREA(S) DAILY AS DIRECTED     No current facility-administered medications for this visit.     Allergies    Allergen Reactions  . Penicillins Rash    Has patient had a PCN reaction causing immediate rash, facial/tongue/throat swelling, SOB or lightheadedness with hypotension: Yes Has patient had a PCN reaction causing severe rash involving mucus membranes or skin necrosis: No Has patient had a PCN reaction that required hospitalization: No Has patient had a PCN reaction occurring within the last 10 years: No If all of the above answers are "NO", then may proceed with Cephalosporin use.   . Codeine Other (See Comments)    Dizziness and lethargy   . Guaifenesin & Derivatives Rash  . Latex Rash    Usually from adhesive tapes  . Other Rash    Tape cannot be on the skin for an extended period of time  . Pseudoephedrine Rash  . Sulfa Antibiotics Rash  . Sulfasalazine Rash  . Tape Rash    Cannot be on the skin for an extended period of time    Review of Systems:  Constitutional: Denies fever, chills, diaphoresis, appetite change and fatigue.  HEENT: Has allergies Respiratory: Denies SOB, DOE, cough, chest tightness,  and wheezing.   Cardiovascular: Denies chest pain, palpitations and leg swelling.  Genitourinary: Denies dysuria, urgency, frequency, hematuria, flank pain and difficulty urinating.  Musculoskeletal: Denies myalgias, has been having back pain, joint swelling, arthralgias and gait problem.  Awaiting back surgery. Skin: No rash.  Neurological: Denies dizziness, seizures, syncope, weakness, light-headedness, numbness and headaches.  Hematological: Denies adenopathy. Easy bruising, personal or family bleeding history  Psychiatric/Behavioral: Has anxiety or depression.  Having sleeping problems.     Physical Exam:    BP 126/74   Pulse 68   Ht 5' (1.524 m)   Wt 163 lb (73.9 kg)   BMI 31.83 kg/m  Filed Weights   05/19/18 0826  Weight: 163 lb (73.9 kg)   Constitutional:  Well-developed, in no acute distress. Psychiatric: Normal mood and affect. Behavior is normal. HEENT:  Pupils normal.  Conjunctivae are normal. No scleral icterus. Neck supple.  Cardiovascular: Normal rate, regular rhythm. No edema Pulmonary/chest: Effort normal and breath sounds normal. No wheezing, rales or rhonchi. Abdominal: Soft, nondistended. Nontender. Bowel sounds active throughout. There are no masses palpable. No hepatomegaly. Rectal:  defered Neurological: Alert and oriented to person place and time. Skin: Skin is warm and dry. No rashes noted.  Data Reviewed: I have personally reviewed following labs and imaging studies  CBC: CBC Latest Ref Rng & Units 12/20/2016 12/19/2016  WBC 4.0 - 10.5 K/uL 8.8 11.6(H)  Hemoglobin 12.0 - 15.0 g/dL 14.0 13.6  Hematocrit 36.0 - 46.0 % 43.6 42.2  Platelets 150 - 400 K/uL 145(L) 144(L)  CMP: CMP Latest Ref Rng & Units 12/20/2016 12/19/2016  Glucose 65 - 99 mg/dL 108(H) 88  BUN 6 - 20 mg/dL 8 12  Creatinine 0.44 - 1.00 mg/dL 0.89 0.91  Sodium 135 - 145 mmol/L 138 138  Potassium 3.5 - 5.1 mmol/L 4.6 4.0  Chloride 101 - 111 mmol/L 105 105  CO2 22 - 32 mmol/L 27 21(L)  Calcium 8.9 - 10.3 mg/dL 9.0 8.6(L)  Total Protein 6.5 - 8.1 g/dL - 7.1  Total Bilirubin 0.3 - 1.2 mg/dL - 1.1  Alkaline Phos 38 - 126 U/L - 83  AST 15 - 41 U/L - 27  ALT 14 - 54 U/L - 19  I spent 15 minutes of face-to-face time with the patient. Greater than 50% of the time was spent counseling and coordinating care.    Carmell Austria, MD 05/19/2018, 8:45 AM  Cc: Charlynn Court, NP

## 2018-05-19 NOTE — Patient Instructions (Signed)
If you are age 64 or older, your body mass index should be between 23-30. Your Body mass index is 31.83 kg/m. If this is out of the aforementioned range listed, please consider follow up with your Primary Care Provider.  If you are age 31 or younger, your body mass index should be between 19-25. Your Body mass index is 31.83 kg/m. If this is out of the aformentioned range listed, please consider follow up with your Primary Care Provider.   We have sent the following medications to your pharmacy for you to pick up at your convenience: Dexilant Pepcid  Thank you,  Dr. Jackquline Denmark

## 2018-05-23 ENCOUNTER — Telehealth: Payer: Self-pay | Admitting: Cardiology

## 2018-05-23 ENCOUNTER — Telehealth: Payer: Self-pay | Admitting: Gastroenterology

## 2018-05-23 NOTE — Telephone Encounter (Signed)
Wants to know if her release form has been filled out yet

## 2018-05-23 NOTE — Telephone Encounter (Signed)
Called and spoke with patient-patient explains that her back surgeon needs clearance from PCP, GI MD, and cardiology; Dr. Lyndel Safe did fill out the paperwork and CMA, Larena Glassman faxed this paperwork to the patient's back surgeon on the day of the patient's office visit; fax confirmation received per CMA; patient has the original copy and was informed of such; patient was advised of alternative places to fax her paperwork instead of having to drive from [place] to [place] to give the paperwork to her back sx; patient also advised to call if questions/concerns arise; patient verbalized understanding of information/instructions;

## 2018-05-24 NOTE — Telephone Encounter (Signed)
Informed patient she needs a follow up appointment for surgery clearance. She verbally understands. She is scheduled on 06/13/18.

## 2018-05-24 NOTE — Telephone Encounter (Signed)
Informed patient we do not have a cancellation list but she is welcome to call back anytime to see if a appointment has opened up. She verbally understands

## 2018-06-13 ENCOUNTER — Encounter: Payer: Self-pay | Admitting: Cardiology

## 2018-06-13 ENCOUNTER — Ambulatory Visit (INDEPENDENT_AMBULATORY_CARE_PROVIDER_SITE_OTHER): Payer: Medicare HMO | Admitting: Cardiology

## 2018-06-13 VITALS — BP 114/70 | HR 64 | Ht 60.0 in | Wt 161.2 lb

## 2018-06-13 DIAGNOSIS — R002 Palpitations: Secondary | ICD-10-CM | POA: Diagnosis not present

## 2018-06-13 DIAGNOSIS — R0789 Other chest pain: Secondary | ICD-10-CM | POA: Diagnosis not present

## 2018-06-13 DIAGNOSIS — R7303 Prediabetes: Secondary | ICD-10-CM

## 2018-06-13 DIAGNOSIS — E782 Mixed hyperlipidemia: Secondary | ICD-10-CM

## 2018-06-13 DIAGNOSIS — Z01810 Encounter for preprocedural cardiovascular examination: Secondary | ICD-10-CM

## 2018-06-13 NOTE — Patient Instructions (Signed)
Medication Instructions:  Your physician recommends that you continue on your current medications as directed. Please refer to the Current Medication list given to you today.  If you need a refill on your cardiac medications before your next appointment, please call your pharmacy.   Lab work: None.  If you have labs (blood work) drawn today and your tests are completely normal, you will receive your results only by: . MyChart Message (if you have MyChart) OR . A paper copy in the mail If you have any lab test that is abnormal or we need to change your treatment, we will call you to review the results.  Testing/Procedures: Your physician has requested that you have an echocardiogram. Echocardiography is a painless test that uses sound waves to create images of your heart. It provides your doctor with information about the size and shape of your heart and how well your heart's chambers and valves are working. This procedure takes approximately one hour. There are no restrictions for this procedure.    Follow-Up: At [facility], you and your health needs are our priority.  As part of our continuing mission to provide you with exceptional heart care, we have created designated Provider Care Teams.  These Care Teams include your primary Cardiologist (physician) and Advanced Practice Providers (APPs -  Physician Assistants and Nurse Practitioners) who all work together to provide you with the care you need, when you need it. You will need a follow up appointment in 6 months.  Please call our office 2 months in advance to schedule this appointment.  You may see No primary care provider on file. or another member of our [facility] Provider Team in [place]: Brian Munley, MD . Rajan Revankar, MD  Any Other Special Instructions Will Be Listed Below (If Applicable).   Echocardiogram An echocardiogram is a procedure that uses painless sound waves (ultrasound) to produce an image of the heart.  Images from an echocardiogram can provide important information about:  Signs of coronary artery disease (CAD).  Aneurysm detection. An aneurysm is a weak or damaged part of an artery wall that bulges out from the normal force of blood pumping through the body.  Heart size and shape. Changes in the size or shape of the heart can be associated with certain conditions, including heart failure, aneurysm, and CAD.  Heart muscle function.  Heart valve function.  Signs of a past heart attack.  Fluid buildup around the heart.  Thickening of the heart muscle.  A tumor or infectious growth around the heart valves. Tell a health care provider about:  Any allergies you have.  All medicines you are taking, including vitamins, herbs, eye drops, creams, and over-the-counter medicines.  Any blood disorders you have.  Any surgeries you have had.  Any medical conditions you have.  Whether you are pregnant or may be pregnant. What are the risks? Generally, this is a safe procedure. However, problems may occur, including:  Allergic reaction to dye (contrast) that may be used during the procedure. What happens before the procedure? No specific preparation is needed. You may eat and drink normally. What happens during the procedure?   An IV tube may be inserted into one of your veins.  You may receive contrast through this tube. A contrast is an injection that improves the quality of the pictures from your heart.  A gel will be applied to your chest.  A wand-like tool (transducer) will be moved over your chest. The gel will help to   transmit the sound waves from the transducer.  The sound waves will harmlessly bounce off of your heart to allow the heart images to be captured in real-time motion. The images will be recorded on a computer. The procedure may vary among health care providers and hospitals. What happens after the procedure?  You may return to your normal, everyday life,  including diet, activities, and medicines, unless your health care provider tells you not to do that. Summary  An echocardiogram is a procedure that uses painless sound waves (ultrasound) to produce an image of the heart.  Images from an echocardiogram can provide important information about the size and shape of your heart, heart muscle function, heart valve function, and fluid buildup around your heart.  You do not need to do anything to prepare before this procedure. You may eat and drink normally.  After the echocardiogram is completed, you may return to your normal, everyday life, unless your health care provider tells you not to do that. This information is not intended to replace advice given to you by your health care provider. Make sure you discuss any questions you have with your health care provider. Document Released: 05/28/2000 Document Revised: 07/03/2016 Document Reviewed: 07/03/2016 Elsevier Interactive Patient Education  2019 Elsevier Inc.    

## 2018-06-13 NOTE — Addendum Note (Signed)
Addended by: Ashok Norris on: 06/13/2018 09:40 AM   Modules accepted: Orders

## 2018-06-13 NOTE — Progress Notes (Signed)
Cardiology Office Note:    Date:  06/13/2018   ID:  [name], [name] 07-27-1955, MRN [id number]  PCP:  Charlynn Court, NP  Cardiologist:  Jenne Campus, MD    Referring MD: Charlynn Court, NP   Chief Complaint  Patient presents with  . Follow-up  I need back surgery  History of Present Illness:    [name] is a 62 y.o. female with risk factors for coronary artery disease cardiac catheterization was done in 2018 in the summertime which showed normal coronaries she is scheduled to have back surgery which will be done under general anesthesia she is here to be evaluated before the surgery denies have any chest pain tightness squeezing pressure pain chest she can go from front to the back of [employer] with some pain in the back but no pain in the chest she can climb 1 flight of stairs with no difficulties except for back pain.  Overall cardiac wise seems to be doing fine but described to have some shortness of breath it is difficult to assess at this scenario.  It could be simply because of the condition because of chronic back pain or some cardiac issues therefore I think it would be reasonable to perform echocardiogram  Past Medical History:  Diagnosis Date  . Adjustment disorder   . Anxiety disorder   . CAD (coronary artery disease)   . Chest pain   . Colon cancer ([place])   . Depression   . Dysphonia   . Dysthymic disorder   . Gastroesophageal reflux disease   . Hyperlipidemia   . Insomnia   . MRSA (methicillin resistant Staphylococcus aureus)   . Neuropathy   . Obesity   . Pneumonia   . Pre-syncope     Past Surgical History:  Procedure Laterality Date  . ABDOMINAL HYSTERECTOMY    . BLADDER SURGERY    . CATARACT EXTRACTION, BILATERAL    . COLON SURGERY     Due to colon cancer  . COLONOSCOPY  04/09/2015   Colonic polyp status post polypectomy. Mild pancolonic diverticulosis/ History of colon cancer status post right hemicolectomy. No evidence of recurrence   .  ESOPHAGOGASTRODUODENOSCOPY  05/26/2017   Schatzkis ring statust post esophageal dilatation. Hiatal hernia.   Marland Kitchen PORT-A-CATH REMOVAL    . TUBAL LIGATION      Current Medications: Current Meds  Medication Sig  . aspirin EC 81 MG tablet Take 81 mg by mouth daily.  Marland Kitchen CALCIUM PO Take 1 tablet by mouth daily.  . Cholecalciferol (VITAMIN D3) 2000 units TABS Take 2,000 Units by mouth daily.  Marland Kitchen dexlansoprazole (DEXILANT) 60 MG capsule Take 1 capsule (60 mg total) by mouth daily.  . DULoxetine (CYMBALTA) 60 MG capsule Take 60 mg by mouth daily.  . famotidine (PEPCID) 20 MG tablet Take 1 tablet (20 mg total) by mouth at bedtime.  . fluticasone (FLONASE) 50 MCG/ACT nasal spray Place 1 spray into both nostrils 2 (two) times daily as needed for allergies or rhinitis.   Marland Kitchen gabapentin (NEURONTIN) 400 MG capsule Take 800-1,200 mg by mouth See admin instructions. 800 mg in the morning then 800 mg midday then 1,200 mg at bedtime  . metoprolol succinate (TOPROL XL) 25 MG 24 hr tablet Take 1 tablet (25 mg total) by mouth 2 (two) times daily.  . Multiple Vitamins-Minerals (CENTRUM SILVER 50+WOMEN) TABS Take 1 tablet by mouth daily.  . nitroGLYCERIN (NITROSTAT) 0.4 MG SL tablet Place 0.4 mg under the tongue every 5 (five) minutes  x 3 doses as needed for chest pain.   . rosuvastatin (CRESTOR) 20 MG tablet Take 20 mg by mouth at bedtime.  . triamcinolone cream (KENALOG) 0.1 % Apply 1 application topically See admin instructions. TO AFFECTED AREA(S) DAILY AS DIRECTED     Allergies:   Penicillins; Codeine; Guaifenesin & derivatives; Latex; Other; Pseudoephedrine; Sulfa antibiotics; Sulfasalazine; and Tape   Social History   Socioeconomic History  . Marital status: Widowed    Spouse name: Not on file  . Number of children: Not on file  . Years of education: Not on file  . Highest education level: Not on file  Occupational History  . Not on file  Social Needs  . Financial resource strain: Not on file  . Food  insecurity:    Worry: Not on file    Inability: Not on file  . Transportation needs:    Medical: Not on file    Non-medical: Not on file  Tobacco Use  . Smoking status: Never Smoker  . Smokeless tobacco: Never Used  Substance and Sexual Activity  . Alcohol use: No  . Drug use: No  . Sexual activity: Not on file  Lifestyle  . Physical activity:    Days per week: Not on file    Minutes per session: Not on file  . Stress: Not on file  Relationships  . Social connections:    Talks on phone: Not on file    Gets together: Not on file    Attends religious service: Not on file    Active member of club or organization: Not on file    Attends meetings of clubs or organizations: Not on file    Relationship status: Not on file  Other Topics Concern  . Not on file  Social History Narrative  . Not on file     Family History: The patient's family history includes CAD in her mother; COPD in her father; CVA in her brother; Heart failure in her father; Hypertension in her mother; Kidney disease in her mother. There is no history of Colon cancer. ROS:   Please see the history of present illness.    All 14 point review of systems negative except as described per history of present illness  EKGs/Labs/Other Studies Reviewed:      Recent Labs: No results found for requested labs within last 8760 hours.  Recent Lipid Panel No results found for: CHOL, TRIG, HDL, CHOLHDL, VLDL, LDLCALC, LDLDIRECT  Physical Exam:    VS:  BP 114/70   Pulse 64   Ht 5' (1.524 m)   Wt 161 lb 3.2 oz (73.1 kg)   SpO2 95%   BMI 31.48 kg/m     Wt Readings from Last 3 Encounters:  06/13/18 161 lb 3.2 oz (73.1 kg)  05/19/18 163 lb (73.9 kg)  01/09/18 157 lb (71.2 kg)     GEN:  Well nourished, well developed in no acute distress HEENT: Normal NECK: No JVD; No carotid bruits LYMPHATICS: No lymphadenopathy CARDIAC: RRR, no murmurs, no rubs, no gallops RESPIRATORY:  Clear to auscultation without rales,  wheezing or rhonchi  ABDOMEN: Soft, non-tender, non-distended MUSCULOSKELETAL:  No edema; No deformity  SKIN: Warm and dry LOWER EXTREMITIES: no swelling NEUROLOGIC:  Alert and oriented x 3 PSYCHIATRIC:  Normal affect   ASSESSMENT:    1. Atypical chest pain   2. Borderline diabetes   3. Mixed hyperlipidemia   4. Palpitations   5. Preop cardiovascular exam    PLAN:  In order of problems listed above:  1. Atypical chest pain denies having any 2. Borderline diabetes hopefully after back surgery should be able to do more which help her to lose weight and prevent development of diabetes 3. Mixed dyslipidemia call primary care physician to get her fasting lipid profile she told me and that it was fine 4. Palpitations denies having any 5. Preop cardiovascular evaluation.  Considering the fact that she does not have any new symptoms in terms of chest pain, cardiac catheterization which was normal last year, as well as the fact that she can walk reasonably well I think we can proceed with surgery without any additional cardiovascular testing except for echocardiogram.   Medication Adjustments/Labs and Tests Ordered: Current medicines are reviewed at length with the patient today.  Concerns regarding medicines are outlined above.  No orders of the defined types were placed in this encounter.  Medication changes: No orders of the defined types were placed in this encounter.   Signed, Park Liter, MD, [facility] 06/13/2018 9:34 AM    [place]

## 2018-06-16 ENCOUNTER — Ambulatory Visit (INDEPENDENT_AMBULATORY_CARE_PROVIDER_SITE_OTHER): Payer: Medicare HMO

## 2018-06-16 DIAGNOSIS — R0789 Other chest pain: Secondary | ICD-10-CM

## 2018-06-16 NOTE — Progress Notes (Signed)
Complete echocardiogram has been performed.  Jimmy Jaki Hammerschmidt RDCS, RVT 

## 2018-06-22 ENCOUNTER — Telehealth: Payer: Self-pay | Admitting: Cardiology

## 2018-06-22 NOTE — Telephone Encounter (Signed)
Left message to inform patient she has been cleared and clearance has been faxed. Advised her to call back with any questions

## 2018-06-22 NOTE — Telephone Encounter (Signed)
Patient called and wants to know if we sent Pre-op clearance letter back to [facility], we had a form to fill out and fax back. Pleae call patient.

## 2018-06-28 ENCOUNTER — Telehealth: Payer: Self-pay | Admitting: Gastroenterology

## 2018-06-28 ENCOUNTER — Telehealth: Payer: Self-pay | Admitting: Emergency Medicine

## 2018-06-28 NOTE — Telephone Encounter (Signed)
Patient calling in asking about surgery clearance. Patient was already cleared and this was faxed on 06/22/2018. Patient reports it wasn't received. Estill Bamberg, CMA will resend Friday when she returns to [facility] Patient verbally understands

## 2018-06-28 NOTE — Telephone Encounter (Signed)
Pt called and stated that the dr was to fax over a clearance [facility] but they advised her they have not gotten it. She gave me two fax numbers which is [phone] or [phone]

## 2018-06-29 NOTE — Telephone Encounter (Signed)
Called patient and left message for her to return call

## 2018-06-29 NOTE — Telephone Encounter (Signed)
Can you please assist this patient with the appropriate paperwork for clearance for surgery-please review the previous messages concerning this issue-I think the patient has the original paperwork with her that Dr. Lyndel Safe filled out;

## 2018-07-03 NOTE — Telephone Encounter (Signed)
Patient has the original form and is going to take it to the doctor.

## 2018-07-03 NOTE — Telephone Encounter (Signed)
Called patient and left message for her to return call

## 2018-08-21 DIAGNOSIS — M4326 Fusion of spine, lumbar region: Secondary | ICD-10-CM | POA: Insufficient documentation

## 2018-08-21 HISTORY — DX: Fusion of spine, lumbar region: M43.26

## 2018-09-25 ENCOUNTER — Other Ambulatory Visit: Payer: Self-pay

## 2018-09-25 MED ORDER — DEXLANSOPRAZOLE 60 MG PO CPDR: 60.0000 mg | DELAYED_RELEASE_CAPSULE | Freq: Every day | ORAL | 3 refills | Status: DC

## 2018-12-08 ENCOUNTER — Ambulatory Visit (INDEPENDENT_AMBULATORY_CARE_PROVIDER_SITE_OTHER): Payer: Medicare HMO | Admitting: Cardiology

## 2018-12-08 ENCOUNTER — Encounter: Payer: Self-pay | Admitting: Cardiology

## 2018-12-08 ENCOUNTER — Other Ambulatory Visit: Payer: Self-pay

## 2018-12-08 VITALS — BP 116/62 | HR 68 | Ht 60.0 in | Wt 166.0 lb

## 2018-12-08 DIAGNOSIS — R002 Palpitations: Secondary | ICD-10-CM

## 2018-12-08 DIAGNOSIS — E782 Mixed hyperlipidemia: Secondary | ICD-10-CM | POA: Diagnosis not present

## 2018-12-08 DIAGNOSIS — R0789 Other chest pain: Secondary | ICD-10-CM

## 2018-12-08 NOTE — Progress Notes (Signed)
Cardiology Office Note:    Date:  12/08/2018   ID:  [name], [name] 12-23-1955, MRN [id number]  PCP:  Charlynn Court, NP  Cardiologist:  Jenne Campus, MD    Referring MD: Charlynn Court, NP   Chief Complaint  Patient presents with  . Follow-up  I am doing well cardiac wise  History of Present Illness:    [name] is a 63 y.o. female I seen her in December before elective back surgery.  In 2018 she had cardiac catheterization which showed normal coronaries.  She does have history of hypertension dyslipidemia as well as atypical chest pain.  She went to surgery without difficulties.  Surgery was complicated she required to go back to the operating room to change some hardware in her back she did well still struggling still having a lot of pain but overall recovering.  Denies have any chest pain tightness squeezing pressure burning chest  Past Medical History:  Diagnosis Date  . Adjustment disorder   . Anxiety disorder   . CAD (coronary artery disease)   . Chest pain   . Colon cancer ([place])   . Depression   . Dysphonia   . Dysthymic disorder   . Gastroesophageal reflux disease   . Hyperlipidemia   . Insomnia   . MRSA (methicillin resistant Staphylococcus aureus)   . Neuropathy   . Obesity   . Pneumonia   . Pre-syncope     Past Surgical History:  Procedure Laterality Date  . ABDOMINAL HYSTERECTOMY    . BACK SURGERY    . BLADDER SURGERY    . CATARACT EXTRACTION, BILATERAL    . COLON SURGERY     Due to colon cancer  . COLONOSCOPY  04/09/2015   Colonic polyp status post polypectomy. Mild pancolonic diverticulosis/ History of colon cancer status post right hemicolectomy. No evidence of recurrence   . ESOPHAGOGASTRODUODENOSCOPY  05/26/2017   Schatzkis ring statust post esophageal dilatation. Hiatal hernia.   Marland Kitchen PORT-A-CATH REMOVAL    . TUBAL LIGATION      Current Medications: Current Meds  Medication Sig  . aspirin EC 81 MG tablet Take 81 mg by mouth daily.   Marland Kitchen CALCIUM PO Take 1 tablet by mouth daily.  . Cholecalciferol (VITAMIN D3) 2000 units TABS Take 2,000 Units by mouth daily.  Marland Kitchen dexlansoprazole (DEXILANT) 60 MG capsule Take 1 capsule (60 mg total) by mouth daily.  . DULoxetine (CYMBALTA) 60 MG capsule Take 60 mg by mouth daily.  . famotidine (PEPCID) 20 MG tablet Take 1 tablet (20 mg total) by mouth at bedtime.  . fluticasone (FLONASE) 50 MCG/ACT nasal spray Place 1 spray into both nostrils 2 (two) times daily as needed for allergies or rhinitis.   Marland Kitchen gabapentin (NEURONTIN) 400 MG capsule Take 800-1,200 mg by mouth See admin instructions. 800 mg in the morning then 800 mg midday then 1,200 mg at bedtime  . metoprolol succinate (TOPROL XL) 25 MG 24 hr tablet Take 1 tablet (25 mg total) by mouth 2 (two) times daily.  . Multiple Vitamins-Minerals (CENTRUM SILVER 50+WOMEN) TABS Take 1 tablet by mouth daily.  . nitroGLYCERIN (NITROSTAT) 0.4 MG SL tablet Place 0.4 mg under the tongue every 5 (five) minutes x 3 doses as needed for chest pain.   . rosuvastatin (CRESTOR) 20 MG tablet Take 20 mg by mouth at bedtime.  . triamcinolone cream (KENALOG) 0.1 % Apply 1 application topically See admin instructions. TO AFFECTED AREA(S) DAILY AS DIRECTED     Allergies:  Penicillins, Codeine, Guaifenesin & derivatives, Latex, Other, Pseudoephedrine, Sulfa antibiotics, Sulfasalazine, and Tape   Social History   Socioeconomic History  . Marital status: Widowed    Spouse name: Not on file  . Number of children: Not on file  . Years of education: Not on file  . Highest education level: Not on file  Occupational History  . Not on file  Social Needs  . Financial resource strain: Not on file  . Food insecurity    Worry: Not on file    Inability: Not on file  . Transportation needs    Medical: Not on file    Non-medical: Not on file  Tobacco Use  . Smoking status: Never Smoker  . Smokeless tobacco: Never Used  Substance and Sexual Activity  . Alcohol use:  No  . Drug use: No  . Sexual activity: Not on file  Lifestyle  . Physical activity    Days per week: Not on file    Minutes per session: Not on file  . Stress: Not on file  Relationships  . Social Herbalist on phone: Not on file    Gets together: Not on file    Attends religious service: Not on file    Active member of club or organization: Not on file    Attends meetings of clubs or organizations: Not on file    Relationship status: Not on file  Other Topics Concern  . Not on file  Social History Narrative  . Not on file     Family History: The patient's family history includes CAD in her mother; COPD in her father; CVA in her brother; Heart failure in her father; Hypertension in her mother; Kidney disease in her mother. There is no history of Colon cancer. ROS:   Please see the history of present illness.    All 14 point review of systems negative except as described per history of present illness  EKGs/Labs/Other Studies Reviewed:      Recent Labs: No results found for requested labs within last 8760 hours.  Recent Lipid Panel No results found for: CHOL, TRIG, HDL, CHOLHDL, VLDL, LDLCALC, LDLDIRECT  Physical Exam:    VS:  BP 116/62   Pulse 68   Ht 5' (1.524 m)   Wt 166 lb (75.3 kg)   SpO2 98%   BMI 32.42 kg/m     Wt Readings from Last 3 Encounters:  12/08/18 166 lb (75.3 kg)  06/13/18 161 lb 3.2 oz (73.1 kg)  05/19/18 163 lb (73.9 kg)     GEN:  Well nourished, well developed in no acute distress HEENT: Normal NECK: No JVD; No carotid bruits LYMPHATICS: No lymphadenopathy CARDIAC: RRR, no murmurs, no rubs, no gallops RESPIRATORY:  Clear to auscultation without rales, wheezing or rhonchi  ABDOMEN: Soft, non-tender, non-distended MUSCULOSKELETAL:  No edema; No deformity  SKIN: Warm and dry LOWER EXTREMITIES: no swelling NEUROLOGIC:  Alert and oriented x 3 PSYCHIATRIC:  Normal affect   ASSESSMENT:    1. Atypical chest pain   2. Mixed  hyperlipidemia   3. Palpitations    PLAN:    In order of problems listed above:  1. Atypical chest pain denies having any doing well stable from my point of view 2. Dyslipidemia he is on Crestor 20 which I will continue.  Will call primary care physician to get fasting lipid profile 3. Palpitations denies having any  All overall she is doing well I see her back in 6  months   Medication Adjustments/Labs and Tests Ordered: Current medicines are reviewed at length with the patient today.  Concerns regarding medicines are outlined above.  No orders of the defined types were placed in this encounter.  Medication changes: No orders of the defined types were placed in this encounter.   Signed, Park Liter, MD, [facility] 12/08/2018 2:57 PM    [facility]

## 2018-12-08 NOTE — Patient Instructions (Signed)
Medication Instructions:  Your physician recommends that you continue on your current medications as directed. Please refer to the Current Medication list given to you today.  If you need a refill on your cardiac medications before your next appointment, please call your pharmacy.   Lab work: None.   If you have labs (blood work) drawn today and your tests are completely normal, you will receive your results only by: . MyChart Message (if you have MyChart) OR . A paper copy in the mail If you have any lab test that is abnormal or we need to change your treatment, we will call you to review the results.  Testing/Procedures: None.   Follow-Up: At [facility], you and your health needs are our priority.  As part of our continuing mission to provide you with exceptional heart care, we have created designated Provider Care Teams.  These Care Teams include your primary Cardiologist (physician) and Advanced Practice Providers (APPs -  Physician Assistants and Nurse Practitioners) who all work together to provide you with the care you need, when you need it. You will need a follow up appointment in 6 months.  Please call our office 2 months in advance to schedule this appointment.  You may see No primary care provider on file. or another member of our [facility] Provider Team in [place]: Brian Munley, MD . Rajan Revankar, MD  Any Other Special Instructions Will Be Listed Below (If Applicable).    

## 2019-03-05 DIAGNOSIS — R978 Other abnormal tumor markers: Secondary | ICD-10-CM | POA: Insufficient documentation

## 2019-04-16 ENCOUNTER — Ambulatory Visit: Payer: Medicare HMO | Admitting: Cardiology

## 2019-05-29 ENCOUNTER — Other Ambulatory Visit: Payer: Self-pay

## 2019-05-29 ENCOUNTER — Encounter: Payer: Self-pay | Admitting: Cardiology

## 2019-05-29 ENCOUNTER — Ambulatory Visit (INDEPENDENT_AMBULATORY_CARE_PROVIDER_SITE_OTHER): Payer: Medicare HMO | Admitting: Cardiology

## 2019-05-29 VITALS — BP 116/72 | HR 56 | Ht 60.0 in | Wt 167.0 lb

## 2019-05-29 DIAGNOSIS — R55 Syncope and collapse: Secondary | ICD-10-CM

## 2019-05-29 DIAGNOSIS — A4902 Methicillin resistant Staphylococcus aureus infection, unspecified site: Secondary | ICD-10-CM | POA: Diagnosis not present

## 2019-05-29 DIAGNOSIS — R002 Palpitations: Secondary | ICD-10-CM

## 2019-05-29 DIAGNOSIS — R0789 Other chest pain: Secondary | ICD-10-CM | POA: Diagnosis not present

## 2019-05-29 NOTE — Addendum Note (Signed)
Addended by: Ashok Norris on: 05/29/2019 09:28 AM   Modules accepted: Orders

## 2019-05-29 NOTE — Patient Instructions (Signed)
Medication Instructions:  Your physician recommends that you continue on your current medications as directed. Please refer to the Current Medication list given to you today.  *If you need a refill on your cardiac medications before your next appointment, please call your pharmacy*  Lab Work: None.  If you have labs (blood work) drawn today and your tests are completely normal, you will receive your results only by: Marland Kitchen MyChart Message (if you have MyChart) OR . A paper copy in the mail If you have any lab test that is abnormal or we need to change your treatment, we will call you to review the results.  Testing/Procedures: Your physician has requested that you have an echocardiogram. Echocardiography is a painless test that uses sound waves to create images of your heart. It provides your doctor with information about the size and shape of your heart and how well your heart's chambers and valves are working. This procedure takes approximately one hour. There are no restrictions for this procedure.    Follow-Up: At [facility], you and your health needs are our priority.  As part of our continuing mission to provide you with exceptional heart care, we have created designated Provider Care Teams.  These Care Teams include your primary Cardiologist (physician) and Advanced Practice Providers (APPs -  Physician Assistants and Nurse Practitioners) who all work together to provide you with the care you need, when you need it.  Your next appointment:   1 year(s)  The format for your next appointment:   In Person  Provider:   Jenne Campus, MD  Other Instructions   Echocardiogram An echocardiogram is a procedure that uses painless sound waves (ultrasound) to produce an image of the heart. Images from an echocardiogram can provide important information about:  Signs of coronary artery disease (CAD).  Aneurysm detection. An aneurysm is a weak or damaged part of an artery wall that  bulges out from the normal force of blood pumping through the body.  Heart size and shape. Changes in the size or shape of the heart can be associated with certain conditions, including heart failure, aneurysm, and CAD.  Heart muscle function.  Heart valve function.  Signs of a past heart attack.  Fluid buildup around the heart.  Thickening of the heart muscle.  A tumor or infectious growth around the heart valves. Tell a health care provider about:  Any allergies you have.  All medicines you are taking, including vitamins, herbs, eye drops, creams, and over-the-counter medicines.  Any blood disorders you have.  Any surgeries you have had.  Any medical conditions you have.  Whether you are pregnant or may be pregnant. What are the risks? Generally, this is a safe procedure. However, problems may occur, including:  Allergic reaction to dye (contrast) that may be used during the procedure. What happens before the procedure? No specific preparation is needed. You may eat and drink normally. What happens during the procedure?   An IV tube may be inserted into one of your veins.  You may receive contrast through this tube. A contrast is an injection that improves the quality of the pictures from your heart.  A gel will be applied to your chest.  A wand-like tool (transducer) will be moved over your chest. The gel will help to transmit the sound waves from the transducer.  The sound waves will harmlessly bounce off of your heart to allow the heart images to be captured in real-time motion. The images will be recorded  on a computer. The procedure may vary among health care providers and hospitals. What happens after the procedure?  You may return to your normal, everyday life, including diet, activities, and medicines, unless your health care provider tells you not to do that. Summary  An echocardiogram is a procedure that uses painless sound waves (ultrasound) to produce  an image of the heart.  Images from an echocardiogram can provide important information about the size and shape of your heart, heart muscle function, heart valve function, and fluid buildup around your heart.  You do not need to do anything to prepare before this procedure. You may eat and drink normally.  After the echocardiogram is completed, you may return to your normal, everyday life, unless your health care provider tells you not to do that. This information is not intended to replace advice given to you by your health care provider. Make sure you discuss any questions you have with your health care provider. Document Released: 05/28/2000 Document Revised: 09/21/2018 Document Reviewed: 07/03/2016 Elsevier Patient Education  2020 Elsevier Inc.   

## 2019-05-29 NOTE — Progress Notes (Signed)
Cardiology Office Note:    Date:  05/29/2019   ID:  [name] 05-Oct-1955, MRN [id number]  PCP:  Megan Court, NP  Cardiologist:  Jenne Campus, MD    Referring MD: Megan Court, NP   Chief Complaint  Patient presents with  . Follow-up  Doing well cardiac wise  History of Present Illness:    [name] is a 63 y.o. female with past medical history significant for chronic back problem that required surgery.  She still suffering with that.  She did have cardiac catheterization 2018 because of atypical chest pain which showed normal coronaries.  Also history of hypertension as well as dyslipidemia.  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  She comes today to my office for follow-up.  Overall doing well.  Denies have any chest pain, tightness, pressure, burning in the chest.  The biggest problem is chronic back issues.  Past Medical History:  Diagnosis Date  . Adjustment disorder   . Anxiety disorder   . CAD (coronary artery disease)   . Chest pain   . Colon cancer ([place])   . Depression   . Dysphonia   . Dysthymic disorder   . Gastroesophageal reflux disease   . Hyperlipidemia   . Insomnia   . MRSA (methicillin resistant Staphylococcus aureus)   . Neuropathy   . Obesity   . Pneumonia   . Pre-syncope     Past Surgical History:  Procedure Laterality Date  . ABDOMINAL HYSTERECTOMY    . BACK SURGERY    . BLADDER SURGERY    . CATARACT EXTRACTION, BILATERAL    . COLON SURGERY     Due to colon cancer  . COLONOSCOPY  04/09/2015   Colonic polyp status post polypectomy. Mild pancolonic diverticulosis/ History of colon cancer status post right hemicolectomy. No evidence of recurrence   . ESOPHAGOGASTRODUODENOSCOPY  05/26/2017   Schatzkis ring statust post esophageal dilatation. Hiatal hernia.   Marland Kitchen PORT-A-CATH REMOVAL    . TUBAL LIGATION      Current Medications: Current Meds  Medication Sig  . aspirin  EC 81 MG tablet Take 81 mg by mouth daily.  Marland Kitchen CALCIUM PO Take 1 tablet by mouth daily.  . Cholecalciferol (VITAMIN D3) 2000 units TABS Take 2,000 Units by mouth daily.  Marland Kitchen dexlansoprazole (DEXILANT) 60 MG capsule Take 1 capsule (60 mg total) by mouth daily.  . DULoxetine (CYMBALTA) 60 MG capsule Take 60 mg by mouth daily.  . famotidine (PEPCID) 20 MG tablet Take 1 tablet (20 mg total) by mouth at bedtime.  . fluticasone (FLONASE) 50 MCG/ACT nasal spray Place 1 spray into both nostrils 2 (two) times daily as needed for allergies or rhinitis.   Marland Kitchen gabapentin (NEURONTIN) 800 MG tablet Take 800 mg by mouth 3 (three) times daily.   . metoprolol succinate (TOPROL XL) 25 MG 24 hr tablet Take 1 tablet (25 mg total) by mouth 2 (two) times daily.  . Multiple Vitamins-Minerals (CENTRUM SILVER 50+WOMEN) TABS Take 1 tablet by mouth daily.  . nitroGLYCERIN (NITROSTAT) 0.4 MG SL tablet Place 0.4 mg under the tongue every 5 (five) minutes x 3 doses as needed for chest pain.   . rosuvastatin (CRESTOR) 20 MG tablet Take 20 mg by mouth at bedtime.  . triamcinolone cream (KENALOG) 0.1 % Apply 1 application topically See admin instructions. TO AFFECTED AREA(S) DAILY AS DIRECTED     Allergies:   Penicillins, Codeine, Guaifenesin & derivatives,  Latex, Other, Pseudoephedrine, Sulfa antibiotics, Sulfasalazine, and Tape   Social History   Socioeconomic History  . Marital status: Widowed    Spouse name: Not on file  . Number of children: Not on file  . Years of education: Not on file  . Highest education level: Not on file  Occupational History  . Not on file  Tobacco Use  . Smoking status: Never Smoker  . Smokeless tobacco: Never Used  Substance and Sexual Activity  . Alcohol use: No  . Drug use: No  . Sexual activity: Not on file  Other Topics Concern  . Not on file  Social History Narrative  . Not on file   Social Determinants of Health   Financial Resource Strain:   . Difficulty of Paying Living  Expenses: Not on file  Food Insecurity:   . Worried About Charity fundraiser in the Last Year: Not on file  . Ran Out of Food in the Last Year: Not on file  Transportation Needs:   . Lack of Transportation (Medical): Not on file  . Lack of Transportation (Non-Medical): Not on file  Physical Activity:   . Days of Exercise per Week: Not on file  . Minutes of Exercise per Session: Not on file  Stress:   . Feeling of Stress : Not on file  Social Connections:   . Frequency of Communication with Friends and Family: Not on file  . Frequency of Social Gatherings with Friends and Family: Not on file  . Attends Religious Services: Not on file  . Active Member of Clubs or Organizations: Not on file  . Attends Archivist Meetings: Not on file  . Marital Status: Not on file     Family History: The patient's family history includes CAD in her mother; COPD in her father; CVA in her brother; Heart failure in her father; Hypertension in her mother; Kidney disease in her mother. There is no history of Colon cancer. ROS:   Please see the history of present illness.    All 14 point review of systems negative except as described per history of present illness  EKGs/Labs/Other Studies Reviewed:      Recent Labs: No results found for requested labs within last 8760 hours.  Recent Lipid Panel No results found for: CHOL, TRIG, HDL, CHOLHDL, VLDL, LDLCALC, LDLDIRECT  Physical Exam:    VS:  BP 116/72   Pulse (!) 56   Ht 5' (1.524 m)   Wt 167 lb (75.8 kg)   SpO2 98%   BMI 32.61 kg/m     Wt Readings from Last 3 Encounters:  05/29/19 167 lb (75.8 kg)  12/08/18 166 lb (75.3 kg)  06/13/18 161 lb 3.2 oz (73.1 kg)     GEN:  Well nourished, well developed in no acute distress HEENT: Normal NECK: No JVD; No carotid bruits LYMPHATICS: No lymphadenopathy CARDIAC: RRR, no murmurs, no rubs, no gallops RESPIRATORY:  Clear to auscultation without rales, wheezing or rhonchi  ABDOMEN: Soft,  non-tender, non-distended MUSCULOSKELETAL:  No edema; No deformity  SKIN: Warm and dry LOWER EXTREMITIES: no swelling NEUROLOGIC:  Alert and oriented x 3 PSYCHIATRIC:  Normal affect   ASSESSMENT:    1. Pre-syncope   2. Palpitations   3. MRSA infection   4. Atypical chest pain    PLAN:    In order of problems listed above:  1. Presyncope denies having any 2. Palpitations doing well from that point review does not have any 3. Atypical  chest pain denies having any.  Cardiac catheterization 2018 normal. 4. Essential hypertension blood pressure well controlled continue present management. 5. 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  We will repeat echocardiogram.   Medication Adjustments/Labs and Tests Ordered: Current medicines are reviewed at length with the patient today.  Concerns regarding medicines are outlined above.  No orders of the defined types were placed in this encounter.  Medication changes: No orders of the defined types were placed in this encounter.   Signed, Park Liter, MD, [facility] 05/29/2019 9:20 AM    [employer]

## 2019-06-06 ENCOUNTER — Telehealth: Payer: Self-pay | Admitting: Gastroenterology

## 2019-06-06 MED ORDER — FAMOTIDINE 20 MG PO TABS: 20.0000 mg | ORAL_TABLET | Freq: Every day | ORAL | 0 refills | Status: DC

## 2019-06-06 NOTE — Telephone Encounter (Signed)
Patient called requesting refill on Famotidine 20mg  to [facility] on [address] in [place]

## 2019-06-06 NOTE — Telephone Encounter (Signed)
I have sent refill to patients pharmacy.  

## 2019-07-17 ENCOUNTER — Other Ambulatory Visit: Payer: Self-pay

## 2019-07-17 MED ORDER — FAMOTIDINE 20 MG PO TABS: 20.0000 mg | ORAL_TABLET | Freq: Every day | ORAL | 0 refills | Status: DC

## 2019-07-25 ENCOUNTER — Ambulatory Visit (INDEPENDENT_AMBULATORY_CARE_PROVIDER_SITE_OTHER): Payer: Medicare HMO

## 2019-07-25 ENCOUNTER — Other Ambulatory Visit: Payer: Self-pay

## 2019-07-25 DIAGNOSIS — R002 Palpitations: Secondary | ICD-10-CM | POA: Diagnosis not present

## 2019-07-25 DIAGNOSIS — R0789 Other chest pain: Secondary | ICD-10-CM | POA: Diagnosis not present

## 2019-07-25 NOTE — Progress Notes (Signed)
Complete echocardiogram has been performed.  Jimmy Luane Rochon RDCS, RVT 

## 2019-07-30 ENCOUNTER — Telehealth: Payer: Self-pay | Admitting: Cardiology

## 2019-07-30 NOTE — Telephone Encounter (Signed)
Left message for patient to call back  

## 2019-07-30 NOTE — Telephone Encounter (Signed)
Spoke with pt, aware of echocardiogram results.

## 2019-07-30 NOTE — Telephone Encounter (Signed)
New message  Patient is returning a call about her results. Please give patient a call back.

## 2019-07-30 NOTE — Telephone Encounter (Signed)
Spoke with patient. Results of Echo reviewed. Per Dr. Agustin Cree: Echocardiogram showed normal left ventricle ejection fraction, mild mitral valve regurgitation, mild dilatation of the ascending aorta 38 mm, medical therapy. Patient verbalized understanding.

## 2019-07-30 NOTE — Telephone Encounter (Signed)
Follow Up:  Pt would her Echo results from last week please

## 2019-07-31 ENCOUNTER — Encounter: Payer: Self-pay | Admitting: Gastroenterology

## 2019-07-31 ENCOUNTER — Telehealth (INDEPENDENT_AMBULATORY_CARE_PROVIDER_SITE_OTHER): Payer: Medicare HMO | Admitting: Gastroenterology

## 2019-07-31 ENCOUNTER — Other Ambulatory Visit: Payer: Self-pay

## 2019-07-31 VITALS — Ht 60.0 in | Wt 165.0 lb

## 2019-07-31 DIAGNOSIS — Z85038 Personal history of other malignant neoplasm of large intestine: Secondary | ICD-10-CM | POA: Diagnosis not present

## 2019-07-31 DIAGNOSIS — K2100 Gastro-esophageal reflux disease with esophagitis, without bleeding: Secondary | ICD-10-CM | POA: Diagnosis not present

## 2019-07-31 MED ORDER — DEXILANT 60 MG PO CPDR: 60.0000 mg | DELAYED_RELEASE_CAPSULE | Freq: Every day | ORAL | 4 refills | Status: DC

## 2019-07-31 MED ORDER — FAMOTIDINE 20 MG PO TABS: 20.0000 mg | ORAL_TABLET | Freq: Every day | ORAL | 4 refills | Status: DC

## 2019-07-31 NOTE — Progress Notes (Signed)
Chief Complaint: FU  Referring Provider:  Charlynn Court, NP      ASSESSMENT AND PLAN;   #1. GERD with large [place]   #2.  H/O colon cancer (stage III, [id number]) s/p R hemicolectomy 2012.  H/O colonic polyps 03/2015.  Next colon due 03/2020.  Being followed by oncology.  Plan: - Dexilant 60mg  po qd #90, 4 refiils. - Pepcid 20mg  po qhs. #90, 4 refills. - Follow-up in Sept 2021.  Earlier, if with any problems. At FU, repeat colon. HPI:    [name] is a 64 y.o. female  For follow-up No complaints For medication refill Doing well Has appointment with oncology in mid April.  She has gotten both vaccines of COVID-19.  Had some headaches with the second dose.   No nausea, vomiting, regurgitation, odynophagia or dysphagia.  No significant diarrhea or constipation.  There is no melena or hematochezia. No unintentional weight loss.   Past GI procedures: -EGD 05/26/2017 8 cm HH with small paraesophageal component, Schatzki ring s/p dil 52 Fr -Colonoscopy 04/09/2015 ([facility])-6 mm sessile polyp s/p polypectomy (Bx- TA), R hemi, mild pancolonic div. Past Medical History:  Diagnosis Date  . Adjustment disorder   . Anxiety disorder   . CAD (coronary artery disease)   . Chest pain   . Colon cancer ([place])   . Depression   . Dysphonia   . Dysthymic disorder   . Gastroesophageal reflux disease   . Hyperlipidemia   . Insomnia   . MRSA (methicillin resistant Staphylococcus aureus)   . Neuropathy   . Obesity   . Pneumonia   . Pre-syncope     Past Surgical History:  Procedure Laterality Date  . ABDOMINAL HYSTERECTOMY    . BACK SURGERY    . BLADDER SURGERY    . CATARACT EXTRACTION, BILATERAL    . COLON SURGERY     Due to colon cancer  . COLONOSCOPY  04/09/2015   Colonic polyp status post polypectomy. Mild pancolonic diverticulosis/ History of colon cancer status post right hemicolectomy. No evidence of recurrence   . ESOPHAGOGASTRODUODENOSCOPY  05/26/2017   Schatzkis ring  statust post esophageal dilatation. Hiatal hernia.   Marland Kitchen PORT-A-CATH REMOVAL    . TUBAL LIGATION      Family History  Problem Relation Age of Onset  . CAD Mother        Age of Onset 13  . Hypertension Mother   . Kidney disease Mother   . CVA Brother   . Heart failure Father   . COPD Father   . Colon cancer Neg Hx     Social History   Tobacco Use  . Smoking status: Never Smoker  . Smokeless tobacco: Never Used  Substance Use Topics  . Alcohol use: No  . Drug use: No    Current Outpatient Medications  Medication Sig Dispense Refill  . aspirin EC 81 MG tablet Take 81 mg by mouth daily.    . BELSOMRA 10 MG TABS Take 1 tablet by mouth at bedtime as needed.    Marland Kitchen CALCIUM PO Take 1 tablet by mouth daily.    . Cholecalciferol (VITAMIN D3) 2000 units TABS Take 2,000 Units by mouth daily.    . Cyanocobalamin (VITAMIN B-12 PO) Take by mouth daily.    Marland Kitchen dexlansoprazole (DEXILANT) 60 MG capsule Take 1 capsule (60 mg total) by mouth daily. 90 capsule 3  . DULoxetine (CYMBALTA) 60 MG capsule Take 60 mg by mouth daily.  1  . famotidine (PEPCID)  20 MG tablet Take 1 tablet (20 mg total) by mouth at bedtime. 30 tablet 0  . fluticasone (FLONASE) 50 MCG/ACT nasal spray Place 1 spray into both nostrils 2 (two) times daily as needed for allergies or rhinitis.   0  . gabapentin (NEURONTIN) 800 MG tablet Take 800 mg by mouth 3 (three) times daily.   1  . meloxicam (MOBIC) 15 MG tablet Take 15 mg by mouth daily.    . metoprolol succinate (TOPROL XL) 25 MG 24 hr tablet Take 1 tablet (25 mg total) by mouth 2 (two) times daily. 60 tablet 11  . Multiple Vitamins-Minerals (CENTRUM SILVER 50+WOMEN) TABS Take 1 tablet by mouth daily.    . rosuvastatin (CRESTOR) 20 MG tablet Take 20 mg by mouth at bedtime.    . triamcinolone cream (KENALOG) 0.1 % Apply 1 application topically See admin instructions. TO AFFECTED AREA(S) DAILY AS DIRECTED    . nitroGLYCERIN (NITROSTAT) 0.4 MG SL tablet Place 0.4 mg under the  tongue every 5 (five) minutes x 3 doses as needed for chest pain.      No current facility-administered medications for this visit.    Allergies  Allergen Reactions  . Penicillins Rash    Has patient had a PCN reaction causing immediate rash, facial/tongue/throat swelling, SOB or lightheadedness with hypotension: Yes Has patient had a PCN reaction causing severe rash involving mucus membranes or skin necrosis: No Has patient had a PCN reaction that required hospitalization: No Has patient had a PCN reaction occurring within the last 10 years: No If all of the above answers are "NO", then may proceed with Cephalosporin use.   . Codeine Other (See Comments)    Dizziness and lethargy   . Guaifenesin & Derivatives Rash  . Latex Rash    Usually from adhesive tapes  . Other Rash    Tape cannot be on the skin for an extended period of time  . Pseudoephedrine Rash  . Sulfa Antibiotics Rash  . Sulfasalazine Rash  . Tape Rash    Cannot be on the skin for an extended period of time    Review of Systems:  neg     Physical Exam:    Ht 5' (1.524 m)   Wt 165 lb (74.8 kg)   BMI 32.22 kg/m  Filed Weights   07/31/19 1325  Weight: 165 lb (74.8 kg)   Televisit      Carmell Austria, MD 07/31/2019, 2:07 PM  Cc: Charlynn Court, NP

## 2019-07-31 NOTE — Addendum Note (Signed)
Addended by: Karena Addison on: 07/31/2019 02:20 PM   Modules accepted: Orders

## 2019-07-31 NOTE — Patient Instructions (Signed)
If you are age 66 or older, your body mass index should be between 23-30. Your Body mass index is 32.22 kg/m. If this is out of the aforementioned range listed, please consider follow up with your Primary Care Provider.  If you are age 103 or younger, your body mass index should be between 19-25. Your Body mass index is 32.22 kg/m. If this is out of the aformentioned range listed, please consider follow up with your Primary Care Provider.   We have sent the following medications to your pharmacy for you to pick up at your convenience: [place]  Follow up in September 2021.  Thank you,  Dr. Jackquline Denmark

## 2019-09-21 DIAGNOSIS — C182 Malignant neoplasm of ascending colon: Secondary | ICD-10-CM | POA: Diagnosis not present

## 2020-03-24 ENCOUNTER — Encounter: Payer: Self-pay | Admitting: Gastroenterology

## 2020-05-06 ENCOUNTER — Ambulatory Visit (AMBULATORY_SURGERY_CENTER): Payer: Self-pay | Admitting: *Deleted

## 2020-05-06 ENCOUNTER — Other Ambulatory Visit: Payer: Self-pay

## 2020-05-06 VITALS — Ht 60.0 in | Wt 159.6 lb

## 2020-05-06 DIAGNOSIS — Z85038 Personal history of other malignant neoplasm of large intestine: Secondary | ICD-10-CM

## 2020-05-06 MED ORDER — CLENPIQ 10-3.5-12 MG-GM -GM/160ML PO SOLN: 1.0000 | ORAL | 0 refills | Status: DC

## 2020-05-06 NOTE — Progress Notes (Signed)
Patient denies any allergies to egg or soy products. Patient denies complications with anesthesia/sedation.  Patient denies oxygen use at home and denies diet medications. Emmi instructions for colonoscopy explained but patient denied information.  Patient has been fully vaccinated with [employer].  Booster injection given on 03/21/20.

## 2020-05-07 ENCOUNTER — Encounter: Payer: Self-pay | Admitting: Gastroenterology

## 2020-05-14 ENCOUNTER — Other Ambulatory Visit: Payer: Self-pay

## 2020-05-14 ENCOUNTER — Ambulatory Visit (AMBULATORY_SURGERY_CENTER): Payer: Medicare HMO | Admitting: Gastroenterology

## 2020-05-14 ENCOUNTER — Encounter: Payer: Self-pay | Admitting: Gastroenterology

## 2020-05-14 VITALS — BP 104/65 | HR 63 | Temp 96.2°F | Resp 20 | Ht 60.0 in | Wt 159.6 lb

## 2020-05-14 DIAGNOSIS — Z85038 Personal history of other malignant neoplasm of large intestine: Secondary | ICD-10-CM

## 2020-05-14 DIAGNOSIS — K5000 Crohn's disease of small intestine without complications: Secondary | ICD-10-CM | POA: Diagnosis not present

## 2020-05-14 DIAGNOSIS — K529 Noninfective gastroenteritis and colitis, unspecified: Secondary | ICD-10-CM | POA: Diagnosis not present

## 2020-05-14 MED ORDER — SODIUM CHLORIDE 0.9 % IV SOLN: 500.0000 mL | Freq: Once | INTRAVENOUS | Status: DC

## 2020-05-14 NOTE — Progress Notes (Signed)
VS by Gervais.   No changes to medical or social hx since previsit.

## 2020-05-14 NOTE — Patient Instructions (Signed)
Handouts on diverticulosis & hemorrhoids given to you today  Await biopsy results    YOU HAD AN ENDOSCOPIC PROCEDURE TODAY AT [facility]:   Refer to the procedure report that was given to you for any specific questions about what was found during the examination.  If the procedure report does not answer your questions, please call your gastroenterologist to clarify.  If you requested that your care partner not be given the details of your procedure findings, then the procedure report has been included in a sealed envelope for you to review at your convenience later.  YOU SHOULD EXPECT: Some feelings of bloating in the abdomen. Passage of more gas than usual.  Walking can help get rid of the air that was put into your GI tract during the procedure and reduce the bloating. If you had a lower endoscopy (such as a colonoscopy or flexible sigmoidoscopy) you may notice spotting of blood in your stool or on the toilet paper. If you underwent a bowel prep for your procedure, you may not have a normal bowel movement for a few days.  Please Note:  You might notice some irritation and congestion in your nose or some drainage.  This is from the oxygen used during your procedure.  There is no need for concern and it should clear up in a day or so.  SYMPTOMS TO REPORT IMMEDIATELY:   Following lower endoscopy (colonoscopy or flexible sigmoidoscopy):  Excessive amounts of blood in the stool  Significant tenderness or worsening of abdominal pains  Swelling of the abdomen that is new, acute  Fever of 100F or higher    For urgent or emergent issues, a gastroenterologist can be reached at any hour by calling [phone]. Do not use MyChart messaging for urgent concerns.    DIET:  We do recommend a small meal at first, but then you may proceed to your regular diet.  Drink plenty of fluids but you should avoid alcoholic beverages for 24 hours.  ACTIVITY:  You should plan to take it  easy for the rest of today and you should NOT DRIVE or use heavy machinery until tomorrow (because of the sedation medicines used during the test).    FOLLOW UP: Our staff will call the number listed on your records 48-72 hours following your procedure to check on you and address any questions or concerns that you may have regarding the information given to you following your procedure. If we do not reach you, we will leave a message.  We will attempt to reach you two times.  During this call, we will ask if you have developed any symptoms of COVID 19. If you develop any symptoms (ie: fever, flu-like symptoms, shortness of breath, cough etc.) before then, please call [phone].  If you test positive for Covid 19 in the 2 weeks post procedure, please call and report this information to [place].    If any biopsies were taken you will be contacted by phone or by letter within the next 1-3 weeks.  Please call us at [phone] if you have not heard about the biopsies in 3 weeks.    SIGNATURES/CONFIDENTIALITY: You and/or your care partner have signed paperwork which will be entered into your electronic medical record.  These signatures attest to the fact that that the information above on your After Visit Summary has been reviewed and is understood.  Full responsibility of the confidentiality of this discharge information lies with you and/or your care-partner.

## 2020-05-14 NOTE — Progress Notes (Signed)
pt tolerated well. VSS. awake and to recovery. Report given to RN.  

## 2020-05-14 NOTE — Op Note (Signed)
[place] Patient Name: [name] Procedure Date: 05/14/2020 9:01 AM MRN: [id number] Endoscopist: Jackquline Denmark , MD Age: 64 Referring MD:  Date of Birth: 1956/02/16 Gender: Female Account #: [id number] Procedure:                Colonoscopy Indications:              High risk colon cancer surveillance: Personal                            history of colon cancer (stage III) s/p right                            hemicolectomy 2012 Medicines:                Monitored Anesthesia Care Procedure:                Pre-Anesthesia Assessment:                           - Prior to the procedure, a History and Physical                            was performed, and patient medications and                            allergies were reviewed. The patient's tolerance of                            previous anesthesia was also reviewed. The risks                            and benefits of the procedure and the sedation                            options and risks were discussed with the patient.                            All questions were answered, and informed consent                            was obtained. Prior Anticoagulants: The patient has                            taken no previous anticoagulant or antiplatelet                            agents. ASA Grade Assessment: III - A patient with                            severe systemic disease. After reviewing the risks                            and benefits, the patient was deemed in  satisfactory condition to undergo the procedure.                           After obtaining informed consent, the colonoscope                            was passed under direct vision. Throughout the                            procedure, the patient's blood pressure, pulse, and                            oxygen saturations were monitored continuously. The                            Colonoscope was introduced through the anus and                             advanced to the 1 cm into the neo- ileum. The                            colonoscopy was performed without difficulty. The                            patient tolerated the procedure well. The quality                            of the bowel preparation was good. The anastomosis,                            neo-TI and rectum were photographed. Scope In: 9:10:56 AM Scope Out: 9:24:40 AM Scope Withdrawal Time: 0 hours 10 minutes 5 seconds  Total Procedure Duration: 0 hours 13 minutes 44 seconds  Findings:                 Multiple medium-mouthed diverticula were found in                            the sigmoid colon, descending colon, transverse                            colon and ascending colon.                           There was evidence of a prior end-to-side                            ileo-colonic anastomosis in the ascending colon and                            in the cecum. Few small aphthous erosions were                            noted in neo-TI. biopsies were taken with a cold  forceps for histology.                           Non-bleeding internal hemorrhoids were found during                            retroflexion. The hemorrhoids were small.                           The exam was otherwise without abnormality. Complications:            No immediate complications. Estimated Blood Loss:     Estimated blood loss: none. Impression:               -Pancolonic diverticulosis.                           -S/P right hemicolectomy.                           -Few erosions and neo-TI ?Importance (biopsied)                           -Non-bleeding internal hemorrhoids.                           -The examination was otherwise normal to                            anastomosis. No recurrence. Recommendation:           - Patient has a contact number available for                            emergencies. The signs and symptoms of potential                             delayed complications were discussed with the                            patient. Return to normal activities tomorrow.                            Written discharge instructions were provided to the                            patient.                           - Resume previous diet.                           - Continue present medications.                           - Await pathology results.                           - Repeat colonoscopy in 5 years for screening  purposes.                           - Return to GI clinic PRN.                           - The findings and recommendations were discussed                            with the patient's family. Jackquline Denmark, MD 05/14/2020 9:34:12 AM This report has been signed electronically.

## 2020-05-14 NOTE — Progress Notes (Signed)
Called to room to assist during endoscopic procedure.  Patient ID and intended procedure confirmed with present staff. Received instructions for my participation in the procedure from the performing physician.  

## 2020-05-16 ENCOUNTER — Telehealth: Payer: Self-pay

## 2020-05-16 NOTE — Telephone Encounter (Signed)
°  Follow up Call-  Call back number 05/14/2020  Post procedure Call Back phone  # [phone]  Permission to leave phone message Yes  Some recent data might be hidden     Patient questions:  Do you have a fever, pain , or abdominal swelling? No. Pain Score  0 *  Have you tolerated food without any problems? Yes.    Have you been able to return to your normal activities? Yes.    Do you have any questions about your discharge instructions: Diet   No. Medications  No. Follow up visit  No.  Do you have questions or concerns about your Care? No.  Actions: * If pain score is 4 or above: No action needed, pain <4.  1. Have you developed a fever since your procedure? no  2.   Have you had an respiratory symptoms (SOB or cough) since your procedure? no  3.   Have you tested positive for COVID 19 since your procedure no  4.   Have you had any family members/close contacts diagnosed with the COVID 19 since your procedure?  no   If yes to any of these questions please route to Joylene John, RN and Joella Prince, RN

## 2020-05-28 ENCOUNTER — Encounter: Payer: Self-pay | Admitting: Gastroenterology

## 2020-05-28 DIAGNOSIS — F419 Anxiety disorder, unspecified: Secondary | ICD-10-CM | POA: Insufficient documentation

## 2020-05-28 DIAGNOSIS — R079 Chest pain, unspecified: Secondary | ICD-10-CM | POA: Insufficient documentation

## 2020-05-28 DIAGNOSIS — R490 Dysphonia: Secondary | ICD-10-CM | POA: Insufficient documentation

## 2020-05-28 DIAGNOSIS — K219 Gastro-esophageal reflux disease without esophagitis: Secondary | ICD-10-CM | POA: Insufficient documentation

## 2020-05-28 DIAGNOSIS — H269 Unspecified cataract: Secondary | ICD-10-CM | POA: Insufficient documentation

## 2020-05-28 DIAGNOSIS — N200 Calculus of kidney: Secondary | ICD-10-CM | POA: Insufficient documentation

## 2020-05-28 DIAGNOSIS — G4700 Insomnia, unspecified: Secondary | ICD-10-CM | POA: Insufficient documentation

## 2020-05-28 DIAGNOSIS — A4902 Methicillin resistant Staphylococcus aureus infection, unspecified site: Secondary | ICD-10-CM | POA: Insufficient documentation

## 2020-05-28 DIAGNOSIS — G629 Polyneuropathy, unspecified: Secondary | ICD-10-CM | POA: Insufficient documentation

## 2020-05-28 DIAGNOSIS — F4320 Adjustment disorder, unspecified: Secondary | ICD-10-CM | POA: Insufficient documentation

## 2020-05-28 DIAGNOSIS — F341 Dysthymic disorder: Secondary | ICD-10-CM | POA: Insufficient documentation

## 2020-05-29 ENCOUNTER — Other Ambulatory Visit: Payer: Self-pay

## 2020-05-29 ENCOUNTER — Encounter: Payer: Self-pay | Admitting: Cardiology

## 2020-05-29 ENCOUNTER — Ambulatory Visit: Payer: Medicare HMO | Admitting: Cardiology

## 2020-05-29 VITALS — BP 118/82 | HR 67 | Ht 60.0 in | Wt 161.0 lb

## 2020-05-29 DIAGNOSIS — E782 Mixed hyperlipidemia: Secondary | ICD-10-CM

## 2020-05-29 DIAGNOSIS — I2510 Atherosclerotic heart disease of native coronary artery without angina pectoris: Secondary | ICD-10-CM | POA: Diagnosis not present

## 2020-05-29 DIAGNOSIS — I712 Thoracic aortic aneurysm, without rupture: Secondary | ICD-10-CM | POA: Diagnosis not present

## 2020-05-29 DIAGNOSIS — R7303 Prediabetes: Secondary | ICD-10-CM

## 2020-05-29 DIAGNOSIS — I7121 Aneurysm of the ascending aorta, without rupture: Secondary | ICD-10-CM

## 2020-05-29 HISTORY — DX: Aneurysm of the ascending aorta, without rupture: I71.21

## 2020-05-29 NOTE — Progress Notes (Signed)
Cardiology Office Note:    Date:  05/29/2020   ID:  [name], DOB Apr 18, 1956, MRN [id number]  PCP:  Charlynn Court, NP  Cardiologist:  Jenne Campus, MD    Referring MD: Charlynn Court, NP   No chief complaint on file. I am doing well but I fell down  History of Present Illness:    [name] is a 64 y.o. female with past medical history significant for chronic back problem that eventually required surgery.  She still suffering from that.  She did have a cardiac catheterization 2000 with some 40% blockages then cardiac catheterization repeated 2018 showing no significant abnormalities.  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 Comes today 2 months of follow-up.  Overall she is doing well from cardiac standpoint reviewed there is no chest pain tightness squeezing pressure burning chest.  She does have seen her last time which was almost a year ago she fell down twice she did not passed out she did get dizzy so she simply tripped 1st time she ended going to the emergency room multiple bruises however CT of the abdomen CT of head did not show any serious injury and she was discharged home.  I did review that the emergency room record for it.  2nd time she tripped and she broke her left foot actually she is wearing both today.  Past Medical History:  Diagnosis Date  . Adjustment disorder   . Anesthesia complication 12/20/6281   Overview:  "It takes me a while to wake up."  Formatting of this note might be different from the original. "It takes me a while to wake up."  . Anxiety 01/09/2018  . Anxiety disorder   . Arthritis    hands, back  . Atypical chest pain 12/19/2016  . Borderline diabetes 01/09/2018  . CAD (coronary artery disease)   . Cancer ([place]) 01/09/2018   Overview:  colon  Formatting of this note might be different from the original. colon  . Cataract     removed by surgery  . Chest pain   . Colon cancer ([facility]) 2012  . Depression   . Depression 01/09/2018  . Dysphonia   . Dysthymic disorder   . Fatty liver 01/09/2018  . 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 Gastroesophageal reflux disease   . Hiatal hernia with GERD 01/09/2018  . History of kidney stones 01/09/2018  . Hyperlipidemia   . Insomnia   . Kidney stones    removed by surgery  . MRSA (methicillin resistant Staphylococcus aureus)   . MRSA infection 01/09/2018   Overview:  states winter of 2016, treated at [facility] by Lucita Lora NP  Formatting of this note might be different from the original. states winter of 2016, treated at [facility] by Lucita Lora NP  . Neuropathy   . Obesity   . Palpitations 01/20/2017  . Pneumonia   . Pre-syncope   . Sleep apnea     Past Surgical History:  Procedure Laterality Date  . ABDOMINAL HYSTERECTOMY    . BACK SURGERY    . BLADDER SURGERY    . CATARACT EXTRACTION, BILATERAL    . COLON SURGERY  2012   Due to colon cancer stage 3  . COLONOSCOPY  04/09/2015   Colonic polyp status post polypectomy. Mild pancolonic diverticulosis/ History of colon cancer status post right hemicolectomy. No evidence of recurrence   .  ESOPHAGOGASTRODUODENOSCOPY  05/26/2017   Schatzkis ring statust post esophageal dilatation. Hiatal hernia.   Marland Kitchen PORT-A-CATH REMOVAL    . removal kidney stones    . TUBAL LIGATION      Current Medications: No outpatient medications have been marked as taking for the 05/29/20 encounter (Appointment) with Park Liter, MD.     Allergies:   Penicillins, Codeine, Guaifenesin & derivatives, Latex, Other, Pseudoephedrine, Sulfa antibiotics, Sulfasalazine, and Tape   Social History   Socioeconomic History  . Marital status: Widowed    Spouse name: Not on file  . Number of children: Not on file  . Years of education: Not on file  . Highest education level: Not on file  Occupational History  . Not on file   Tobacco Use  . Smoking status: Never Smoker  . Smokeless tobacco: Never Used  Vaping Use  . Vaping Use: Never used  Substance and Sexual Activity  . Alcohol use: No  . Drug use: No  . Sexual activity: Not Currently    Birth control/protection: Post-menopausal  Other Topics Concern  . Not on file  Social History Narrative  . Not on file   Social Determinants of Health   Financial Resource Strain: Not on file  Food Insecurity: Not on file  Transportation Needs: Not on file  Physical Activity: Not on file  Stress: Not on file  Social Connections: Not on file     Family History: The patient's family history includes CAD in her mother; COPD in her father; CVA in her brother; Heart failure in her father; Hypertension in her mother; Kidney disease in her mother. There is no history of Colon cancer, Rectal cancer, or Stomach cancer. ROS:   Please see the history of present illness.    All 14 point review of systems negative except as described per history of present illness  EKGs/Labs/Other Studies Reviewed:      Recent Labs: No results found for requested labs within last 8760 hours.  Recent Lipid Panel No results found for: CHOL, TRIG, HDL, CHOLHDL, VLDL, LDLCALC, LDLDIRECT  Physical Exam:    VS:  LMP  (LMP Unknown)     Wt Readings from Last 3 Encounters:  05/14/20 159 lb 9.6 oz (72.4 kg)  05/06/20 159 lb 9.6 oz (72.4 kg)  07/31/19 165 lb (74.8 kg)     GEN:  Well nourished, well developed in no acute distress HEENT: Normal NECK: No JVD; No carotid bruits LYMPHATICS: No lymphadenopathy CARDIAC: RRR, no murmurs, no rubs, no gallops RESPIRATORY:  Clear to auscultation without rales, wheezing or rhonchi  ABDOMEN: Soft, non-tender, non-distended MUSCULOSKELETAL:  No edema; No deformity  SKIN: Warm and dry LOWER EXTREMITIES: no swelling NEUROLOGIC:  Alert and oriented x 3 PSYCHIATRIC:  Normal affect   ASSESSMENT:    1. Coronary artery disease involving native  coronary artery of native heart without angina pectoris   2. Ascending aortic aneurysm (HCC) 38 mm based on echocardiogram from 2021   3. Mixed hyperlipidemia   4. Borderline diabetes    PLAN:    In order of problems listed above:  1. Coronary artery disease apparently in 2000 she had cardiac catheterization with some up to 40% stenosis, then cardiac catheterization 2018 showing no significant stenosis.  The key is risk factors modification since she is asymptomatic, she is on antiplatelet therapy which I will continue. 2. Ascending aortic aneurysm measuring 38 mm based on echocardiogram done at the beginning of 2021.  We will repeat echocardiogram next year.  Her blood pressure appears to be well controlled she understand about avoiding isovolumetric exercises. 3. Mixed dyslipidemia: I did review her K PN showing LDL of 42 HDL 69 but that is all data, I will call to ask her primary care physician to get more updated information about her cholesterol. 4.  5. Diabetes again I call primary care physician and get her fasting lipid profile as well as hemoglobin A1c.   Medication Adjustments/Labs and Tests Ordered: Current medicines are reviewed at length with the patient today.  Concerns regarding medicines are outlined above.  No orders of the defined types were placed in this encounter.  Medication changes: No orders of the defined types were placed in this encounter.   Signed, Park Liter, MD, [facility] 05/29/2020 1:18 PM    [place]

## 2020-05-29 NOTE — Patient Instructions (Signed)

## 2020-06-30 ENCOUNTER — Telehealth: Payer: Self-pay | Admitting: Oncology

## 2020-06-30 NOTE — Telephone Encounter (Signed)
1/17 Spoke with patient and moved lab appt

## 2020-07-01 ENCOUNTER — Other Ambulatory Visit: Payer: Self-pay

## 2020-07-01 ENCOUNTER — Inpatient Hospital Stay: Payer: Medicare HMO

## 2020-07-01 ENCOUNTER — Other Ambulatory Visit: Payer: Self-pay | Admitting: Hematology and Oncology

## 2020-07-01 ENCOUNTER — Inpatient Hospital Stay: Payer: Medicare HMO | Attending: Hematology and Oncology

## 2020-07-01 DIAGNOSIS — Z85038 Personal history of other malignant neoplasm of large intestine: Secondary | ICD-10-CM | POA: Diagnosis present

## 2020-07-01 DIAGNOSIS — C182 Malignant neoplasm of ascending colon: Secondary | ICD-10-CM

## 2020-07-02 LAB — CEA: CEA: 7.5 ng/mL — ABNORMAL HIGH (ref 0.0–4.7)

## 2020-07-07 ENCOUNTER — Other Ambulatory Visit: Payer: Self-pay | Admitting: Oncology

## 2020-07-07 DIAGNOSIS — C182 Malignant neoplasm of ascending colon: Secondary | ICD-10-CM

## 2020-07-07 DIAGNOSIS — C801 Malignant (primary) neoplasm, unspecified: Secondary | ICD-10-CM

## 2020-07-08 ENCOUNTER — Telehealth: Payer: Self-pay

## 2020-07-08 NOTE — Telephone Encounter (Signed)
Patient notified of CEA results and that a CT scan of Chest/abd/pelv. Denise aware of CT scan and she will call patient once it is approved and scheduled. Patient voiced her understanding.

## 2020-07-08 NOTE — Telephone Encounter (Signed)
-----   Message from Derwood Kaplan, MD sent at 07/07/2020  6:09 PM EST ----- Regarding: call pt Tell her CEA went up from 5.2 to 7.5, so I rec we schedule CT scans of chest, abd.and pelvis again. The difference could be that sent to different lab.  So let's draw repeat labs day of scan and schedule with me next week

## 2020-07-22 ENCOUNTER — Encounter: Payer: Self-pay | Admitting: Oncology

## 2020-07-31 NOTE — Progress Notes (Signed)
[facility]  [address] [place],  [place]  [address] [phone]  Clinic Day:  08/01/2020  Referring physician: Charlynn Court, NP  This document serves as a record of services personally performed by Hosie Poisson, MD. It was created on their behalf by Curry,Lauren E, a trained medical scribe. The creation of this record is based on the scribe's personal observations and the provider's statements to them.  CHIEF COMPLAINT:  CC: History of stage IIIB colon cancer  Current Treatment:  Surveillance   HISTORY OF PRESENT ILLNESS:  [name] is a 65 y.o. 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  She was treated with surgical resection.  Pathology revealed a 3.6 centimeter, grade 2, adenocarcinoma with 3 of 45 nodes positive for metastasis.  She received adjuvant chemotherapy with FOLFOX for 5 cycles.  Oxaliplatin was then discontinued because of severe cytopenias.  She had a continued 5 fluorouracil and leucovorin for 4 more cycles, but then developed a severe allergic reaction to the 5-FU.  She was then placed on Xeloda for 6 weeks to complete a full 6 months of adjuvant chemotherapy.  She also had difficulty tolerating the Xeloda.  She has not had evidence of recurrence.  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  The patient is a nonsmoker.  She also had a CT abdomen/pelvis in June 2016 for abdominal pain, but this was negative other than a small hiatal hernia.  MRI of the lumbar spine in July revealed left sided neural impingement at L1-2 with multilevel spondylosis.  Her CEA has been running 5.0 to 5.5 over the last year, but we have found no evidence of malignancy.  The last one went up to 7.0 and so I ordered CT scans.     INTERVAL HISTORY:  [name] is here for routine follow up and states that she has been well other than intermittent right flank pain.  However, lab work from earlier this month revealed a rising CEA of 7.0, previously 5.2, and so imaging was obtained.  CT chest, abdomen and pelvis from February 9th revealed a stable exam with no evidence of local recurrence of metastatic disease.  Colonoscopy with Dr. Lyndel Safe from December 1st was unremarkable, and repeat in 5 years was recommended.  Bilateral mammogram from September 2021 was clear.  Her  appetite is good, and she has gained 4 and 1/2 pounds since her last visit.  She denies fever, chills or other signs of infection.  She denies nausea, vomiting, bowel issues, or abdominal pain.  She denies sore throat, cough, dyspnea, or chest pain.  REVIEW OF SYSTEMS:  Review of Systems  Constitutional: Negative.  Negative for appetite change, chills, fatigue, fever and unexpected weight change.  HENT:  Negative.   Eyes: Negative.   Respiratory: Negative.  Negative for chest tightness, cough, hemoptysis, shortness of breath and wheezing.   Cardiovascular: Negative.  Negative for chest pain, leg swelling and palpitations.  Gastrointestinal: Negative.  Negative for abdominal distention, abdominal pain, blood in stool, constipation, diarrhea, nausea and vomiting.  Endocrine: Negative.   Musculoskeletal: Positive for flank pain (right, on occasion). Negative for arthralgias, back pain, gait problem and myalgias.  Skin: Negative.   Neurological: Negative.  Negative for dizziness, extremity weakness, gait  problem, headaches, light-headedness, numbness, seizures and speech difficulty.  Hematological: Negative.   Psychiatric/Behavioral: Negative.  Negative for depression and sleep disturbance. The patient is not nervous/anxious.      VITALS:  Blood pressure (!) 181/84, pulse 65, temperature 98.2 F (36.8 C), temperature source Oral, resp. rate 18, height 5' (1.524  m), weight 169 lb 11.2 oz (77 kg), SpO2 96 %.  Wt Readings from Last 3 Encounters:  08/01/20 169 lb 11.2 oz (77 kg)  05/29/20 161 lb (73 kg)  05/14/20 159 lb 9.6 oz (72.4 kg)    Body mass index is 33.14 kg/m.  Performance status (ECOG): 0 - Asymptomatic  PHYSICAL EXAM:  Physical Exam Constitutional:      General: She is not in acute distress.    Appearance: Normal appearance. She is normal weight.  HENT:     Head: Normocephalic and atraumatic.  Eyes:     General: No scleral icterus.    Extraocular Movements: Extraocular movements intact.     Conjunctiva/sclera: Conjunctivae normal.     Pupils: Pupils are equal, round, and reactive to light.  Cardiovascular:     Rate and Rhythm: Normal rate and regular rhythm.     Pulses: Normal pulses.     Heart sounds: Normal heart sounds. No murmur heard. No friction rub. No gallop.   Pulmonary:     Effort: Pulmonary effort is normal. No respiratory distress.     Breath sounds: Normal breath sounds.  Abdominal:     General: Bowel sounds are normal. There is no distension.     Palpations: Abdomen is soft. There is no hepatomegaly, splenomegaly or mass.     Tenderness: There is no abdominal tenderness.  Musculoskeletal:        General: Normal range of motion.     Cervical back: Normal range of motion and neck supple.     Right lower leg: No edema.     Left lower leg: No edema.  Lymphadenopathy:     Cervical: No cervical adenopathy.  Skin:    General: Skin is warm and dry.  Neurological:     General: No focal deficit present.     Mental Status: She is alert and oriented to person, place, and time. Mental status is at baseline.  Psychiatric:        Mood and Affect: Mood normal.        Behavior: Behavior normal.        Thought Content: Thought content normal.        Judgment: Judgment normal.     LABS:   CBC Latest Ref Rng & Units 12/20/2016 12/19/2016  WBC 4.0 - 10.5 K/uL 8.8 11.6(H)  Hemoglobin 12.0 - 15.0 g/dL 14.0 13.6   Hematocrit 36.0 - 46.0 % 43.6 42.2  Platelets 150 - 400 K/uL 145(L) 144(L)   CMP Latest Ref Rng & Units 12/20/2016 12/19/2016  Glucose 65 - 99 mg/dL 108(H) 88  BUN 6 - 20 mg/dL 8 12  Creatinine 0.44 - 1.00 mg/dL 0.89 0.91  Sodium 135 - 145 mmol/L 138 138  Potassium 3.5 - 5.1 mmol/L 4.6 4.0  Chloride 101 - 111 mmol/L 105 105  CO2 22 - 32 mmol/L 27 21(L)  Calcium 8.9 - 10.3 mg/dL 9.0 8.6(L)  Total Protein 6.5 - 8.1 g/dL - 7.1  Total Bilirubin 0.3 - 1.2 mg/dL - 1.1  Alkaline Phos 38 - 126 U/L - 83  AST 15 - 41 U/L - 27  ALT 14 - 54 U/L - 19  Lab Results  Component Value Date   CEA1 7.5 (H) 07/01/2020   /  CEA  Date Value Ref Range Status  07/01/2020 7.5 (H) 0.0 - 4.7 ng/mL Final    Comment:    (NOTE)                             Nonsmokers          <3.9                             Smokers             <5.6 [employer] Electrochemiluminescence Immunoassay (ECLIA) Values obtained with different assay methods or kits cannot be used interchangeably.  Results cannot be interpreted as absolute evidence of the presence or absence of malignant disease. Performed At: [facility] [place], [place] [id number] Rush Farmer MD JI:[id number]     STUDIES:   She underwent bilateral screening mammogram with tomography on 02/21/2020 showing: breast density category C.  NO mammographic evidence of malignancy.  She underwent CT chest with contrast and CT abdomen and pelvis with and without contrast on 07/23/2020 showing: 1. Stable CTS of the chest, abdomen and pelvis. No evidence of local recurrence or metastatic disease. 2. Stable moderate to large hiatal hernia. 3. Aortic Atherosclerosis (ICD10-I70.0).   Allergies:  Allergies  Allergen Reactions  . Penicillins Rash    Has patient had a PCN reaction causing immediate rash, facial/tongue/throat swelling, SOB or lightheadedness with hypotension: Yes Has patient had a PCN reaction causing severe rash  involving mucus membranes or skin necrosis: No Has patient had a PCN reaction that required hospitalization: No Has patient had a PCN reaction occurring within the last 10 years: No If all of the above answers are "NO", then may proceed with Cephalosporin use.   . Codeine Other (See Comments)    Dizziness and lethargy   . Guaifenesin & Derivatives Rash  . Latex Rash    Usually from adhesive tapes  . Other Rash    Tape cannot be on the skin for an extended period of time  . Pseudoephedrine Rash  . Sulfa Antibiotics Rash  . Sulfasalazine Rash  . Tape Rash    Cannot be on the skin for an extended period of time    Current Medications: Current Outpatient Medications  Medication Sig Dispense Refill  . aspirin EC 81 MG tablet Take 81 mg by mouth daily.    . BELSOMRA 10 MG TABS Take 1 tablet by mouth at bedtime as needed.    Marland Kitchen CALCIUM PO Take 2 tablets by mouth daily.     . Cholecalciferol (VITAMIN D3) 2000 units TABS Take 2,000 Units by mouth daily.    Marland Kitchen CINNAMON PO Take by mouth daily.    . clonazePAM (KLONOPIN) 0.5 MG tablet Take 0.5 mg by mouth daily as needed.    Marland Kitchen dexlansoprazole (DEXILANT) 60 MG capsule Take 1 capsule (60 mg total) by mouth daily. 90 capsule 4  . DULoxetine (CYMBALTA) 60 MG capsule Take 60 mg by mouth daily.  1  . famotidine (PEPCID) 20 MG tablet Take 20 mg by mouth daily.    . fluticasone (FLONASE) 50 MCG/ACT nasal spray Place 1 spray into both nostrils 2 (two) times daily as needed for allergies or rhinitis.  0  . gabapentin (NEURONTIN) 800 MG tablet Take 800 mg by mouth 3 (three)  times daily.   1  . meloxicam (MOBIC) 15 MG tablet Take 15 mg by mouth daily.    . metoprolol succinate (TOPROL XL) 25 MG 24 hr tablet Take 1 tablet (25 mg total) by mouth 2 (two) times daily. 60 tablet 11  . Multiple Vitamins-Minerals (CENTRUM SILVER 50+WOMEN) TABS Take 1 tablet by mouth daily.    . nitroGLYCERIN (NITROSTAT) 0.4 MG SL tablet Place 0.4 mg under the tongue every 5  (five) minutes x 3 doses as needed for chest pain.     . rosuvastatin (CRESTOR) 20 MG tablet Take 20 mg by mouth at bedtime.    . triamcinolone cream (KENALOG) 0.1 % Apply 1 application topically See admin instructions. TO AFFECTED AREA(S) DAILY AS DIRECTED     No current facility-administered medications for this visit.     ASSESSMENT & PLAN:   Assessment:   1. History of stage IIIB colon cancer diagnosed in December 2012.  The patient remains without evidence of recurrence.      3. Depression/Anxiety.  4. 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  She underwent surgery with Dr. Sherlyn Lick on March 9th/10th 2020, but notes no improvement.  Plan: Her CEA has risen, but CT imaging remains negative for malignancy.  We will continue to monitor her and will plan to see her back in 3-4 months with a CBC, comprehensive metabolic panel and CEA.  The patient understands the plans discussed today and is in agreement with them.  She knows to contact our office if she develops concerns regarding her colon cancer.   I provided 20 minutes of face-to-face time during this this encounter and > 50% was spent counseling as documented under my assessment and plan.    Derwood Kaplan, MD [facility] AT [facility] [address] [place] [address] Dept: [phone] Dept Fax: [phone]   I, Rita Ohara, am acting as scribe for Derwood Kaplan, MD  I have reviewed this report as typed by the medical scribe, and it is complete and accurate.  Hermina Barters

## 2020-08-01 ENCOUNTER — Inpatient Hospital Stay: Payer: Medicare HMO | Attending: Hematology and Oncology | Admitting: Oncology

## 2020-08-01 ENCOUNTER — Encounter: Payer: Self-pay | Admitting: Oncology

## 2020-08-01 ENCOUNTER — Other Ambulatory Visit: Payer: Self-pay | Admitting: Oncology

## 2020-08-01 ENCOUNTER — Telehealth: Payer: Self-pay | Admitting: Oncology

## 2020-08-01 ENCOUNTER — Other Ambulatory Visit: Payer: Self-pay

## 2020-08-01 VITALS — BP 181/84 | HR 65 | Temp 98.2°F | Resp 18 | Ht 60.0 in | Wt 169.7 lb

## 2020-08-01 DIAGNOSIS — C184 Malignant neoplasm of transverse colon: Secondary | ICD-10-CM

## 2020-08-01 DIAGNOSIS — C182 Malignant neoplasm of ascending colon: Secondary | ICD-10-CM | POA: Diagnosis not present

## 2020-08-01 NOTE — Telephone Encounter (Signed)
Per 2/18 LOS, patient scheduled for May Appt's.  Gave patient Appt Summary.  Per Hinton Rao Cx April Appt's

## 2020-08-08 ENCOUNTER — Other Ambulatory Visit: Payer: Self-pay

## 2020-08-08 ENCOUNTER — Encounter: Payer: Self-pay | Admitting: Oncology

## 2020-08-08 MED ORDER — DEXLANSOPRAZOLE 60 MG PO CPDR: 60.0000 mg | DELAYED_RELEASE_CAPSULE | Freq: Every day | ORAL | 4 refills | Status: DC

## 2020-08-08 NOTE — Progress Notes (Signed)
Rx request from [place] was sent to pharmacy

## 2020-08-14 ENCOUNTER — Other Ambulatory Visit: Payer: Self-pay | Admitting: Gastroenterology

## 2020-08-14 ENCOUNTER — Telehealth: Payer: Self-pay | Admitting: Gastroenterology

## 2020-08-14 MED ORDER — FAMOTIDINE 20 MG PO TABS: 20.0000 mg | ORAL_TABLET | Freq: Every day | ORAL | 2 refills | Status: DC

## 2020-08-14 MED ORDER — DEXLANSOPRAZOLE 60 MG PO CPDR: 60.0000 mg | DELAYED_RELEASE_CAPSULE | Freq: Every day | ORAL | 2 refills | Status: DC

## 2020-08-14 NOTE — Telephone Encounter (Signed)
Patient needs to refill Famotidine and Dexilant for 90-day supply. Please send it to [facility] in [place].

## 2020-08-14 NOTE — Telephone Encounter (Signed)
Patient's PA was denied and insurance said that the manufacture is not participating in the Part D law gap exemption although she has been on it for almost 10 years. Is there any recommendations for this patient?  30 supply is 314 dollars and 90 supply is 948 dollars and that's generic. This is a Dr. Steve Rattler patient

## 2020-08-14 NOTE — Telephone Encounter (Signed)
Prior authorization per your request

## 2020-08-14 NOTE — Telephone Encounter (Signed)
Sent!

## 2020-08-15 NOTE — Telephone Encounter (Signed)
LVM for patient to call back. ?

## 2020-08-15 NOTE — Telephone Encounter (Signed)
[name], I am sorry to hear about this issue in regards to Medig[employer].  I am okay with Protonix or omeprazole. Talk with the patient and she is okay with trialing that that is fine.  If Dr. Lyndel Safe is still around he may have a final say or call as to what he would like but I think it is fine to get her Protonix or omeprazole 40 mg once daily to see if that works as long as the patient is willing to try. Thanks. GM

## 2020-08-18 MED ORDER — RABEPRAZOLE SODIUM 20 MG PO TBEC
20.0000 mg | DELAYED_RELEASE_TABLET | Freq: Every day | ORAL | 2 refills | Status: DC
Start: 2020-08-18 — End: 2020-10-31

## 2020-08-18 NOTE — Telephone Encounter (Signed)
If patient would answer her phone then she would know I have been calling since Thursday. Her dexilant has been denied and it would cost her 940 something dollars for a 90 day supply and 314 for a 30 day supply. I have talked to Dr Rush Landmark regarding this and he ok with switching patient to another medicaton but we need to talk to patient about this first before Dr Steve Rattler recommandation. They never said anything about her pepcid so she should have that

## 2020-08-18 NOTE — Telephone Encounter (Signed)
Medication Aciphex was sent to the pharmacy and patient still didn't answer phone. LVM

## 2020-08-18 NOTE — Addendum Note (Signed)
Addended by: Curlene Labrum E on: 08/18/2020 12:29 PM   Modules accepted: Orders

## 2020-08-18 NOTE — Telephone Encounter (Signed)
Called patient again and got voicemail

## 2020-08-18 NOTE — Telephone Encounter (Signed)
Patient called and states she spoke with [facility] and they do not have the medications please advise patient is in need of her medicine

## 2020-08-18 NOTE — Telephone Encounter (Signed)
Called patient back and got voicemail. Maybe she should answer her phone

## 2020-08-19 NOTE — Telephone Encounter (Signed)
2-3 week update

## 2020-08-19 NOTE — Telephone Encounter (Signed)
Patient made aware of medication change why and to call in 2-3 to give [place] an update

## 2020-09-02 DIAGNOSIS — C188 Malignant neoplasm of overlapping sites of colon: Secondary | ICD-10-CM | POA: Insufficient documentation

## 2020-09-03 ENCOUNTER — Telehealth: Payer: Self-pay | Admitting: Gastroenterology

## 2020-09-03 NOTE — Telephone Encounter (Signed)
Patient called and stated she was given Rabeprazole  But she got a reaction to it so she is now requesting to go back to [place].

## 2020-09-03 NOTE — Telephone Encounter (Signed)
Patient stated that she started the medication Aciphex on Sunday 3-20 and started breaking out in hives on Monday and said that she is hoarse.  What are your recommendations for this patient since [place] is costly due to insurance?

## 2020-09-04 NOTE — Telephone Encounter (Signed)
Lets go back to Protonix 40 mg p.o. once a day #30, 11 refills RG

## 2020-09-05 MED ORDER — DEXLANSOPRAZOLE 60 MG PO CPDR: 60.0000 mg | DELAYED_RELEASE_CAPSULE | Freq: Every day | ORAL | 3 refills | Status: DC

## 2020-09-05 NOTE — Telephone Encounter (Signed)
Called patient and she said that she talked to a nurse from [facility] about [place] and that she would like a script to be sent in. I told her that with insurance they were telling me it would be over 300 dollars for a 30 day supply. But she is insisting that I do. She said that she had to get a [place] shot and shot due to her hives and that she is sensitive to mediations. I told her I would send the script to [facility] and she needs to let us know something in a week

## 2020-09-09 NOTE — Telephone Encounter (Signed)
Patient said that she was able to get a 90 day script of dexilant for 9 dollars and some change. Told her we would continue to send the medication in then

## 2020-09-15 DIAGNOSIS — R490 Dysphonia: Secondary | ICD-10-CM | POA: Insufficient documentation

## 2020-09-19 ENCOUNTER — Ambulatory Visit: Payer: Medicare HMO | Admitting: Hematology and Oncology

## 2020-09-19 ENCOUNTER — Other Ambulatory Visit: Payer: Medicare HMO

## 2020-09-29 ENCOUNTER — Other Ambulatory Visit: Payer: Self-pay

## 2020-09-29 MED ORDER — DEXLANSOPRAZOLE 60 MG PO CPDR: 1.0000 | DELAYED_RELEASE_CAPSULE | Freq: Every day | ORAL | 2 refills | Status: DC

## 2020-10-29 ENCOUNTER — Inpatient Hospital Stay: Payer: Medicare HMO

## 2020-10-29 ENCOUNTER — Inpatient Hospital Stay: Payer: Medicare HMO | Admitting: Oncology

## 2020-10-30 ENCOUNTER — Encounter: Payer: Self-pay | Admitting: Hematology and Oncology

## 2020-10-30 ENCOUNTER — Other Ambulatory Visit: Payer: Self-pay

## 2020-10-30 ENCOUNTER — Telehealth: Payer: Self-pay | Admitting: Hematology and Oncology

## 2020-10-30 ENCOUNTER — Inpatient Hospital Stay (INDEPENDENT_AMBULATORY_CARE_PROVIDER_SITE_OTHER): Payer: Medicare HMO | Admitting: Hematology and Oncology

## 2020-10-30 ENCOUNTER — Inpatient Hospital Stay: Payer: Medicare HMO | Attending: Oncology

## 2020-10-30 VITALS — BP 131/78 | HR 60 | Temp 98.6°F | Resp 18 | Ht 60.0 in | Wt 166.1 lb

## 2020-10-30 DIAGNOSIS — F32A Depression, unspecified: Secondary | ICD-10-CM | POA: Diagnosis not present

## 2020-10-30 DIAGNOSIS — Z7982 Long term (current) use of aspirin: Secondary | ICD-10-CM | POA: Insufficient documentation

## 2020-10-30 DIAGNOSIS — Z79899 Other long term (current) drug therapy: Secondary | ICD-10-CM | POA: Insufficient documentation

## 2020-10-30 DIAGNOSIS — Z85038 Personal history of other malignant neoplasm of large intestine: Secondary | ICD-10-CM | POA: Diagnosis present

## 2020-10-30 DIAGNOSIS — Z9221 Personal history of antineoplastic chemotherapy: Secondary | ICD-10-CM | POA: Insufficient documentation

## 2020-10-30 DIAGNOSIS — M47816 Spondylosis without myelopathy or radiculopathy, lumbar region: Secondary | ICD-10-CM | POA: Diagnosis not present

## 2020-10-30 DIAGNOSIS — C184 Malignant neoplasm of transverse colon: Secondary | ICD-10-CM | POA: Diagnosis not present

## 2020-10-30 DIAGNOSIS — F419 Anxiety disorder, unspecified: Secondary | ICD-10-CM | POA: Diagnosis not present

## 2020-10-30 LAB — CBC WITH DIFFERENTIAL/PLATELET

## 2020-10-30 LAB — CBC AND DIFFERENTIAL
HCT: 40 (ref 36–46)
Hemoglobin: 13.1 (ref 12.0–16.0)
Neutrophils Absolute: 7.52
Platelets: 121 — AB (ref 150–399)
WBC: 9.9

## 2020-10-30 LAB — BASIC METABOLIC PANEL
BUN: 14 (ref 4–21)
CO2: 27 — AB (ref 13–22)
Chloride: 105 (ref 99–108)
Creatinine: 1 (ref 0.5–1.1)
Glucose: 93
Potassium: 4.4 (ref 3.4–5.3)
Sodium: 136 — AB (ref 137–147)

## 2020-10-30 LAB — COMPREHENSIVE METABOLIC PANEL
Albumin: 4 (ref 3.5–5.0)
Calcium: 9 (ref 8.7–10.7)

## 2020-10-30 LAB — CBC: RBC: 4.5 (ref 3.87–5.11)

## 2020-10-30 LAB — HEPATIC FUNCTION PANEL
ALT: 19 (ref 7–35)
AST: 30 (ref 13–35)
Alkaline Phosphatase: 82 (ref 25–125)
Bilirubin, Total: 0.7

## 2020-10-30 NOTE — Progress Notes (Signed)
[facility]  [address] [place],  [place]  [address] [phone]  Clinic Day:  10/30/2020  Referring physician: Charlynn Court, NP   CHIEF COMPLAINT:  CC: A 65 year old female with history of stage IIIB colon cancer here for 3 month evaluation.  Current Treatment:  Surveillance   HISTORY OF PRESENT ILLNESS:  [name] is a 65 y.o. 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  She was treated with surgical resection.  Pathology revealed a 3.6 centimeter, grade 2, adenocarcinoma with 3 of 45 nodes positive for metastasis.  She received adjuvant chemotherapy with FOLFOX for 5 cycles.  Oxaliplatin was then discontinued because of severe cytopenias.  She had a continued 5 fluorouracil and leucovorin for 4 more cycles, but then developed a severe allergic reaction to the 5-FU.  She was then placed on Xeloda for 6 weeks to complete a full 6 months of adjuvant chemotherapy.  She also had difficulty tolerating the Xeloda.  She has not had evidence of recurrence.  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  The patient is a nonsmoker.  She also had a CT abdomen/pelvis in June 2016 for abdominal pain, but this was negative other than a small hiatal hernia.  MRI of the lumbar spine in July revealed left sided neural impingement at L1-2 with multilevel spondylosis.  Her CEA has been running 5.0 to 5.5 over the last year, but we have found no evidence of malignancy.  The last one went up to 7.0 and so I ordered CT scans.    CT chest, abdomen and pelvis from February 9th revealed a stable exam with no evidence of local recurrence of metastatic disease.  Colonoscopy with Dr. Lyndel Safe from December 1st was unremarkable, and repeat in 5 years was  recommended.  Bilateral mammogram from September 2021 was clear.   INTERVAL HISTORY:  [name] is here for 3 month evaluation. She has been well since her last visit. She continues to have occasional right sided flank pain. She denies fever, chills, nausea or vomiting. She denies shortness of breath, chest pain or cough. She denies constipation or diarrhea. She remains active caring for her animals including donkeys. Her appetite is good. She has received 3 [place] vaccines. CBC and CMP are unremarkable today. CEA is pending.   REVIEW OF SYSTEMS:  Review of Systems  Constitutional: Negative for appetite change, chills, diaphoresis, fatigue, fever and unexpected weight change.  HENT:   Negative for hearing loss, lump/mass, mouth sores, nosebleeds, sore throat, tinnitus, trouble swallowing and voice change.   Eyes: Negative for eye problems and icterus.  Respiratory: Negative for chest tightness, cough, hemoptysis, shortness of breath and wheezing.   Cardiovascular: Negative for chest pain, leg swelling and palpitations.  Gastrointestinal: Negative for abdominal distention, abdominal pain, blood in stool, constipation, diarrhea, nausea, rectal pain and vomiting.  Endocrine: Negative for hot flashes.  Genitourinary: Negative for bladder incontinence, difficulty urinating, dyspareunia, dysuria, frequency, hematuria and nocturia.   Musculoskeletal: Positive for flank pain (right, on occasion). Negative for arthralgias, back pain, gait problem, myalgias, neck pain and neck stiffness.  Skin: Negative for itching, rash and wound.  Neurological: Negative for dizziness, extremity weakness, gait problem, headaches, light-headedness, numbness, seizures and speech difficulty.  Hematological:  Negative for adenopathy. Does not bruise/bleed easily.  Psychiatric/Behavioral: Negative for confusion, decreased concentration, depression, sleep disturbance and suicidal ideas. The patient is not nervous/anxious.       VITALS:  Blood pressure 131/78, pulse 60, temperature 98.6 F (37 C), temperature source Oral, resp. rate 18, height 5' (1.524 m), weight 166 lb 1.6 oz (75.3 kg), SpO2 98 %.  Wt Readings from Last 3 Encounters:  10/30/20 166 lb 1.6 oz (75.3 kg)  08/01/20 169 lb 11.2 oz (77 kg)  05/29/20 161 lb (73 kg)    Body mass index is 32.44 kg/m.  Performance status (ECOG): 0 - Asymptomatic  PHYSICAL EXAM:  Physical Exam Constitutional:      General: She is not in acute distress.    Appearance: Normal appearance. She is normal weight. She is not ill-appearing, toxic-appearing or diaphoretic.  HENT:     Head: Normocephalic and atraumatic.     Nose: Nose normal. No congestion or rhinorrhea.     Mouth/Throat:     Mouth: Mucous membranes are moist.     Pharynx: Oropharynx is clear. No oropharyngeal exudate or posterior oropharyngeal erythema.  Eyes:     General: No scleral icterus.       Right eye: No discharge.        Left eye: No discharge.     Extraocular Movements: Extraocular movements intact.     Conjunctiva/sclera: Conjunctivae normal.     Pupils: Pupils are equal, round, and reactive to light.  Neck:     Vascular: No carotid bruit.  Cardiovascular:     Rate and Rhythm: Normal rate and regular rhythm.     Pulses: Normal pulses.     Heart sounds: Normal heart sounds. No murmur heard. No friction rub. No gallop.   Pulmonary:     Effort: Pulmonary effort is normal. No respiratory distress.     Breath sounds: Normal breath sounds. No stridor. No wheezing, rhonchi or rales.  Chest:     Chest wall: No tenderness.  Abdominal:     General: Abdomen is flat. Bowel sounds are normal. There is no distension.     Palpations: Abdomen is soft. There is no hepatomegaly, splenomegaly or mass.     Tenderness: There is no abdominal tenderness. There is no right CVA tenderness, left CVA tenderness, guarding or rebound.     Hernia: No hernia is present.  Musculoskeletal:        General: No  swelling, tenderness, deformity or signs of injury. Normal range of motion.     Cervical back: Normal range of motion and neck supple. No rigidity or tenderness.     Right lower leg: No edema.     Left lower leg: No edema.  Lymphadenopathy:     Cervical: No cervical adenopathy.  Skin:    General: Skin is warm and dry.     Capillary Refill: Capillary refill takes less than 2 seconds.     Coloration: Skin is not jaundiced or pale.     Findings: No bruising, erythema, lesion or rash.  Neurological:     General: No focal deficit present.     Mental Status: She is alert and oriented to person, place, and time. Mental status is at baseline.     Cranial Nerves: No cranial nerve deficit.     Sensory: No sensory deficit.     Motor: No weakness.     Coordination: Coordination normal.     Gait: Gait normal.     Deep Tendon Reflexes: Reflexes  normal.  Psychiatric:        Mood and Affect: Mood normal.        Behavior: Behavior normal.        Thought Content: Thought content normal.        Judgment: Judgment normal.     LABS:   CBC Latest Ref Rng & Units 10/30/2020 12/20/2016 12/19/2016  WBC - 9.9 8.8 11.6(H)  Hemoglobin 12.0 - 16.0 13.1 14.0 13.6  Hematocrit 36 - 46 40 43.6 42.2  Platelets 150 - 399 121(A) 145(L) 144(L)   CMP Latest Ref Rng & Units 10/30/2020 12/20/2016 12/19/2016  Glucose 65 - 99 mg/dL - 108(H) 88  BUN 4 - 21 14 8 12   Creatinine 0.5 - 1.1 1.0 0.89 0.91  Sodium 137 - 147 136(A) 138 138  Potassium 3.4 - 5.3 4.4 4.6 4.0  Chloride 99 - 108 105 105 105  CO2 13 - 22 27(A) 27 21(L)  Calcium 8.7 - 10.7 9.0 9.0 8.6(L)  Total Protein 6.5 - 8.1 g/dL - - 7.1  Total Bilirubin 0.3 - 1.2 mg/dL - - 1.1  Alkaline Phos 25 - 125 82 - 83  AST 13 - 35 30 - 27  ALT 7 - 35 19 - 19     Lab Results  Component Value Date   CEA1 7.5 (H) 07/01/2020   /  CEA  Date Value Ref Range Status  07/01/2020 7.5 (H) 0.0 - 4.7 ng/mL Final    Comment:    (NOTE)                             Nonsmokers           <3.9                             Smokers             <5.6 [employer] Electrochemiluminescence Immunoassay (ECLIA) Values obtained with different assay methods or kits cannot be used interchangeably.  Results cannot be interpreted as absolute evidence of the presence or absence of malignant disease. Performed At: [facility] [place], [place] [id number] Rush Farmer MD [id number]     STUDIES:    Allergies:  Allergies  Allergen Reactions  . Penicillins Rash    Has patient had a PCN reaction causing immediate rash, facial/tongue/throat swelling, SOB or lightheadedness with hypotension: Yes Has patient had a PCN reaction causing severe rash involving mucus membranes or skin necrosis: No Has patient had a PCN reaction that required hospitalization: No Has patient had a PCN reaction occurring within the last 10 years: No If all of the above answers are "NO", then may proceed with Cephalosporin use.   . Codeine Other (See Comments)    Dizziness and lethargy   . Guaifenesin & Derivatives Rash  . Latex Rash    Usually from adhesive tapes  . Other Rash    Tape cannot be on the skin for an extended period of time  . Pseudoephedrine Rash  . Sulfa Antibiotics Rash  . Sulfasalazine Rash  . Tape Rash    Cannot be on the skin for an extended period of time    Current Medications: Current Outpatient Medications  Medication Sig Dispense Refill  . aspirin EC 81 MG tablet Take 81 mg by mouth daily.    . BELSOMRA 10 MG TABS Take 1 tablet by mouth at  bedtime as needed.    Marland Kitchen CALCIUM PO Take 2 tablets by mouth daily.     . Cholecalciferol (VITAMIN D3) 2000 units TABS Take 2,000 Units by mouth daily.    Marland Kitchen CINNAMON PO Take by mouth daily.    . clonazePAM (KLONOPIN) 0.5 MG tablet Take 0.5 mg by mouth daily as needed.    Marland Kitchen dexlansoprazole (DEXILANT) 60 MG capsule Take 1 capsule (60 mg total) by mouth daily. 90 capsule 2  . DULoxetine (CYMBALTA) 60 MG  capsule Take 60 mg by mouth daily.  1  . famotidine (PEPCID) 20 MG tablet Take 1 tablet (20 mg total) by mouth daily. 90 tablet 2  . fluticasone (FLONASE) 50 MCG/ACT nasal spray Place 1 spray into both nostrils 2 (two) times daily as needed for allergies or rhinitis.  0  . gabapentin (NEURONTIN) 800 MG tablet Take 800 mg by mouth 3 (three) times daily.   1  . meloxicam (MOBIC) 15 MG tablet Take 15 mg by mouth daily.    . metoprolol succinate (TOPROL XL) 25 MG 24 hr tablet Take 1 tablet (25 mg total) by mouth 2 (two) times daily. 60 tablet 11  . Multiple Vitamins-Minerals (CENTRUM SILVER 50+WOMEN) TABS Take 1 tablet by mouth daily.    . nitroGLYCERIN (NITROSTAT) 0.4 MG SL tablet Place 0.4 mg under the tongue every 5 (five) minutes x 3 doses as needed for chest pain.     . RABEprazole (ACIPHEX) 20 MG tablet Take 1 tablet (20 mg total) by mouth daily. 30 tablet 2  . rosuvastatin (CRESTOR) 20 MG tablet Take 20 mg by mouth at bedtime.    . triamcinolone cream (KENALOG) 0.1 % Apply 1 application topically See admin instructions. TO AFFECTED AREA(S) DAILY AS DIRECTED     No current facility-administered medications for this visit.     ASSESSMENT & PLAN:   Assessment:   1. History of stage IIIB colon cancer diagnosed in December 2012.  The patient remains without evidence of recurrence.      3. Depression/Anxiety. This is improving as she is more active. She enjoys caring for her animals.   4. 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  She underwent surgery with Dr. Sherlyn Lick on March 9th/10th 2020, but notes no improvement.  Plan: Her CEA has risen, but CT imaging remains negative for malignancy. CEA is pending for today. We discussed that we will continue to monitor this. I reviewed her CBC and CMP today in detail with her. We will see her back in 3 months for repeat evaluation.   She verbalizes understanding of and agreement to the plans discussed today. She knows to call  the office should any new questions or concerns arise.    Melodye Ped, NP [facility] AT [facility] [address] [place] [address] Dept: [phone] Dept Fax: [phone]

## 2020-10-30 NOTE — Addendum Note (Signed)
Addended by: Belva Chimes on: 10/30/2020 01:24 PM   Modules accepted: Orders

## 2020-10-30 NOTE — Telephone Encounter (Signed)
Per 5/19 los next appt scheduled and given to patient 

## 2020-10-31 ENCOUNTER — Encounter: Payer: Self-pay | Admitting: Gastroenterology

## 2020-10-31 ENCOUNTER — Ambulatory Visit (INDEPENDENT_AMBULATORY_CARE_PROVIDER_SITE_OTHER): Payer: Medicare HMO | Admitting: Gastroenterology

## 2020-10-31 VITALS — BP 112/72 | HR 65 | Ht 60.0 in | Wt 167.0 lb

## 2020-10-31 DIAGNOSIS — K219 Gastro-esophageal reflux disease without esophagitis: Secondary | ICD-10-CM | POA: Diagnosis not present

## 2020-10-31 DIAGNOSIS — Z860100 Personal history of colon polyps, unspecified: Secondary | ICD-10-CM

## 2020-10-31 DIAGNOSIS — K449 Diaphragmatic hernia without obstruction or gangrene: Secondary | ICD-10-CM

## 2020-10-31 DIAGNOSIS — R490 Dysphonia: Secondary | ICD-10-CM

## 2020-10-31 DIAGNOSIS — R1310 Dysphagia, unspecified: Secondary | ICD-10-CM

## 2020-10-31 LAB — CEA: CEA: 5.6 ng/mL — ABNORMAL HIGH (ref 0.0–4.7)

## 2020-10-31 MED ORDER — DEXLANSOPRAZOLE 60 MG PO CPDR: 1.0000 | DELAYED_RELEASE_CAPSULE | Freq: Every day | ORAL | 4 refills | Status: DC

## 2020-10-31 MED ORDER — FAMOTIDINE 20 MG PO TABS: 20.0000 mg | ORAL_TABLET | Freq: Every day | ORAL | 4 refills | Status: DC

## 2020-10-31 NOTE — Patient Instructions (Signed)
If you are age 67 or older, your body mass index should be between 23-30. Your Body mass index is 32.61 kg/m. If this is out of the aforementioned range listed, please consider follow up with your Primary Care Provider.  If you are age 22 or younger, your body mass index should be between 19-25. Your Body mass index is 32.61 kg/m. If this is out of the aformentioned range listed, please consider follow up with your Primary Care Provider.   You have been scheduled for a Barium Esophogram at [facility] (1st floor of the hospital) on June 7th at 10:15am. Please arrive 15 minutes prior to your appointment for registration. Make certain not to have anything to eat or drink 3 hours prior to your test. If you need to reschedule for any reason, please contact radiology at [phone] to do so. __________________________________________________________________ A barium swallow is an examination that concentrates on views of the esophagus. This tends to be a double contrast exam (barium and two liquids which, when combined, create a gas to distend the wall of the oesophagus) or single contrast (non-ionic iodine based). The study is usually tailored to your symptoms so a good history is essential. Attention is paid during the study to the form, structure and configuration of the esophagus, looking for functional disorders (such as aspiration, dysphagia, achalasia, motility and reflux) EXAMINATION You may be asked to change into a gown, depending on the type of swallow being performed. A radiologist and radiographer will perform the procedure. The radiologist will advise you of the type of contrast selected for your procedure and direct you during the exam. You will be asked to stand, sit or lie in several different positions and to hold a small amount of fluid in your mouth before being asked to swallow while the imaging is performed .In some instances you may be asked to swallow barium coated  marshmallows to assess the motility of a solid food bolus. The exam can be recorded as a digital or video fluoroscopy procedure. POST PROCEDURE It will take 1-2 days for the barium to pass through your system. To facilitate this, it is important, unless otherwise directed, to increase your fluids for the next 24-48hrs and to resume your normal diet.  This test typically takes about 30 minutes to perform. __________________________________________________________________________________  We have sent the following medications to your pharmacy for you to pick up at your convenience: [place] have been scheduled for an endoscopy. Please follow written instructions given to you at your visit today. If you use inhalers (even only as needed), please bring them with you on the day of your procedure.  Thank you,  Dr. Jackquline Denmark

## 2020-10-31 NOTE — Progress Notes (Signed)
Chief Complaint: FU  Referring Provider:  Charlynn Court, NP      ASSESSMENT AND PLAN;   #1. GERD with large HH with LPR/hoarsenss. Now with eso dysphagia. H/O Schatzki's ring s/p eso dil 2018.  #2.  H/O colon cancer (stage III, [id number]) s/p R hemicolectomy 2012.  H/O colonic polyps 03/2015.  Neg colon 05/2020.  Being followed by oncology.  Plan: - Ba swallow with barium tablet. - EGD with esophageal dilatation. - Dexilant 60mg  po qd #90, 4 refiils. - Pepcid 20mg  po qhs. #90, 4 refills. - Instructed patient to chew foods especially meats and breads well and eat slowly.  HPI:    [name] is a 65 y.o. female  For follow-up  Occ Dysphagia x 1 to 2 months, mid chest, mostly with solids.  No associated heartburn as long as she takes Dexilant in the morning and Pepcid at night.  No weight loss.  No melena or hematochezia.  She does get hoarse and has been evaluated by Dr. Laurance Flatten, ENT.  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   Seen by Lucita Lora, recommended GI evaluation.  No significant diarrhea or constipation.   Past GI procedures: -EGD 05/26/2017 8 cm HH with small paraesophageal component, Schatzki ring s/p dil 52 Fr -Colonoscopy 04/09/2015 ([facility])-6 mm sessile polyp s/p polypectomy (Bx- TA), R hemi, mild pancolonic div. 05/14/2020: Pancolonic diverticulosis.  Repeat in 5 years. -CT chest Abdo/pelvis with p.o. and IV contrast 07/2020: No recurrence, moderate to large hiatal hernia, atherosclerosis. Past Medical History:  Diagnosis Date  . Adjustment disorder   . Anesthesia complication 3/53/6144   Overview:  "It takes me a while to wake up."  Formatting of this note might be different from the original. "It takes me a while to wake up."  . Anxiety 01/09/2018  . Anxiety disorder   . Arthritis    hands, back  . Atypical chest pain 12/19/2016  . Borderline diabetes 01/09/2018  . CAD (coronary artery  disease)   . Cancer ([place]) 01/09/2018   Overview:  colon  Formatting of this note might be different from the original. colon  . Cataract    removed by surgery  . Chest pain   . Colon cancer ([place]) 2012  . Depression   . Depression 01/09/2018  . Dysphonia   . Dysthymic disorder   . Fatty liver 01/09/2018  . 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 Gastroesophageal reflux disease   . Hiatal hernia with GERD 01/09/2018  . History of kidney stones 01/09/2018  . Hyperlipidemia   . Insomnia   . Kidney stones    removed by surgery  . MRSA (methicillin resistant Staphylococcus aureus)   . MRSA infection 01/09/2018   Overview:  states winter of 2016, treated at [facility] by Lucita Lora NP  Formatting of this note might be different from the original. states winter of 2016, treated at [facility] by Lucita Lora NP  . Neuropathy   . Obesity   . Palpitations 01/20/2017  . Pneumonia   . Pre-syncope   . Sleep apnea     Past Surgical History:  Procedure Laterality Date  . ABDOMINAL HYSTERECTOMY    . BACK SURGERY    . BLADDER SURGERY    . CATARACT EXTRACTION, BILATERAL    . COLON SURGERY  2012   Due to colon cancer stage 3  . COLONOSCOPY  04/09/2015   Colonic polyp status post polypectomy.  Mild pancolonic diverticulosis/ History of colon cancer status post right hemicolectomy. No evidence of recurrence   . ESOPHAGOGASTRODUODENOSCOPY  05/26/2017   Schatzkis ring statust post esophageal dilatation. Hiatal hernia.   Marland Kitchen PORT-A-CATH REMOVAL    . removal kidney stones    . TUBAL LIGATION      Family History  Problem Relation Age of Onset  . CAD Mother        Age of Onset 56  . Hypertension Mother   . Kidney disease Mother   . CVA Brother   . Heart failure Father   . COPD Father   . Colon cancer Neg Hx   . Rectal cancer Neg Hx   . Stomach cancer Neg Hx     Social History   Tobacco Use  . Smoking status: Never Smoker  . Smokeless tobacco: Never Used  Vaping Use  . Vaping  Use: Never used  Substance Use Topics  . Alcohol use: No  . Drug use: No    Current Outpatient Medications  Medication Sig Dispense Refill  . aspirin EC 81 MG tablet Take 81 mg by mouth daily.    . B Complex-Folic Acid (B COMPLEX-VITAMIN B12 PO) Take by mouth.    Marland Kitchen CALCIUM PO Take 2 tablets by mouth daily.     . Cholecalciferol (VITAMIN D3) 2000 units TABS Take 2,000 Units by mouth daily.    Marland Kitchen CINNAMON PO Take by mouth daily.    Marland Kitchen dexlansoprazole (DEXILANT) 60 MG capsule Take 1 capsule (60 mg total) by mouth daily. 90 capsule 2  . DULoxetine (CYMBALTA) 60 MG capsule Take 60 mg by mouth daily.  1  . famotidine (PEPCID) 20 MG tablet Take 1 tablet (20 mg total) by mouth daily. 90 tablet 2  . gabapentin (NEURONTIN) 800 MG tablet Take by mouth.    . meloxicam (MOBIC) 15 MG tablet Take 15 mg by mouth daily.    . metoprolol succinate (TOPROL XL) 25 MG 24 hr tablet Take 1 tablet (25 mg total) by mouth 2 (two) times daily. 60 tablet 11  . Multiple Vitamins-Minerals (CENTRUM SILVER 50+WOMEN) TABS Take 1 tablet by mouth daily.    . nitroGLYCERIN (NITROSTAT) 0.4 MG SL tablet Place 0.4 mg under the tongue every 5 (five) minutes x 3 doses as needed for chest pain.     . rosuvastatin (CRESTOR) 20 MG tablet Take 20 mg by mouth at bedtime.     No current facility-administered medications for this visit.    Allergies  Allergen Reactions  . Penicillins Rash    Has patient had a PCN reaction causing immediate rash, facial/tongue/throat swelling, SOB or lightheadedness with hypotension: Yes Has patient had a PCN reaction causing severe rash involving mucus membranes or skin necrosis: No Has patient had a PCN reaction that required hospitalization: No Has patient had a PCN reaction occurring within the last 10 years: No If all of the above answers are "NO", then may proceed with Cephalosporin use.   . Codeine Other (See Comments)    Dizziness and lethargy   . Aciphex [Rabeprazole] Rash  .  Guaifenesin & Derivatives Rash  . Latex Rash    Usually from adhesive tapes  . Other Rash    Tape cannot be on the skin for an extended period of time  . Pseudoephedrine Rash  . Sulfa Antibiotics Rash  . Sulfasalazine Rash  . Tape Rash    Cannot be on the skin for an extended period of time    Review  of Systems:  neg     Physical Exam:    BP 112/72 (BP Location: Right Arm, Patient Position: Sitting, Cuff Size: Normal)   Pulse 65   Ht 5' (1.524 m)   Wt 167 lb (75.8 kg)   LMP  (LMP Unknown)   BMI 32.61 kg/m  Filed Weights   10/31/20 1401  Weight: 167 lb (75.8 kg)  Gen: awake, alert, NAD HEENT: anicteric, no pallor CV: RRR, no mrg Pulm: CTA b/l Abd: soft, NT/ND, +BS throughout Ext: no c/c/e Neuro: nonfocal       Carmell Austria, MD 10/31/2020, 2:28 PM  Cc: Charlynn Court, NP

## 2020-11-03 ENCOUNTER — Encounter: Payer: Self-pay | Admitting: Gastroenterology

## 2020-11-04 ENCOUNTER — Telehealth: Payer: Self-pay

## 2020-11-04 NOTE — Telephone Encounter (Addendum)
I called pt with below results. She also asked for a copy to be left @ front desk.   ----- Message from Melodye Ped, NP sent at 11/04/2020  3:25 PM EDT ----- Regarding: RE: Cancer marker Contact: [phone] It is 5.6 which is better.  ----- Message ----- From: Dairl Ponder, RN Sent: 11/04/2020   3:04 PM EDT To: Melodye Ped, NP Subject: Cancer marker                                  Pt called to get results of the tumor marker.

## 2020-11-18 ENCOUNTER — Other Ambulatory Visit: Payer: Self-pay

## 2020-11-18 ENCOUNTER — Ambulatory Visit (HOSPITAL_COMMUNITY)
Admission: RE | Admit: 2020-11-18 | Discharge: 2020-11-18 | Disposition: A | Discharge status: Home / Self Care | Payer: Medicare HMO | Source: Ambulatory Visit | Attending: Gastroenterology | Admitting: Gastroenterology

## 2020-11-18 DIAGNOSIS — K449 Diaphragmatic hernia without obstruction or gangrene: Secondary | ICD-10-CM | POA: Diagnosis present

## 2020-11-18 DIAGNOSIS — Z860100 Personal history of colon polyps, unspecified: Secondary | ICD-10-CM

## 2020-11-18 DIAGNOSIS — R490 Dysphonia: Secondary | ICD-10-CM | POA: Insufficient documentation

## 2020-11-18 DIAGNOSIS — R1310 Dysphagia, unspecified: Secondary | ICD-10-CM | POA: Diagnosis present

## 2020-11-18 DIAGNOSIS — K219 Gastro-esophageal reflux disease without esophagitis: Secondary | ICD-10-CM | POA: Diagnosis present

## 2020-12-01 ENCOUNTER — Telehealth: Payer: Self-pay | Admitting: Gastroenterology

## 2020-12-01 NOTE — Telephone Encounter (Signed)
Inbound call from patient, returning call about labs. [phone]

## 2020-12-01 NOTE — Telephone Encounter (Signed)
Gave results of barium swallow and patient said she has her instructions for tomorrow

## 2020-12-02 ENCOUNTER — Other Ambulatory Visit: Payer: Self-pay

## 2020-12-02 ENCOUNTER — Ambulatory Visit (AMBULATORY_SURGERY_CENTER): Payer: Medicare HMO | Admitting: Gastroenterology

## 2020-12-02 ENCOUNTER — Encounter: Payer: Self-pay | Admitting: Gastroenterology

## 2020-12-02 VITALS — BP 119/76 | HR 62 | Temp 97.8°F | Resp 19 | Ht 60.0 in | Wt 167.0 lb

## 2020-12-02 DIAGNOSIS — K2970 Gastritis, unspecified, without bleeding: Secondary | ICD-10-CM

## 2020-12-02 DIAGNOSIS — K222 Esophageal obstruction: Secondary | ICD-10-CM | POA: Diagnosis not present

## 2020-12-02 DIAGNOSIS — K219 Gastro-esophageal reflux disease without esophagitis: Secondary | ICD-10-CM | POA: Diagnosis present

## 2020-12-02 DIAGNOSIS — R1310 Dysphagia, unspecified: Secondary | ICD-10-CM

## 2020-12-02 DIAGNOSIS — K3189 Other diseases of stomach and duodenum: Secondary | ICD-10-CM | POA: Diagnosis not present

## 2020-12-02 DIAGNOSIS — K259 Gastric ulcer, unspecified as acute or chronic, without hemorrhage or perforation: Secondary | ICD-10-CM | POA: Diagnosis not present

## 2020-12-02 DIAGNOSIS — K449 Diaphragmatic hernia without obstruction or gangrene: Secondary | ICD-10-CM | POA: Diagnosis not present

## 2020-12-02 DIAGNOSIS — K2289 Other specified disease of esophagus: Secondary | ICD-10-CM | POA: Diagnosis not present

## 2020-12-02 MED ORDER — SODIUM CHLORIDE 0.9 % IV SOLN: 500.0000 mL | Freq: Once | INTRAVENOUS | Status: DC

## 2020-12-02 MED ORDER — FAMOTIDINE 40 MG PO TABS: 40.0000 mg | ORAL_TABLET | Freq: Every day | ORAL | 2 refills | Status: DC

## 2020-12-02 NOTE — Progress Notes (Signed)
A and O x3. Report to RN. Tolerated MAC anesthesia well.Teeth unchanged after procedure. 

## 2020-12-02 NOTE — Op Note (Addendum)
[facility] Patient Name: [name] Procedure Date: 12/02/2020 10:01 AM MRN: [id number] Endoscopist: Jackquline Denmark , MD Age: 65 Referring MD:  Date of Birth: 09-21-1955 Gender: Female Account #: [id number] Procedure:                Upper GI endoscopy Indications:              Dysphagia with barium swallow showing large hiatal                            hernia. Medicines:                Monitored Anesthesia Care Procedure:                Pre-Anesthesia Assessment:                           - Prior to the procedure, a History and Physical                            was performed, and patient medications and                            allergies were reviewed. The patient's tolerance of                            previous anesthesia was also reviewed. The risks                            and benefits of the procedure and the sedation                            options and risks were discussed with the patient.                            All questions were answered, and informed consent                            was obtained. Prior Anticoagulants: The patient has                            taken no previous anticoagulant or antiplatelet                            agents. ASA Grade Assessment: II - A patient with                            mild systemic disease. After reviewing the risks                            and benefits, the patient was deemed in                            satisfactory condition to undergo the procedure.  After obtaining informed consent, the endoscope was                            passed under direct vision. Throughout the                            procedure, the patient's blood pressure, pulse, and                            oxygen saturations were monitored continuously. The                            Endoscope was introduced through the mouth, and                            advanced to the second part of duodenum. The  upper                            GI endoscopy was accomplished without difficulty.                            The patient tolerated the procedure well. Scope In: Scope Out: Findings:                 The esophagus was tortuous and foreshortened d/t                            HH. A moderate muscular Schatzki ring was found at                            the gastroesophageal junction, 32 cm from the                            incisors with luminal diameter of 12 mm. Biopsies                            were obtained from the proximal and distal                            esophagus with cold forceps for histology. The                            scope was withdrawn. Dilation was performed with a                            Maloney dilator with mild resistance at 10 Fr and                            54 Fr.                           An 8 cm hiatal hernia was present with Lysbeth Galas  erosions from 32 up to 40 cm. There was mild antral                            gastritis in form of erythema. Biopsies were taken                            with a cold forceps for histology from throughout                            the stomach.                           The examined duodenum was normal. Complications:            No immediate complications. Estimated Blood Loss:     Estimated blood loss: none. Estimated blood loss:                            none. Impression:               - Schatzki ring. Biopsied. Dilated                           - 8 cm hiatal hernia with Cameron erosions.                            Biopsied.                           - Normal examined duodenum. Recommendation:           - Patient has a contact number available for                            emergencies. The signs and symptoms of potential                            delayed complications were discussed with the                            patient. Return to normal activities tomorrow.                             Written discharge instructions were provided to the                            patient.                           - Post dilatation diet.                           - Avoid nonsteroidals.                           - Continue present medications including Dexilant  60 mg p.o daily. Add Pepcid 40 mg p.o. nightly #90,                            2 refills.                           - Await pathology results.                           - The findings and recommendations were discussed                            with the patient's family.                           - FU PRN. Jackquline Denmark, MD 12/02/2020 10:26:04 AM This report has been signed electronically.

## 2020-12-02 NOTE — Progress Notes (Signed)
Called to room to assist during endoscopic procedure.  Patient ID and intended procedure confirmed with present staff. Received instructions for my participation in the procedure from the performing physician.  

## 2020-12-02 NOTE — Patient Instructions (Signed)
YOU HAD AN ENDOSCOPIC PROCEDURE TODAY AT [facility]:   Refer to the procedure report that was given to you for any specific questions about what was found during the examination.  If the procedure report does not answer your questions, please call your gastroenterologist to clarify.  If you requested that your care partner not be given the details of your procedure findings, then the procedure report has been included in a sealed envelope for you to review at your convenience later.  YOU SHOULD EXPECT: Some feelings of bloating in the abdomen. Passage of more gas than usual.  Walking can help get rid of the air that was put into your GI tract during the procedure and reduce the bloating. If you had a lower endoscopy (such as a colonoscopy or flexible sigmoidoscopy) you may notice spotting of blood in your stool or on the toilet paper. If you underwent a bowel prep for your procedure, you may not have a normal bowel movement for a few days.  Please Note:  You might notice some irritation and congestion in your nose or some drainage.  This is from the oxygen used during your procedure.  There is no need for concern and it should clear up in a day or so.  SYMPTOMS TO REPORT IMMEDIATELY:   Following upper endoscopy (EGD)  Vomiting of blood or coffee ground material  New chest pain or pain under the shoulder blades  Painful or persistently difficult swallowing  New shortness of breath  Fever of 100F or higher  Black, tarry-looking stools  For urgent or emergent issues, a gastroenterologist can be reached at any hour by calling [phone]. Do not use MyChart messaging for urgent concerns.    DIET:  Follow a Post-Dilation diet (see handout given to you by your recovery nurse): Nothing by mouth until 11:30 am, then Clear Liquids only from 11:30 am to 12:30 pm, then you may proceed to a Soft Diet from 12:30 pm for the rest of the day today. You may proceed to your regular diet  tomorrow am as tolerated.   Drink plenty of fluids but you should avoid alcoholic beverages for 24 hours.  MEDICATIONS: Continue present medications including Dexilant 60 mg by mouth daily. Add Pepcid 40 mg by mouth nightly (#90, 2 refills). Avoid Ibuprofen, Alleve, Naproxen, and all non-steroidal anti-inflammatory drugs.  Follow up with Dr. Lyndel Safe as needed in his office.  Please see handouts given to you by your recovery nurse.  Thank you for allowing [place] to provide for your healthcare needs today.  ACTIVITY:  You should plan to take it easy for the rest of today and you should NOT DRIVE or use heavy machinery until tomorrow (because of the sedation medicines used during the test).    FOLLOW UP: Our staff will call the number listed on your records 48-72 hours following your procedure to check on you and address any questions or concerns that you may have regarding the information given to you following your procedure. If we do not reach you, we will leave a message.  We will attempt to reach you two times.  During this call, we will ask if you have developed any symptoms of COVID 19. If you develop any symptoms (ie: fever, flu-like symptoms, shortness of breath, cough etc.) before then, please call [phone].  If you test positive for Covid 19 in the 2 weeks post procedure, please call and report this information to [place].    If any biopsies  were taken you will be contacted by phone or by letter within the next 1-3 weeks.  Please call us at [phone] if you have not heard about the biopsies in 3 weeks.    SIGNATURES/CONFIDENTIALITY: You and/or your care partner have signed paperwork which will be entered into your electronic medical record.  These signatures attest to the fact that that the information above on your After Visit Summary has been reviewed and is understood.  Full responsibility of the confidentiality of this discharge information lies with you and/or your care-partner.

## 2020-12-04 ENCOUNTER — Telehealth: Payer: Self-pay | Admitting: *Deleted

## 2020-12-04 NOTE — Telephone Encounter (Signed)
  Follow up Call-  Call back number 12/02/2020 05/14/2020  Post procedure Call Back phone  # [phone]  Permission to leave phone message Yes Yes  Some recent data might be hidden     Patient questions:  Do you have a fever, pain , or abdominal swelling? No. Pain Score  0 *  Have you tolerated food without any problems? Yes.    Have you been able to return to your normal activities? Yes.    Do you have any questions about your discharge instructions: Diet   No. Medications  Yes.   Follow up visit  No.  Do you have questions or concerns about your Care? No.  Actions: * If pain score is 4 or above: No action needed, pain <4.   Pt. Had question about dosage of medication and was able to clear question up according to report.   Have you developed a fever since your procedure? no  2.   Have you had an respiratory symptoms (SOB or cough) since your procedure? no  3.   Have you tested positive for COVID 19 since your procedure no  4.   Have you had any family members/close contacts diagnosed with the COVID 19 since your procedure?  no   If yes to any of these questions please route to Joylene John, RN and Joella Prince, RN

## 2020-12-08 ENCOUNTER — Encounter: Payer: Self-pay | Admitting: Gastroenterology

## 2021-01-30 ENCOUNTER — Inpatient Hospital Stay: Payer: Medicare HMO

## 2021-01-30 ENCOUNTER — Inpatient Hospital Stay: Payer: Medicare HMO | Admitting: Oncology

## 2021-01-30 DIAGNOSIS — C182 Malignant neoplasm of ascending colon: Secondary | ICD-10-CM

## 2021-02-02 ENCOUNTER — Inpatient Hospital Stay (INDEPENDENT_AMBULATORY_CARE_PROVIDER_SITE_OTHER): Payer: Medicare HMO | Admitting: Hematology and Oncology

## 2021-02-02 ENCOUNTER — Telehealth: Payer: Self-pay | Admitting: Hematology and Oncology

## 2021-02-02 ENCOUNTER — Encounter: Payer: Self-pay | Admitting: Hematology and Oncology

## 2021-02-02 ENCOUNTER — Inpatient Hospital Stay: Payer: Medicare HMO | Attending: Oncology

## 2021-02-02 ENCOUNTER — Other Ambulatory Visit: Payer: Self-pay | Admitting: Hematology and Oncology

## 2021-02-02 VITALS — BP 136/85 | HR 52 | Temp 98.0°F | Resp 16 | Ht 60.0 in | Wt 165.8 lb

## 2021-02-02 DIAGNOSIS — C182 Malignant neoplasm of ascending colon: Secondary | ICD-10-CM | POA: Diagnosis not present

## 2021-02-02 DIAGNOSIS — R970 Elevated carcinoembryonic antigen [CEA]: Secondary | ICD-10-CM | POA: Insufficient documentation

## 2021-02-02 DIAGNOSIS — Z9049 Acquired absence of other specified parts of digestive tract: Secondary | ICD-10-CM | POA: Diagnosis not present

## 2021-02-02 DIAGNOSIS — Z7982 Long term (current) use of aspirin: Secondary | ICD-10-CM | POA: Insufficient documentation

## 2021-02-02 DIAGNOSIS — R0789 Other chest pain: Secondary | ICD-10-CM | POA: Diagnosis not present

## 2021-02-02 DIAGNOSIS — F419 Anxiety disorder, unspecified: Secondary | ICD-10-CM | POA: Insufficient documentation

## 2021-02-02 DIAGNOSIS — Z85038 Personal history of other malignant neoplasm of large intestine: Secondary | ICD-10-CM | POA: Diagnosis present

## 2021-02-02 DIAGNOSIS — C184 Malignant neoplasm of transverse colon: Secondary | ICD-10-CM

## 2021-02-02 DIAGNOSIS — R10812 Left upper quadrant abdominal tenderness: Secondary | ICD-10-CM

## 2021-02-02 DIAGNOSIS — R001 Bradycardia, unspecified: Secondary | ICD-10-CM | POA: Insufficient documentation

## 2021-02-02 DIAGNOSIS — F32A Depression, unspecified: Secondary | ICD-10-CM | POA: Insufficient documentation

## 2021-02-02 DIAGNOSIS — R0609 Other forms of dyspnea: Secondary | ICD-10-CM | POA: Diagnosis not present

## 2021-02-02 DIAGNOSIS — M47816 Spondylosis without myelopathy or radiculopathy, lumbar region: Secondary | ICD-10-CM | POA: Insufficient documentation

## 2021-02-02 DIAGNOSIS — Z79899 Other long term (current) drug therapy: Secondary | ICD-10-CM | POA: Insufficient documentation

## 2021-02-02 DIAGNOSIS — R1012 Left upper quadrant pain: Secondary | ICD-10-CM | POA: Diagnosis not present

## 2021-02-02 LAB — HEPATIC FUNCTION PANEL
ALT: 19 (ref 7–35)
AST: 34 (ref 13–35)
Alkaline Phosphatase: 79 (ref 25–125)
Bilirubin, Total: 0.6

## 2021-02-02 LAB — COMPREHENSIVE METABOLIC PANEL
Albumin: 4 (ref 3.5–5.0)
Calcium: 9.4 (ref 8.7–10.7)

## 2021-02-02 LAB — BASIC METABOLIC PANEL
BUN: 13 (ref 4–21)
CO2: 22 (ref 13–22)
Chloride: 106 (ref 99–108)
Creatinine: 1 (ref 0.5–1.1)
Glucose: 103
Potassium: 4.3 (ref 3.4–5.3)
Sodium: 137 (ref 137–147)

## 2021-02-02 LAB — CBC AND DIFFERENTIAL
HCT: 40 (ref 36–46)
Hemoglobin: 13.2 (ref 12.0–16.0)
Neutrophils Absolute: 3.42
Platelets: 134 — AB (ref 150–399)
WBC: 5.8

## 2021-02-02 LAB — CBC: RBC: 4.44 (ref 3.87–5.11)

## 2021-02-02 NOTE — Telephone Encounter (Signed)
Per 8/22 LOS, patient scheduled for Nov Follow Up.  Will send order to Scheduling for Labs, CT Scans for NOV

## 2021-02-02 NOTE — Progress Notes (Signed)
[facility]  [address] [place],  [place]  [address] [phone]  Clinic Day:  02/03/2021  Referring physician: Charlynn Court, NP   CHIEF COMPLAINT:  CC:  History of stage IIIB colon cancer with mild elevation of the CEA  Current Treatment:   Observation   HISTORY OF PRESENT ILLNESS:  [name] is a 65 y.o. 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  She was treated with surgical resection.  Pathology revealed a 3.6 centimeter, grade 2, adenocarcinoma with 3 of 45 nodes positive for metastasis.  She received adjuvant chemotherapy with FOLFOX for 5 cycles.  Oxaliplatin was then discontinued because of severe cytopenias.  She continued 5 fluorouracil and leucovorin for 4 more cycles, but then developed a severe allergic reaction to the 5-FU.  She was then placed on Xeloda for 6 weeks to complete a full 6 months of adjuvant chemotherapy.  She also had difficulty tolerating the Xeloda.  She has not had evidence of recurrence.  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  The patient is a nonsmoker.  She had a CT abdomen/pelvis in June 2016 for abdominal pain, but this was negative other than a small hiatal hernia.  MRI of the lumbar spine in July 2019 revealed left sided neural impingement at L1-2 with multilevel spondylosis. She had spinal surgery with Dr. Sherlyn Lick.  Her CEA was mildly elevated in April 2021 at 5.5. CT chest, abdomen and pelvis at that time did not reveal any evidence of malignancy. The CEA was 5 in July, then 5.2 in October. Bilateral mammogram from September 2021 was clear.  Colonoscopy with Dr. Lyndel Safe in December was unremarkable. Repeat in 5 years was recommended.   The CEA went up to 7.0 in February. CT chest,  abdomen and pelvis from February did not reveal any evidence of local recurrence of metastatic disease. There was a stable moderate to large hiatal hernia noted.  The CEA was down to 5.6 in May.  INTERVAL HISTORY:  [name] is here today for repeat clinical assessment and states she has been doing fairly well. However, on further questioning, she reports multiple episodes of dull left chest pain lasting about 20 minutes over the past several months, for which she has taken 3-'81mg'$  baby aspirin each time. She denies palpitations. She denies lower extremity edema. She reports chronic dyspnea with exertion, which she attributes to weight gain. She has not reported her symptoms to her cardiologist, Dr. Agustin Cree. She states she has 40% coronary artery blockage, for which she is on aspirin 81 mg daily, metoprolol 25 mg twice daily and rosuvastatin 20 mg daily. She also has nitroglycerin, but does not use this as a gives her a headache. She reports feeling depressed earlier this year when the 67 year old gentleman she sat with passed away. Also, the anniversary of her husband's death is this month. She reports insomnia with difficulty with both falling asleep and staying asleep.  She denies suicidal ideation. She states she is not sure if she continues duloxetine or not. She denies fevers or chills. She denies pain. Her appetite is good. Her weight has been stable.  She states she saw Dr. Agustin Cree earlier this year. She has not had a recent visit with her primary care  provider, but states she will see her soon.  REVIEW OF SYSTEMS:  Review of Systems  Constitutional:  Negative for appetite change, chills, fatigue, fever and unexpected weight change.  HENT:   Negative for lump/mass, mouth sores and sore throat.   Respiratory:  Positive for shortness of breath (with exertion). Negative for cough.   Cardiovascular:  Positive for chest pain (intermittent, dull, left chest pain). Negative for leg swelling and  palpitations.  Gastrointestinal:  Negative for abdominal pain, constipation, diarrhea, nausea and vomiting.  Endocrine: Negative for hot flashes.  Genitourinary:  Negative for difficulty urinating, dysuria, frequency and hematuria.   Musculoskeletal:  Negative for arthralgias, back pain and myalgias.  Skin:  Negative for rash.  Neurological:  Negative for dizziness and headaches.  Hematological:  Negative for adenopathy. Does not bruise/bleed easily.  Psychiatric/Behavioral:  Positive for sleep disturbance. Negative for depression and suicidal ideas. The patient is not nervous/anxious.     VITALS:  Blood pressure 136/85, pulse (!) 52, temperature 98 F (36.7 C), temperature source Oral, resp. rate 16, height 5' (1.524 m), weight 165 lb 12.8 oz (75.2 kg), SpO2 97 %.  Wt Readings from Last 3 Encounters:  02/02/21 165 lb 12.8 oz (75.2 kg)  12/02/20 167 lb (75.8 kg)  10/31/20 167 lb (75.8 kg)    Body mass index is 32.38 kg/m.  Performance status (ECOG): 1 - Symptomatic but completely ambulatory  PHYSICAL EXAM:  Physical Exam Vitals and nursing note reviewed.  Constitutional:      General: She is not in acute distress.    Appearance: Normal appearance.  HENT:     Head: Normocephalic and atraumatic.     Mouth/Throat:     Mouth: Mucous membranes are moist.     Pharynx: Oropharynx is clear. No oropharyngeal exudate or posterior oropharyngeal erythema.  Eyes:     General: No scleral icterus.    Extraocular Movements: Extraocular movements intact.     Conjunctiva/sclera: Conjunctivae normal.     Pupils: Pupils are equal, round, and reactive to light.  Cardiovascular:     Rate and Rhythm: Regular rhythm. Bradycardia present.     Heart sounds: Normal heart sounds. No murmur heard.   No friction rub. No gallop.  Pulmonary:     Effort: Pulmonary effort is normal.     Breath sounds: Normal breath sounds. No wheezing, rhonchi or rales.  Abdominal:     General: Bowel sounds are normal.  There is no distension.     Palpations: Abdomen is soft. There is no hepatomegaly, splenomegaly or mass.     Tenderness: abdominal tenderness in the left upper quadrant There is no rebound.  Musculoskeletal:        General: Normal range of motion.     Cervical back: Normal range of motion and neck supple. No tenderness.     Right lower leg: No edema.     Left lower leg: No edema.  Lymphadenopathy:     Cervical: No cervical adenopathy.     Upper Body:     Right upper body: No supraclavicular or axillary adenopathy.     Left upper body: No supraclavicular or axillary adenopathy.     Lower Body: No right inguinal adenopathy. No left inguinal adenopathy.  Skin:    General: Skin is warm and dry.     Coloration: Skin is not jaundiced.     Findings: No rash.  Neurological:     Mental Status: She is alert and oriented to person, place, and time.  Cranial Nerves: No cranial nerve deficit.  Psychiatric:        Attention and Perception: Attention normal.        Mood and Affect: Mood normal. Affect is blunt.        Speech: Speech normal.        Behavior: Behavior normal.        Thought Content: Thought content normal. Thought content does not include suicidal ideation.        Cognition and Memory: Cognition normal.        Judgment: Judgment normal.    LABS:   CBC Latest Ref Rng & Units 02/02/2021 10/30/2020 10/30/2020  WBC - 5.8 TEST REQUEST RECEIVED WITHOUT APPROPRIATE SPECIMEN 9.9  Hemoglobin 12.0 - 16.0 13.2 TEST REQUEST RECEIVED WITHOUT APPROPRIATE SPECIMEN 13.1  Hematocrit 36 - 46 40 TEST REQUEST RECEIVED WITHOUT APPROPRIATE SPECIMEN 40  Platelets 150 - 399 134(A) TEST REQUEST RECEIVED WITHOUT APPROPRIATE SPECIMEN 121(A)   CMP Latest Ref Rng & Units 02/02/2021 10/30/2020 12/20/2016  Glucose 65 - 99 mg/dL - - 108(H)  BUN 4 - '21 13 14 8  '$ Creatinine 0.5 - 1.1 1.0 1.0 0.89  Sodium 137 - 147 137 136(A) 138  Potassium 3.4 - 5.3 4.3 4.4 4.6  Chloride 99 - 108 106 105 105  CO2 13 - 22 22  27(A) 27  Calcium 8.7 - 10.7 9.4 9.0 9.0  Total Protein 6.5 - 8.1 g/dL - - -  Total Bilirubin 0.3 - 1.2 mg/dL - - -  Alkaline Phos 25 - 125 79 82 -  AST 13 - 35 34 30 -  ALT 7 - 35 19 19 -     Lab Results  Component Value Date   CEA1 5.2 (H) 02/02/2021   /  CEA  Date Value Ref Range Status  02/02/2021 5.2 (H) 0.0 - 4.7 ng/mL Final    Comment:    (NOTE)                             Nonsmokers          <3.9                             Smokers             <5.6 [employer] Electrochemiluminescence Immunoassay (ECLIA) Values obtained with different assay methods or kits cannot be used interchangeably.  Results cannot be interpreted as absolute evidence of the presence or absence of malignant disease. Performed At: [facility] [place], [place] [id number] Rush Farmer MD [id number]    No results found for: PSA1 No results found for: CAN199 No results found for: CAN125  No results found for: TOTALPROTELP, ALBUMINELP, A1GS, A2GS, BETS, BETA2SER, GAMS, MSPIKE, SPEI No results found for: TIBC, FERRITIN, IRONPCTSAT No results found for: LDH  STUDIES:  No results found.    HISTORY:   Past Medical History:  Diagnosis Date   Adjustment disorder    Anesthesia complication [id number]   Overview:  "It takes me a while to wake up."  Formatting of this note might be different from the original. "It takes me a while to wake up."   Anxiety 01/09/2018   Anxiety disorder    Arthritis    hands, back   Atypical chest pain 12/19/2016   Borderline diabetes 01/09/2018   CAD (coronary artery disease)    Cancer ([place]) 01/09/2018  Overview:  colon  Formatting of this note might be different from the original. 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 states winter of 2016, treated at [facility] by Lucita Lora NP   Neuropathy    Obesity    Palpitations 01/20/2017   Pneumonia    Pre-syncope    Sleep apnea     Past Surgical History:  Procedure Laterality Date   ABDOMINAL HYSTERECTOMY     BACK SURGERY     BLADDER SURGERY     CATARACT EXTRACTION, BILATERAL     COLON SURGERY  2012   Due to colon cancer stage 3   COLONOSCOPY  04/09/2015   Colonic polyp status post polypectomy. Mild pancolonic diverticulosis/ History of colon cancer status post right hemicolectomy. No evidence of recurrence    ESOPHAGOGASTRODUODENOSCOPY  05/26/2017   Schatzkis ring statust post esophageal dilatation. Hiatal hernia.    PORT-A-CATH REMOVAL     removal kidney stones     TUBAL LIGATION      Family History  Problem Relation Age of Onset   CAD Mother        Age of Onset 53   Hypertension Mother    Kidney disease Mother    CVA Brother    Heart failure Father    COPD Father    Colon cancer Neg Hx    Rectal cancer Neg Hx    Stomach cancer Neg Hx     Social History:  reports that she has never smoked. She has never used smokeless tobacco. She reports that she does not drink alcohol and does not use drugs.The patient is alone today.  Allergies:  Allergies  Allergen Reactions   Penicillins Rash    Has patient had a PCN reaction causing immediate rash, facial/tongue/throat swelling, SOB or lightheadedness with hypotension: Yes Has patient had a PCN reaction causing severe rash involving mucus membranes or skin necrosis: No Has patient had a PCN reaction that required  hospitalization: No Has patient had a PCN reaction occurring within the last 10 years: No If all of the above answers are "NO", then may proceed with Cephalosporin use.    Codeine Other (See Comments)    Dizziness and lethargy    Aciphex [Rabeprazole] Rash   Guaifenesin & Derivatives Rash   Latex Rash    Usually from adhesive tapes   Other Rash    Tape cannot be on the skin for an extended period of time   Pseudoephedrine Rash   Sulfa Antibiotics Rash   Sulfasalazine Rash   Tape Rash    Cannot be on the skin for an extended period of time    Current Medications: Current Outpatient Medications  Medication Sig Dispense Refill   aspirin EC 81 MG tablet Take 81 mg by mouth daily.  B Complex-Folic Acid (B COMPLEX-VITAMIN B12 PO) Take by mouth.     CALCIUM PO Take 2 tablets by mouth daily.      Cholecalciferol (VITAMIN D3) 2000 units TABS Take 2,000 Units by mouth daily.     CINNAMON PO Take by mouth daily.     dexlansoprazole (DEXILANT) 60 MG capsule Take 1 capsule (60 mg total) by mouth daily. 90 capsule 4   DULoxetine (CYMBALTA) 60 MG capsule Take 60 mg by mouth daily.  1   famotidine (PEPCID) 40 MG tablet Take 1 tablet (40 mg total) by mouth at bedtime. 90 tablet 2   gabapentin (NEURONTIN) 800 MG tablet Take by mouth.     meloxicam (MOBIC) 15 MG tablet Take 15 mg by mouth daily.     metoprolol succinate (TOPROL XL) 25 MG 24 hr tablet Take 1 tablet (25 mg total) by mouth 2 (two) times daily. 60 tablet 11   Multiple Vitamins-Minerals (CENTRUM SILVER 50+WOMEN) TABS Take 1 tablet by mouth daily.     nitroGLYCERIN (NITROSTAT) 0.4 MG SL tablet Place 0.4 mg under the tongue every 5 (five) minutes x 3 doses as needed for chest pain.      rosuvastatin (CRESTOR) 20 MG tablet Take 20 mg by mouth at bedtime.     Current Facility-Administered Medications  Medication Dose Route Frequency Provider Last Rate Last Admin   0.9 %  sodium chloride infusion  500 mL Intravenous Once Jackquline Denmark,  MD         ASSESSMENT & PLAN:   Assessment:    1. History of stage IIIB colon cancer diagnosed in December 2012.  The patient remains without obvious evidence of recurrence.  Colonoscopy in December 2021 was unremarkable.  2. Mild elevation of the CEA of uncertain etiology.  CT imaging in February did not reveal any evidence of malignancy.  CEA remains mildly elevated, but in stable range.   3. Depression/Anxiety. I asked her to see her primary care provider to discuss the effectiveness of duloxetine.   4. 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 Sherlyn Lick.  5. New left upper quadrant tenderness, I therefore recommend repeating a CT abdomen and pelvis.  6. New atypical left chest pain and dyspnea with exertion.  I would recommend CT chest at this time as well, especially given the elevated CEA. I also recommended that she follow-up with Dr. Agustin Cree as soon as possible. If she has further episodes of chest pain, I recommended she be evaluated in the emergency room.  7. Bradycardia, likely due to metoprolol.  Due to her report of chest pain, an EKG was obtained and revealed sinus bradycardia, but was otherwise normal.  Plan:  As above, I will schedule her for CT chest, abdomen and pelvis for further evaluation of her new symptoms.  We will plan to contact her with the results. I recommended she follow-up with Dr. Agustin Cree regarding her chest pain.  I recommended she follow-up with Lucita Lora, NP, her primary care provider, regarding difficulty sleeping, which may be due to depression not controlled with current medication.  We will otherwise see her back in 3 months with a CBC, comprehensive metabolic panel and CEA for continued close follow-up due to the elevated CEA.  The patient understands the plans discussed today and is in agreement with them.  She knows to contact our office if she develops concerns prior to her next  appointment.    Marvia Pickles, PA-C

## 2021-02-03 ENCOUNTER — Telehealth: Payer: Self-pay | Admitting: Cardiology

## 2021-02-03 LAB — CEA: CEA: 5.2 ng/mL — ABNORMAL HIGH (ref 0.0–4.7)

## 2021-02-03 NOTE — Telephone Encounter (Signed)
Attempted to call pt twice. The phone gives a busy signal.

## 2021-02-03 NOTE — Telephone Encounter (Signed)
Pt c/o of Chest Pain: STAT if CP now or developed within 24 hours  1. Are you having CP right now? No   2. Are you experiencing any other symptoms (ex. SOB, nausea, vomiting, sweating)? A lil SOB  3. How long have you been experiencing CP? Close to a month  4. Is your CP continuous or coming and going? Coming and going  5. Have you taken Nitroglycerin? No; give patient really bad headache ?

## 2021-02-03 NOTE — Telephone Encounter (Signed)
Pt states that her HR was low yesterday at the [facility] and they requested pt to FU. Pt states she has intermittent chest pain as well. Pt advised to call 911 or go to the ED for chest pain, chest pain that radiates, N/V, diaphoresis or shortness of breath. Pt advised to use NTG but does not like to use. Pt verbalized understanding and had no additional questions. Appt was made .

## 2021-02-10 ENCOUNTER — Ambulatory Visit (INDEPENDENT_AMBULATORY_CARE_PROVIDER_SITE_OTHER): Payer: Medicare HMO

## 2021-02-10 ENCOUNTER — Ambulatory Visit: Payer: Medicare HMO | Admitting: Cardiology

## 2021-02-10 ENCOUNTER — Telehealth (HOSPITAL_COMMUNITY): Payer: Self-pay | Admitting: *Deleted

## 2021-02-10 ENCOUNTER — Encounter: Payer: Self-pay | Admitting: Cardiology

## 2021-02-10 ENCOUNTER — Other Ambulatory Visit: Payer: Self-pay

## 2021-02-10 VITALS — BP 112/72 | HR 58 | Ht 60.0 in | Wt 166.4 lb

## 2021-02-10 DIAGNOSIS — R079 Chest pain, unspecified: Secondary | ICD-10-CM

## 2021-02-10 DIAGNOSIS — R001 Bradycardia, unspecified: Secondary | ICD-10-CM

## 2021-02-10 DIAGNOSIS — I2510 Atherosclerotic heart disease of native coronary artery without angina pectoris: Secondary | ICD-10-CM

## 2021-02-10 DIAGNOSIS — I712 Thoracic aortic aneurysm, without rupture: Secondary | ICD-10-CM

## 2021-02-10 DIAGNOSIS — C182 Malignant neoplasm of ascending colon: Secondary | ICD-10-CM

## 2021-02-10 DIAGNOSIS — I7121 Aneurysm of the ascending aorta, without rupture: Secondary | ICD-10-CM

## 2021-02-10 HISTORY — DX: Bradycardia, unspecified: R00.1

## 2021-02-10 MED ORDER — RANOLAZINE ER 500 MG PO TB12: 500.0000 mg | ORAL_TABLET | Freq: Two times a day (BID) | ORAL | 1 refills | Status: DC

## 2021-02-10 NOTE — Telephone Encounter (Signed)
Left message on voicemail per DPR in reference to upcoming appointment scheduled on 02/18/21 at 11:15 with detailed instructions given per Myocardial Perfusion Study Information Sheet for the test. LM to arrive 15 minutes early, and that it is imperative to arrive on time for appointment to keep from having the test rescheduled. If you need to cancel or reschedule your appointment, please call the office within 24 hours of your appointment. Failure to do so may result in a cancellation of your appointment, and a $50 no show fee. Phone number given for call back for any questions.

## 2021-02-10 NOTE — Progress Notes (Signed)
Cardiology Office Note:    Date:  02/10/2021   ID:  [name], DOB [birth year], MRN [id number]  PCP:  Charlynn Court, NP  Cardiologist:  Jenne Campus, MD    Referring MD: Charlynn Court, NP   Chief Complaint  Patient presents with   Chest Pain   Low HR    History of Present Illness:    [name] is a 65 y.o. female   with past medical history significant for chronic back problem that eventually required surgery.  She still suffering from that.  She did have a cardiac catheterization 2000 with some 40% blockages then cardiac catheterization repeated 2018 showing no significant abnormalities.  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 She was referred back to [place] because of 2 concerns first she is bradycardic her heart rate on the EKG today was 50 the mechanism of bradycardia is sinus.  She is taking metoprolol.  Second concern is the fact that she is having chest pain she described pain as sharp lasting for up to 20 minutes that being on for last few months she took nitroglycerin with relief but she got horrible headache after nitroglycerin.  She also takes 3 aspirins when she gets pain and that usually helps as well.  Otherwise she seems to be doing well.  Recently she did see oncology she did get good report.  Past Medical History:  Diagnosis Date   Adjustment disorder    Anesthesia complication [id number]   Overview:  "It takes me a while to wake up."  Formatting of this note might be different from the original. "It takes me a while to wake up."   Anxiety 01/09/2018   Anxiety disorder    Arthritis    hands, back   Atypical chest pain 12/19/2016   Borderline diabetes 01/09/2018   CAD (coronary artery disease)    Cancer ([place]) 01/09/2018   Overview:  colon  Formatting of this note might be different from the original. colon   Cataract    removed by surgery    Chest pain    Colon cancer ([place]) 2012   Depression    Depression 01/09/2018   Dysphonia    Dysthymic disorder    Fatty liver 01/09/2018   Fusion of lumbar spine 08/21/2018   Gastroesophageal reflux disease    Hiatal hernia with GERD 01/09/2018   History of kidney stones 01/09/2018   Hyperlipidemia    Insomnia    Kidney stones    removed by surgery   MRSA (methicillin resistant Staphylococcus aureus)    MRSA infection 01/09/2018   Overview:  states winter of 2016, treated at [facility] by Lucita Lora NP  Formatting of this note might be different from the original. states winter of 2016, treated at [facility] by Lucita Lora NP   Neuropathy    Obesity    Palpitations 01/20/2017   Pneumonia    Pre-syncope    Sleep apnea     Past Surgical History:  Procedure Laterality Date   ABDOMINAL HYSTERECTOMY     BACK SURGERY     BLADDER SURGERY     CATARACT EXTRACTION, BILATERAL     COLON SURGERY  2012   Due to colon cancer stage 3   COLONOSCOPY  04/09/2015   Colonic polyp status post polypectomy. Mild pancolonic diverticulosis/ History of colon cancer status post right hemicolectomy. No evidence of recurrence  ESOPHAGOGASTRODUODENOSCOPY  05/26/2017   Schatzkis ring statust post esophageal dilatation. Hiatal hernia.    PORT-A-CATH REMOVAL     removal kidney stones     TUBAL LIGATION      Current Medications: Current Meds  Medication Sig   aspirin EC 81 MG tablet Take 81 mg by mouth daily.   B Complex-Folic Acid (B COMPLEX-VITAMIN B12 PO) Take 1 tablet by mouth daily. Unknown strength   CALCIUM PO Take 2 tablets by mouth daily. Unknown strength   Cholecalciferol (VITAMIN D3) 2000 units TABS Take 2,000 Units by mouth daily.   CINNAMON PO Take 1 tablet by mouth daily. Unknown strength   dexlansoprazole (DEXILANT) 60 MG capsule Take 1 capsule (60 mg total) by mouth daily.   DULoxetine (CYMBALTA) 60 MG capsule Take 60 mg by mouth daily.   famotidine (PEPCID) 40 MG tablet  Take 1 tablet (40 mg total) by mouth at bedtime.   gabapentin (NEURONTIN) 800 MG tablet Take 800 mg by mouth 3 (three) times daily.   meloxicam (MOBIC) 15 MG tablet Take 15 mg by mouth daily.   Multiple Vitamins-Minerals (CENTRUM SILVER 50+WOMEN) TABS Take 1 tablet by mouth daily. Unknown strength   nitroGLYCERIN (NITROSTAT) 0.4 MG SL tablet Place 0.4 mg under the tongue every 5 (five) minutes x 3 doses as needed for chest pain.    ranolazine (RANEXA) 500 MG 12 hr tablet Take 1 tablet (500 mg total) by mouth 2 (two) times daily.   rosuvastatin (CRESTOR) 20 MG tablet Take 20 mg by mouth at bedtime.   [DISCONTINUED] metoprolol succinate (TOPROL XL) 25 MG 24 hr tablet Take 1 tablet (25 mg total) by mouth 2 (two) times daily.   Current Facility-Administered Medications for the 02/10/21 encounter (Office Visit) with Park Liter, MD  Medication   0.9 %  sodium chloride infusion     Allergies:   Penicillins, Codeine, Aciphex [rabeprazole], Guaifenesin & derivatives, Latex, Other, Pseudoephedrine, Sulfa antibiotics, Sulfasalazine, and Tape   Social History   Socioeconomic History   Marital status: Widowed    Spouse name: Not on file   Number of children: Not on file   Years of education: Not on file   Highest education level: Not on file  Occupational History   Not on file  Tobacco Use   Smoking status: Never   Smokeless tobacco: Never  Vaping Use   Vaping Use: Never used  Substance and Sexual Activity   Alcohol use: No   Drug use: No   Sexual activity: Not Currently    Birth control/protection: Post-menopausal  Other Topics Concern   Not on file  Social History Narrative   Not on file   Social Determinants of Health   Financial Resource Strain: Not on file  Food Insecurity: Not on file  Transportation Needs: Not on file  Physical Activity: Not on file  Stress: Not on file  Social Connections: Not on file     Family History: The patient's family history includes CAD  in her mother; COPD in her father; CVA in her brother; Heart failure in her father; Hypertension in her mother; Kidney disease in her mother. There is no history of Colon cancer, Rectal cancer, or Stomach cancer. ROS:   Please see the history of present illness.    All 14 point review of systems negative except as described per history of present illness  EKGs/Labs/Other Studies Reviewed:      Recent Labs: 02/02/2021: ALT 19; BUN 13; Creatinine 1.0; Hemoglobin 13.2; Platelets 134;  Potassium 4.3; Sodium 137  Recent Lipid Panel No results found for: CHOL, TRIG, HDL, CHOLHDL, VLDL, LDLCALC, LDLDIRECT  Physical Exam:    VS:  BP 112/72 (BP Location: Left Arm, Patient Position: Sitting)   Pulse (!) 58   Ht 5' (1.524 m)   Wt 166 lb 6.4 oz (75.5 kg)   LMP  (LMP Unknown)   SpO2 95%   BMI 32.50 kg/m     Wt Readings from Last 3 Encounters:  02/10/21 166 lb 6.4 oz (75.5 kg)  02/02/21 165 lb 12.8 oz (75.2 kg)  12/02/20 167 lb (75.8 kg)     GEN:  Well nourished, well developed in no acute distress HEENT: Normal NECK: No JVD; No carotid bruits LYMPHATICS: No lymphadenopathy CARDIAC: RRR, no murmurs, no rubs, no gallops RESPIRATORY:  Clear to auscultation without rales, wheezing or rhonchi  ABDOMEN: Soft, non-tender, non-distended MUSCULOSKELETAL:  No edema; No deformity  SKIN: Warm and dry LOWER EXTREMITIES: no swelling NEUROLOGIC:  Alert and oriented x 3 PSYCHIATRIC:  Normal affect   ASSESSMENT:    1. Chest pain, unspecified type   2. Sinus bradycardia   3. Coronary artery disease involving native coronary artery of native heart without angina pectoris   4. Ascending aortic aneurysm (HCC) 38 mm based on echocardiogram from 2021   5. Malignant neoplasm of ascending colon (HCC)    PLAN:    In order of problems listed above:  Sinus bradycardia.  I will discontinue her metoprolol.  I will put monitor on her about 2 or 3 days after discontinuation of this medication.  She will  wear Zio patch for about 1 week.  I do not anticipate this bradycardia to be a problem after discontinuation of the medication. Chest pain with some atypical characteristic but worrisome enough to warrant investigation.  I will ask her to have Lexiscan to see if she got any inducible ischemia. Ascending aneurysm still present but only mild 38 mm continue monitoring.  She will require assessment next year. Oncology problem I did review note by oncology.   Medication Adjustments/Labs and Tests Ordered: Current medicines are reviewed at length with the patient today.  Concerns regarding medicines are outlined above.  Orders Placed This Encounter  Procedures   LONG TERM MONITOR (3-14 DAYS)   MYOCARDIAL PERFUSION IMAGING   Medication changes:  Meds ordered this encounter  Medications   ranolazine (RANEXA) 500 MG 12 hr tablet    Sig: Take 1 tablet (500 mg total) by mouth 2 (two) times daily.    Dispense:  60 tablet    Refill:  1    Signed, Park Liter, MD, [facility] 02/10/2021 12:28 PM    [facility]

## 2021-02-10 NOTE — Patient Instructions (Addendum)
Medication Instructions:  Your physician has recommended you make the following change in your medication: Stop Metoprolol and start Ranolazine '500mg'$  twice a day. *If you need a refill on your cardiac medications before your next appointment, please call your pharmacy*   Lab Work: None  If you have labs (blood work) drawn today and your tests are completely normal, you will receive your results only by: [place] (if you have MyChart) OR A paper copy in the mail If you have any lab test that is abnormal or we need to change your treatment, we will call you to review the results.   Testing/Procedures: Your physician has requested that you have a lexiscan myoview. For further information please visit [email or web address]. Please follow instruction sheet, as given.   ZIO XT- Long Term Monitor Instructions  Your physician has requested you wear a ZIO patch monitor for 7 days starting on 02/12/2021. Monitor was given to the patient to apply at home.  This is a single patch monitor. [employer] supplies one patch monitor per enrollment. Additional stickers are not available. Please do not apply patch if you will be having a Nuclear Stress Test,  Echocardiogram, Cardiac CT, MRI, or Chest Xray during the period you would be wearing the  monitor. The patch cannot be worn during these tests. You cannot remove and re-apply the  ZIO XT patch monitor.  Your ZIO patch monitor will be mailed 3 day USPS to your address on file. It may take 3-5 days  to receive your monitor after you have been enrolled.  Once you have received your monitor, please review the enclosed instructions. Your monitor  has already been registered assigning a specific monitor serial # to you.  Billing and Patient Assistance Program Information  We have supplied [employer] with any of your insurance information on file for billing purposes. [employer] offers a sliding scale Patient Assistance Program for patients that do not have   insurance, or whose insurance does not completely cover the cost of the ZIO monitor.  You must apply for the Patient Assistance Program to qualify for this discounted rate.  To apply, please call [employer] at [phone], select option 4, select option 2, ask to apply for  Patient Assistance Program. Theodore Demark will ask your household income, and how many people  are in your household. They will quote your out-of-pocket cost based on that information.  [employer] will also be able to set up a 88-month interest-free payment plan if needed.  Applying the monitor   Shave hair from upper left chest.  Hold abrader disc by orange tab. Rub abrader in 40 strokes over the upper left chest as  indicated in your monitor instructions.  Clean area with 4 enclosed alcohol pads. Let dry.  Apply patch as indicated in monitor instructions. Patch will be placed under collarbone on left  side of chest with arrow pointing upward.  Rub patch adhesive wings for 2 minutes. Remove white label marked "1". Remove the white  label marked "2". Rub patch adhesive wings for 2 additional minutes.  While looking in a mirror, press and release button in center of patch. A small green light will  flash 3-4 times. This will be your only indicator that the monitor has been turned on.  Do not shower for the first 24 hours. You may shower after the first 24 hours.  Press the button if you feel a symptom. You will hear a small click. Record Date, Time and  Symptom in the Patient Logbook.  When you are ready to remove the patch, follow instructions on the last 2 pages of Patient  Logbook. Stick patch monitor onto the last page of Patient Logbook.  Place Patient Logbook in the blue and white box. Use locking tab on box and tape box closed  securely. The blue and white box has prepaid postage on it. Please place it in the mailbox as  soon as possible. Your physician should have your test results approximately 7 days after the  monitor  has been mailed back to [facility].  Call [place] at [phone] if you have questions regarding  your ZIO XT patch monitor. Call them immediately if you see an orange light blinking on your  monitor.  If your monitor falls off in less than 4 days, contact our Monitor department at [phone].  If your monitor becomes loose or falls off after 4 days call [employer] at [phone] for  suggestions on securing your monitor    Follow-Up: At [facility], you and your health needs are our priority.  As part of our continuing mission to provide you with exceptional heart care, we have created designated Provider Care Teams.  These Care Teams include your primary Cardiologist (physician) and Advanced Practice Providers (APPs -  Physician Assistants and Nurse Practitioners) who all work together to provide you with the care you need, when you need it.  We recommend signing up for the patient portal called "MyChart".  Sign up information is provided on this After Visit Summary.  MyChart is used to connect with patients for Virtual Visits (Telemedicine).  Patients are able to view lab/test results, encounter notes, upcoming appointments, etc.  Non-urgent messages can be sent to your provider as well.   To learn more about what you can do with MyChart, go to [email or web address].    Your next appointment:   3 month(s)  The format for your next appointment:   In Person  Provider:   Jenne Campus, MD   Other Instructions

## 2021-02-17 ENCOUNTER — Telehealth: Payer: Self-pay

## 2021-02-17 NOTE — Addendum Note (Signed)
Addended by: Jenne Campus on: 02/17/2021 01:01 PM   Modules accepted: Orders

## 2021-02-17 NOTE — Addendum Note (Signed)
Addended by: Truddie Hidden on: 02/17/2021 12:28 PM   Modules accepted: Orders

## 2021-02-17 NOTE — Telephone Encounter (Signed)
Myocardial Perfusion approved form 02/11/21-08/10/21. [id number]   Letter process to scan

## 2021-02-18 ENCOUNTER — Ambulatory Visit (INDEPENDENT_AMBULATORY_CARE_PROVIDER_SITE_OTHER): Payer: Medicare HMO

## 2021-02-18 ENCOUNTER — Other Ambulatory Visit: Payer: Self-pay

## 2021-02-18 DIAGNOSIS — R079 Chest pain, unspecified: Secondary | ICD-10-CM | POA: Diagnosis not present

## 2021-02-18 LAB — MYOCARDIAL PERFUSION IMAGING
LV dias vol: 65 mL (ref 46–106)
LV sys vol: 17 mL
Nuc Stress EF: 74 %
Peak HR: 89 {beats}/min
Rest HR: 78 {beats}/min
Rest Nuclear Isotope Dose: 10.2 mCi
SDS: 0
SRS: 3
SSS: 3
Stress Nuclear Isotope Dose: 30.4 mCi
TID: 0.71

## 2021-02-18 MED ORDER — TECHNETIUM TC 99M TETROFOSMIN IV KIT
30.4000 | PACK | Freq: Once | INTRAVENOUS | Status: AC | PRN
  Administered 2021-02-18: 30.4 via INTRAVENOUS

## 2021-02-18 MED ORDER — REGADENOSON 0.4 MG/5ML IV SOLN
0.4000 mg | Freq: Once | INTRAVENOUS | Status: AC
  Administered 2021-02-18: 0.4 mg via INTRAVENOUS

## 2021-02-18 MED ORDER — TECHNETIUM TC 99M TETROFOSMIN IV KIT
10.2000 | PACK | Freq: Once | INTRAVENOUS | Status: AC | PRN
  Administered 2021-02-18: 10.2 via INTRAVENOUS

## 2021-02-19 ENCOUNTER — Telehealth: Payer: Self-pay

## 2021-02-19 NOTE — Telephone Encounter (Signed)
-----   Message from Park Liter, MD sent at 02/19/2021  2:51 PM EDT ----- Stress test showing no evidence of ischemia

## 2021-02-19 NOTE — Telephone Encounter (Signed)
Spoke with patient regarding results and recommendation.  Patient verbalizes understanding and is agreeable to plan of care. Advised patient to call back with any issues or concerns.  

## 2021-02-23 DIAGNOSIS — R079 Chest pain, unspecified: Secondary | ICD-10-CM | POA: Diagnosis not present

## 2021-02-26 DIAGNOSIS — Z1231 Encounter for screening mammogram for malignant neoplasm of breast: Secondary | ICD-10-CM | POA: Diagnosis not present

## 2021-04-08 ENCOUNTER — Telehealth: Payer: Self-pay | Admitting: Hematology and Oncology

## 2021-04-08 DIAGNOSIS — H04123 Dry eye syndrome of bilateral lacrimal glands: Secondary | ICD-10-CM | POA: Diagnosis not present

## 2021-04-13 ENCOUNTER — Other Ambulatory Visit: Payer: Self-pay | Admitting: Cardiology

## 2021-04-13 NOTE — Telephone Encounter (Signed)
Rx refill sent to pharmacy. 

## 2021-04-22 DIAGNOSIS — K769 Liver disease, unspecified: Secondary | ICD-10-CM | POA: Diagnosis not present

## 2021-04-22 DIAGNOSIS — I2510 Atherosclerotic heart disease of native coronary artery without angina pectoris: Secondary | ICD-10-CM | POA: Diagnosis not present

## 2021-04-22 DIAGNOSIS — I517 Cardiomegaly: Secondary | ICD-10-CM | POA: Diagnosis not present

## 2021-04-22 DIAGNOSIS — K449 Diaphragmatic hernia without obstruction or gangrene: Secondary | ICD-10-CM | POA: Diagnosis not present

## 2021-04-22 DIAGNOSIS — Z85038 Personal history of other malignant neoplasm of large intestine: Secondary | ICD-10-CM | POA: Diagnosis not present

## 2021-04-22 DIAGNOSIS — K5730 Diverticulosis of large intestine without perforation or abscess without bleeding: Secondary | ICD-10-CM | POA: Diagnosis not present

## 2021-04-23 ENCOUNTER — Telehealth: Payer: Self-pay

## 2021-04-23 NOTE — Telephone Encounter (Signed)
-----   Message from Derwood Kaplan, MD sent at 04/23/2021 10:25 AM EST ----- Regarding: call Tell her scan and labs look good, will see her in 2 weeks

## 2021-04-28 NOTE — Progress Notes (Signed)
[facility]  [address] [place],  [place]  [address] [phone]  Clinic Day:  05/05/2021  Referring physician: Charlynn Court, NP  This document serves as a record of services personally performed by Hosie Poisson, MD. It was created on their behalf by Curry,Lauren E, a trained medical scribe. The creation of this record is based on the scribe's personal observations and the provider's statements to them.  CHIEF COMPLAINT:  CC:  History of stage IIIB colon cancer with mild elevation of the CEA  Current Treatment:   Observation   HISTORY OF PRESENT ILLNESS:  [name] is a 65 y.o. 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  She was treated with surgical resection.  Pathology revealed a 3.6 centimeter, grade 2, adenocarcinoma with 3 of 45 nodes positive for metastasis.  She received adjuvant chemotherapy with FOLFOX for 5 cycles.  Oxaliplatin was then discontinued because of severe cytopenias.  She continued 5 fluorouracil and leucovorin for 4 more cycles, but then developed a severe allergic reaction to the 5-FU.  She was then placed on Xeloda for 6 weeks to complete a full 6 months of adjuvant chemotherapy.  She also had difficulty tolerating the Xeloda.  She has not had evidence of recurrence.  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  The patient is a nonsmoker.  She had a CT abdomen/pelvis in June 2016 for abdominal pain, but this was negative other than a small hiatal hernia.  MRI of the lumbar spine in July 2019 revealed left sided neural impingement at L1-2 with multilevel spondylosis. She had spinal surgery with Dr. Sherlyn Lick.  Her CEA was mildly elevated in April 2021 at 5.5. CT chest, abdomen and pelvis at that time did  not reveal any evidence of malignancy. The CEA was 5 in July, then 5.2 in October. Colonoscopy with Dr. Lyndel Safe in December 2021 was unremarkable. Repeat in 5 years was recommended.   The CEA went up to 7.5 in January 2022, and went down to 7.0 in February. CT chest, abdomen and pelvis from February did not reveal any evidence of local recurrence of metastatic disease. There was a stable moderate to large hiatal hernia noted.  The CEA was down to 5.6 in May, then 5.2 in August. She states she has 40% coronary artery blockage, for which she is on aspirin 81 mg daily, metoprolol 25 mg twice daily, nitroglycerin prn, and rosuvastatin 20 mg daily through Dr. Agustin Cree.   INTERVAL HISTORY:  [name] is here for routine follow up and states that she has been well and denies complaints. CT chest, abdomen and pelvis from November 9th revealed no findings to suggest metastatic disease, and no acute findings to account for the patient's history of left upper quadrant abdominal pain or for the elevated CEA. Annual mammogram from September was clear. Blood counts and chemistries are unremarkable. CEA was 5.9, previously 5.2 in August. However, this has been fluctuating up and down. She has never been a smoker. Her  appetite is good, and she has lost 1 pound since her last visit.  She denies fever, chills or other signs of infection.  She denies nausea, vomiting, bowel issues, or abdominal pain.  She denies sore throat, cough, dyspnea, or chest pain.  REVIEW OF SYSTEMS:  Review of Systems  Constitutional: Negative.  Negative for appetite change, chills, fatigue, fever and unexpected weight change.  HENT:  Negative.    Eyes: Negative.   Respiratory: Negative.  Negative for chest tightness, cough, hemoptysis, shortness of breath and wheezing.   Cardiovascular: Negative.  Negative for chest pain, leg swelling and palpitations.  Gastrointestinal: Negative.  Negative for abdominal distention, abdominal pain, blood in stool,  constipation, diarrhea, nausea and vomiting.  Endocrine: Negative.   Genitourinary: Negative.  Negative for difficulty urinating, dysuria, frequency and hematuria.   Musculoskeletal: Negative.  Negative for arthralgias, back pain, flank pain, gait problem and myalgias.  Skin: Negative.   Neurological: Negative.  Negative for dizziness, extremity weakness, gait problem, headaches, light-headedness, numbness, seizures and speech difficulty.  Hematological: Negative.   Psychiatric/Behavioral: Negative.  Negative for depression and sleep disturbance. The patient is not nervous/anxious.     VITALS:  Blood pressure (!) 147/86, pulse 80, temperature 98.1 F (36.7 C), resp. rate 18, height 5' (1.524 m), weight 165 lb 1.6 oz (74.9 kg), SpO2 95 %.  Wt Readings from Last 3 Encounters:  05/05/21 165 lb 1.6 oz (74.9 kg)  02/18/21 166 lb (75.3 kg)  02/10/21 166 lb 6.4 oz (75.5 kg)    Body mass index is 32.24 kg/m.  Performance status (ECOG): 0 - Asymptomatic  PHYSICAL EXAM:  Physical Exam Constitutional:      General: She is not in acute distress.    Appearance: Normal appearance. She is normal weight.  HENT:     Head: Normocephalic and atraumatic.  Eyes:     General: No scleral icterus.    Extraocular Movements: Extraocular movements intact.     Conjunctiva/sclera: Conjunctivae normal.     Pupils: Pupils are equal, round, and reactive to light.  Cardiovascular:     Rate and Rhythm: Normal rate and regular rhythm.     Pulses: Normal pulses.     Heart sounds: Normal heart sounds. No murmur heard.   No friction rub. No gallop.  Pulmonary:     Effort: Pulmonary effort is normal. No respiratory distress.     Breath sounds: Normal breath sounds.  Abdominal:     General: Bowel sounds are normal. There is no distension.     Palpations: Abdomen is soft. There is no hepatomegaly, splenomegaly or mass.     Tenderness: There is no abdominal tenderness.  Musculoskeletal:        General: Normal  range of motion.     Cervical back: Normal range of motion and neck supple.     Right lower leg: No edema.     Left lower leg: No edema.  Lymphadenopathy:     Cervical: No cervical adenopathy.  Skin:    General: Skin is warm and dry.  Neurological:     General: No focal deficit present.     Mental Status: She is alert and oriented to person, place, and time. Mental status is at baseline.  Psychiatric:        Mood and Affect: Mood normal.        Behavior: Behavior normal.        Thought Content: Thought content normal.        Judgment: Judgment normal.    LABS:   CBC Latest Ref Rng & Units 05/04/2021 02/02/2021 10/30/2020  WBC - 5.3 5.8 TEST REQUEST RECEIVED WITHOUT APPROPRIATE SPECIMEN  Hemoglobin 12.0 - 16.0 13.1 13.2 TEST REQUEST RECEIVED WITHOUT APPROPRIATE SPECIMEN  Hematocrit 36 - 46  40 40 TEST REQUEST RECEIVED WITHOUT APPROPRIATE SPECIMEN  Platelets 150 - 399 139(A) 134(A) TEST REQUEST RECEIVED WITHOUT APPROPRIATE SPECIMEN   CMP Latest Ref Rng & Units 05/04/2021 02/02/2021 10/30/2020  Glucose 65 - 99 mg/dL - - -  BUN 4 - 21 4 13 14   Creatinine 0.5 - 1.1 1.1 1.0 1.0  Sodium 137 - 147 140 137 136(A)  Potassium 3.4 - 5.3 4.2 4.3 4.4  Chloride 99 - 108 106 106 105  CO2 13 - 22 27(A) 22 27(A)  Calcium 8.7 - 10.7 9.2 9.4 9.0  Total Protein 6.5 - 8.1 g/dL - - -  Total Bilirubin 0.3 - 1.2 mg/dL - - -  Alkaline Phos 25 - 125 99 79 82  AST 13 - 35 32 34 30  ALT 7 - 35 17 19 19      Lab Results  Component Value Date   CEA1 5.9 04/22/2021   /  CEA  Date Value Ref Range Status  04/22/2021 5.9  Final    STUDIES:  No results found.   EXAM: 02/26/2021 DIGITAL SCREENING BILATERAL MAMMOGRAM WITH TOMOSYNTHESIS AND CAD  TECHNIQUE: Bilateral screening digital craniocaudal and mediolateral oblique mammograms were obtained. Bilateral screening digital breast tomosynthesis was performed. The images were evaluated with computer-aided detection.  COMPARISON: Previous  exam(s).  ACR Breast Density Category c: The breast tissue is heterogeneously dense, which may obscure small masses.  FINDINGS: There are no findings suspicious for malignancy.  IMPRESSION: No mammographic evidence of malignancy. A result letter of this screening mammogram will be mailed directly to the patient.   EXAM: 04/22/2021 CT CHEST, ABDOMEN, AND PELVIS WITH CONTRAST  TECHNIQUE: Multidetector CT imaging of the chest, abdomen and pelvis was performed following the standard protocol during bolus administration of intravenous contrast.  CONTRAST: 100 mL of Isovue 370  COMPARISON: CT the chest, abdomen and pelvis 07/23/2020.  FINDINGS: CT CHEST FINDINGS Cardiovascular: Heart size is mildly enlarged with left atrial dilatation. There is no significant pericardial fluid, thickening or pericardial calcification. There is aortic atherosclerosis, as well as atherosclerosis of the great vessels of the mediastinum and the coronary arteries, including calcified atherosclerotic plaque in the left anterior descending coronary artery. Mediastinum/Nodes: No pathologically enlarged mediastinal or hilar lymph nodes. Large hiatal hernia. No axillary lymphadenopathy. Lungs/Pleura: No suspicious appearing pulmonary nodules or masses are noted. No acute consolidative airspace disease. No pleural effusions. Musculoskeletal: There are no aggressive appearing lytic or blastic lesions noted in the visualized portions of the skeleton.  CT ABDOMEN PELVIS FINDINGS Hepatobiliary: 1.4 x 1.2 cm low-attenuation lesion between segments 5 and 6 in the liver, similar to prior study, compatible with a benign cyst. No other suspicious appearing hepatic lesions. No intra or extrahepatic biliary ductal dilatation. Gallbladder is normal in appearance. Pancreas: No pancreatic mass. No pancreatic ductal dilatation. No pancreatic or peripancreatic fluid collections or inflammatory changes. Spleen:  Unremarkable. Adrenals/Urinary Tract: Bilateral kidneys and adrenal glands are normal in appearance. No hydroureteronephrosis. Urinary bladder is normal in appearance. Stomach/Bowel: Intra-abdominal portion of the stomach is unremarkable. No pathologic dilatation of small bowel or colon. Several colonic diverticulae are noted, particularly in the distal descending colon and proximal sigmoid colon, without surrounding inflammatory changes to suggest an acute diverticulitis at this time. Postoperative changes of prior partial colectomy are noted in the right side of the colon. Vascular/Lymphatic: Aortic atherosclerosis, without evidence of aneurysm or dissection in the abdominal or pelvic vasculature. No lymphadenopathy noted in the abdomen or pelvis. Reproductive: Status post hysterectomy. Ovaries are  not confidently identified may be surgically absent or atrophic. Other: No significant volume of ascites. No pneumoperitoneum. Musculoskeletal: Status post left-sided PLIF at L4-L5 with interbody cage at L4-L5 interspace. There are no aggressive appearing lytic or blastic lesions noted in the visualized portions of the skeleton.  IMPRESSION: 1. No findings to suggest metastatic disease in the chest, abdomen or pelvis. 2. No acute findings to account for the patient's history of left upper quadrant abdominal pain. 3. Mild colonic diverticulosis without evidence of acute diverticulitis at this time. 4. Large hiatal hernia. 5. Aortic atherosclerosis. 6. Cardiomegaly with left atrial dilatation. 7. Additional incidental findings, as above.  HISTORY:   Allergies:  Allergies  Allergen Reactions   Penicillins Rash    Has patient had a PCN reaction causing immediate rash, facial/tongue/throat swelling, SOB or lightheadedness with hypotension: Yes Has patient had a PCN reaction causing severe rash involving mucus membranes or skin necrosis: No Has patient had a PCN reaction that required  hospitalization: No Has patient had a PCN reaction occurring within the last 10 years: No If all of the above answers are "NO", then may proceed with Cephalosporin use.    Codeine Other (See Comments)    Dizziness and lethargy    Aciphex [Rabeprazole] Rash   Guaifenesin & Derivatives Rash   Latex Rash    Usually from adhesive tapes   Other Rash    Tape cannot be on the skin for an extended period of time   Pseudoephedrine Rash   Sulfa Antibiotics Rash   Sulfasalazine Rash   Tape Rash    Cannot be on the skin for an extended period of time    Current Medications: Current Outpatient Medications  Medication Sig Dispense Refill   aspirin EC 81 MG tablet Take 81 mg by mouth daily.     B Complex-Folic Acid (B COMPLEX-VITAMIN B12 PO) Take 1 tablet by mouth daily. Unknown strength     CALCIUM PO Take 2 tablets by mouth daily. Unknown strength     Cholecalciferol (VITAMIN D3) 2000 units TABS Take 2,000 Units by mouth daily.     CINNAMON PO Take 1 tablet by mouth daily. Unknown strength     dexlansoprazole (DEXILANT) 60 MG capsule Take 1 capsule (60 mg total) by mouth daily. 90 capsule 4   DULoxetine (CYMBALTA) 60 MG capsule Take 60 mg by mouth daily.  1   famotidine (PEPCID) 40 MG tablet Take 1 tablet (40 mg total) by mouth at bedtime. 90 tablet 2   gabapentin (NEURONTIN) 800 MG tablet Take 800 mg by mouth 3 (three) times daily.     meloxicam (MOBIC) 15 MG tablet Take 15 mg by mouth daily.     Multiple Vitamins-Minerals (CENTRUM SILVER 50+WOMEN) TABS Take 1 tablet by mouth daily. Unknown strength     nitroGLYCERIN (NITROSTAT) 0.4 MG SL tablet Place 0.4 mg under the tongue every 5 (five) minutes x 3 doses as needed for chest pain.      ranolazine (RANEXA) 500 MG 12 hr tablet Take 1 tablet by mouth twice daily 60 tablet 2   rosuvastatin (CRESTOR) 20 MG tablet Take 20 mg by mouth at bedtime.     Current Facility-Administered Medications  Medication Dose Route Frequency Provider Last Rate  Last Admin   0.9 %  sodium chloride infusion  500 mL Intravenous Once Jackquline Denmark, MD         ASSESSMENT & PLAN:   Assessment:   1. History of stage IIIB colon cancer diagnosed in  December 2012.  The patient remains without obvious evidence of recurrence.  Colonoscopy in December 2021 was unremarkable and she will be due repeat examination in 2026.  2. Mild elevation of the CEA of uncertain etiology.  CT imaging in February and November 2022 did not reveal any evidence of malignancy.  CEA remains mildly elevated, but in stable range.   3. Depression/Anxiety. I asked her to see her primary care provider to discuss.   4. 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 Sherlyn Lick.  Plan:   We will see her back in 4-6 months with a CBC, comprehensive metabolic panel and CEA for continued close follow-up due to the elevated CEA.  The patient understands the plans discussed today and is in agreement with them.  She knows to contact our office if she develops concerns prior to her next appointment.  I provided 15 minutes of face-to-face time during this this encounter and > 50% was spent counseling as documented under my assessment and plan.    I, Rita Ohara, am acting as scribe for Derwood Kaplan, MD  I have reviewed this report as typed by the medical scribe, and it is complete and accurate.

## 2021-05-04 ENCOUNTER — Encounter: Payer: Self-pay | Admitting: Oncology

## 2021-05-04 LAB — BASIC METABOLIC PANEL
BUN: 4 (ref 4–21)
CO2: 27 — AB (ref 13–22)
Chloride: 106 (ref 99–108)
Creatinine: 1.1 (ref 0.5–1.1)
Glucose: 95
Potassium: 4.2 (ref 3.4–5.3)
Sodium: 140 (ref 137–147)

## 2021-05-04 LAB — CBC AND DIFFERENTIAL
HCT: 40 (ref 36–46)
Hemoglobin: 13.1 (ref 12.0–16.0)
Neutrophils Absolute: 3.29
Platelets: 139 — AB (ref 150–399)
WBC: 5.3

## 2021-05-04 LAB — HEPATIC FUNCTION PANEL
ALT: 17 (ref 7–35)
AST: 32 (ref 13–35)
Alkaline Phosphatase: 99 (ref 25–125)
Bilirubin, Total: 0.6

## 2021-05-04 LAB — CBC: RBC: 4.36 (ref 3.87–5.11)

## 2021-05-04 LAB — COMPREHENSIVE METABOLIC PANEL
Albumin: 4.1 (ref 3.5–5.0)
Calcium: 9.2 (ref 8.7–10.7)

## 2021-05-04 LAB — CEA: CEA: 5.9

## 2021-05-05 ENCOUNTER — Telehealth: Payer: Self-pay | Admitting: Oncology

## 2021-05-05 ENCOUNTER — Encounter: Payer: Self-pay | Admitting: Oncology

## 2021-05-05 ENCOUNTER — Other Ambulatory Visit: Payer: Self-pay

## 2021-05-05 ENCOUNTER — Other Ambulatory Visit: Payer: Self-pay | Admitting: Oncology

## 2021-05-05 ENCOUNTER — Inpatient Hospital Stay: Payer: Medicare HMO | Attending: Oncology | Admitting: Oncology

## 2021-05-05 VITALS — BP 147/86 | HR 80 | Temp 98.1°F | Resp 18 | Ht 60.0 in | Wt 165.1 lb

## 2021-05-05 DIAGNOSIS — R978 Other abnormal tumor markers: Secondary | ICD-10-CM

## 2021-05-05 DIAGNOSIS — C182 Malignant neoplasm of ascending colon: Secondary | ICD-10-CM

## 2021-05-05 DIAGNOSIS — M79605 Pain in left leg: Secondary | ICD-10-CM | POA: Diagnosis not present

## 2021-05-05 DIAGNOSIS — I83893 Varicose veins of bilateral lower extremities with other complications: Secondary | ICD-10-CM | POA: Diagnosis not present

## 2021-05-05 HISTORY — DX: Varicose veins of bilateral lower extremities with other complications: I83.893

## 2021-05-05 NOTE — Telephone Encounter (Signed)
Per 11/22 LOS, patient scheduled for April 2023 Appt's.  Left Msg Mailing April Appt's.  If it is not convenient for you, please call to reschedule

## 2021-05-06 DIAGNOSIS — R062 Wheezing: Secondary | ICD-10-CM | POA: Diagnosis not present

## 2021-05-06 DIAGNOSIS — R051 Acute cough: Secondary | ICD-10-CM | POA: Diagnosis not present

## 2021-05-06 DIAGNOSIS — M791 Myalgia, unspecified site: Secondary | ICD-10-CM | POA: Diagnosis not present

## 2021-05-06 DIAGNOSIS — R0981 Nasal congestion: Secondary | ICD-10-CM | POA: Diagnosis not present

## 2021-05-06 DIAGNOSIS — J209 Acute bronchitis, unspecified: Secondary | ICD-10-CM | POA: Diagnosis not present

## 2021-05-11 ENCOUNTER — Encounter: Payer: Self-pay | Admitting: Oncology

## 2021-05-19 ENCOUNTER — Ambulatory Visit: Payer: Medicare HMO | Admitting: Cardiology

## 2021-05-19 ENCOUNTER — Other Ambulatory Visit: Payer: Self-pay

## 2021-05-19 VITALS — BP 138/70 | HR 68 | Ht 60.0 in | Wt 165.6 lb

## 2021-05-19 DIAGNOSIS — K219 Gastro-esophageal reflux disease without esophagitis: Secondary | ICD-10-CM

## 2021-05-19 DIAGNOSIS — I7121 Aneurysm of the ascending aorta, without rupture: Secondary | ICD-10-CM | POA: Diagnosis not present

## 2021-05-19 DIAGNOSIS — R001 Bradycardia, unspecified: Secondary | ICD-10-CM | POA: Diagnosis not present

## 2021-05-19 DIAGNOSIS — K449 Diaphragmatic hernia without obstruction or gangrene: Secondary | ICD-10-CM | POA: Diagnosis not present

## 2021-05-19 DIAGNOSIS — E782 Mixed hyperlipidemia: Secondary | ICD-10-CM

## 2021-05-19 MED ORDER — DILTIAZEM HCL ER COATED BEADS 120 MG PO CP24: 120.0000 mg | ORAL_CAPSULE | Freq: Every day | ORAL | 1 refills | Status: DC

## 2021-05-19 NOTE — Progress Notes (Signed)
Cardiology Office Note:    Date:  05/19/2021   ID:  [name], DOB [birth year], MRN [id number]  PCP:  Charlynn Court, NP  Cardiologist:  Jenne Campus, MD    Referring MD: Charlynn Court, NP   Chief Complaint  Patient presents with   Follow-up  Doing well  History of Present Illness:    [name] is a 65 y.o. female    with past medical history significant for chronic back problem that eventually required surgery.  She still suffering from that.  She did have a cardiac catheterization 2000 with some 40% blockages then cardiac catheterization repeated 2018 showing no significant abnormalities.  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 She was referred to [name] for 2 concerns 1 was sinus bradycardia that being addressed with discontinuation of beta-blocker as well as monitor which did not show any critical arrhythmia, also atypical chest pain that being addressed with stress testing which showed no evidence of ischemia.  She comes today to my office to discuss results of her tests.  Her monitor shows some episode of supraventricular tachycardia, short lasting and not symptomatic.  She was before beta-blocker but that being discontinued because of bradycardia.  Now bradycardia is not a problem anymore.  We did discuss option about potentially putting her on long-acting calcium channel blocker.  She accept that offer.  Overall she is doing well.  Denies have any chest pain tightness squeezing pressure burning chest, the biggest problem she is staggering from is a problem with back she did have 2 surgeries already but not much help.  Past Medical History:  Diagnosis Date   Adjustment disorder    Anesthesia complication 09/02/252   Overview:  "It takes me a while to wake up."  Formatting of this note might be different from the original. "It takes me a while to wake up."    Anxiety 01/09/2018   Anxiety disorder    Arthritis    hands, back   Ascending aortic aneurysm ([name]) 38 mm based on echocardiogram from 2021 05/29/2020   Atypical chest pain 12/19/2016   Borderline diabetes 01/09/2018   CAD (coronary artery disease)    Cancer ([name]) 01/09/2018   Overview:  colon  Formatting of this note might be different from the original. colon   Cataract    removed by surgery   Chest pain    Colon cancer ([name]) 2012   Depression    Depression 01/09/2018   Dysphonia    Dysthymic disorder    Fatty liver 01/09/2018   Fusion of lumbar spine 08/21/2018   Gastroesophageal reflux disease    Hiatal hernia with GERD 01/09/2018   History of kidney stones 01/09/2018   Hyperlipidemia    Insomnia    Kidney stones    removed by surgery   MRSA (methicillin resistant Staphylococcus aureus)    MRSA infection 01/09/2018   Overview:  states winter of 2016, treated at [facility] by Lucita Lora NP  Formatting of this note might be different from the original. states winter of 2016, treated at [facility] by Lucita Lora NP   Neuropathy    Obesity    Palpitations 01/20/2017   Pneumonia    Pre-syncope    Sinus bradycardia 02/10/2021   Sleep apnea    Varicose veins of bilateral lower extremities with other complications 27/11/2374    Past Surgical History:  Procedure Laterality Date  ABDOMINAL HYSTERECTOMY     BACK SURGERY     BLADDER SURGERY     CATARACT EXTRACTION, BILATERAL     COLON SURGERY  2012   Due to colon cancer stage 3   COLONOSCOPY  04/09/2015   Colonic polyp status post polypectomy. Mild pancolonic diverticulosis/ History of colon cancer status post right hemicolectomy. No evidence of recurrence    ESOPHAGOGASTRODUODENOSCOPY  05/26/2017   Schatzkis ring statust post esophageal dilatation. Hiatal hernia.    PORT-A-CATH REMOVAL     removal kidney stones     TUBAL LIGATION      Current Medications: Current Meds  Medication Sig   aspirin EC 81 MG tablet  Take 81 mg by mouth daily.   B Complex-Folic Acid (B COMPLEX-VITAMIN B12 PO) Take 1 tablet by mouth daily. Unknown strength   CALCIUM PO Take 2 tablets by mouth daily. Unknown strength   Cholecalciferol (VITAMIN D3) 2000 units TABS Take 2,000 Units by mouth daily.   CINNAMON PO Take 1 tablet by mouth daily. Unknown strength   dexlansoprazole (DEXILANT) 60 MG capsule Take 1 capsule (60 mg total) by mouth daily.   DULoxetine (CYMBALTA) 60 MG capsule Take 60 mg by mouth daily.   famotidine (PEPCID) 40 MG tablet Take 1 tablet (40 mg total) by mouth at bedtime.   gabapentin (NEURONTIN) 800 MG tablet Take 800 mg by mouth 3 (three) times daily.   meloxicam (MOBIC) 15 MG tablet Take 15 mg by mouth daily.   Multiple Vitamins-Minerals (CENTRUM SILVER 50+WOMEN) TABS Take 1 tablet by mouth daily. Unknown strength   nitroGLYCERIN (NITROSTAT) 0.4 MG SL tablet Place 0.4 mg under the tongue every 5 (five) minutes x 3 doses as needed for chest pain.    ranolazine (RANEXA) 500 MG 12 hr tablet Take 1 tablet by mouth twice daily   rosuvastatin (CRESTOR) 20 MG tablet Take 20 mg by mouth at bedtime.   Current Facility-Administered Medications for the 05/19/21 encounter (Office Visit) with Park Liter, MD  Medication   0.9 %  sodium chloride infusion     Allergies:   Penicillins, Codeine, Aciphex [rabeprazole], Guaifenesin & derivatives, Latex, Other, Pseudoephedrine, Sulfa antibiotics, Sulfasalazine, and Tape   Social History   Socioeconomic History   Marital status: Widowed    Spouse name: Not on file   Number of children: Not on file   Years of education: Not on file   Highest education level: Not on file  Occupational History   Not on file  Tobacco Use   Smoking status: Never   Smokeless tobacco: Never  Vaping Use   Vaping Use: Never used  Substance and Sexual Activity   Alcohol use: No   Drug use: No   Sexual activity: Not Currently    Birth control/protection: Post-menopausal  Other  Topics Concern   Not on file  Social History Narrative   Not on file   Social Determinants of Health   Financial Resource Strain: Not on file  Food Insecurity: Not on file  Transportation Needs: Not on file  Physical Activity: Not on file  Stress: Not on file  Social Connections: Not on file     Family History: The patient's family history includes CAD in her mother; COPD in her father; CVA in her brother; Heart failure in her father; Hypertension in her mother; Kidney disease in her mother. There is no history of Colon cancer, Rectal cancer, or Stomach cancer. ROS:   Please see the history of present illness.  All 14 point review of systems negative except as described per history of present illness  EKGs/Labs/Other Studies Reviewed:      Recent Labs: 05/04/2021: ALT 17; BUN 4; Creatinine 1.1; Hemoglobin 13.1; Platelets 139; Potassium 4.2; Sodium 140  Recent Lipid Panel No results found for: CHOL, TRIG, HDL, CHOLHDL, VLDL, LDLCALC, LDLDIRECT  Physical Exam:    VS:  BP 138/70 (BP Location: Right Arm, Patient Position: Sitting)   Pulse 68   Ht 5' (1.524 m)   Wt 165 lb 9.6 oz (75.1 kg)   LMP  (LMP Unknown)   SpO2 98%   BMI 32.34 kg/m     Wt Readings from Last 3 Encounters:  05/19/21 165 lb 9.6 oz (75.1 kg)  05/05/21 165 lb 1.6 oz (74.9 kg)  02/18/21 166 lb (75.3 kg)     GEN:  Well nourished, well developed in no acute distress HEENT: Normal NECK: No JVD; No carotid bruits LYMPHATICS: No lymphadenopathy CARDIAC: RRR, no murmurs, no rubs, no gallops RESPIRATORY:  Clear to auscultation without rales, wheezing or rhonchi  ABDOMEN: Soft, non-tender, non-distended MUSCULOSKELETAL:  No edema; No deformity  SKIN: Warm and dry LOWER EXTREMITIES: no swelling NEUROLOGIC:  Alert and oriented x 3 PSYCHIATRIC:  Normal affect   ASSESSMENT:    1. Aneurysm of ascending aorta without rupture   2. Sinus bradycardia   3. Hiatal hernia with GERD   4. Mixed hyperlipidemia     PLAN:    In order of problems listed above:  Ascending aortic enlargement only 38 mm.  Asymptomatic.  We will continue follow-up. Sinus bradycardia: Doing well from that point of view Mixed dyslipidemia I did review K PAD now that show me her LDL of 42 HDL 69 she is on Crestor however that is from 2017 we will call primary care physician to get copy of her report. Supraventricular tachycardia which is a new discovery, will give her Cardizem CD 120 daily   Medication Adjustments/Labs and Tests Ordered: Current medicines are reviewed at length with the patient today.  Concerns regarding medicines are outlined above.  No orders of the defined types were placed in this encounter.  Medication changes: No orders of the defined types were placed in this encounter.   Signed, Park Liter, MD, [facility] 05/19/2021 1:21 PM    [place]

## 2021-05-19 NOTE — Patient Instructions (Signed)
Medication Instructions:  Your physician has recommended you make the following change in your medication:  START: Cardizem 120 mg daily.   *If you need a refill on your cardiac medications before your next appointment, please call your pharmacy*   Lab Work: None If you have labs (blood work) drawn today and your tests are completely normal, you will receive your results only by: [place] (if you have MyChart) OR A paper copy in the mail If you have any lab test that is abnormal or we need to change your treatment, we will call you to review the results.   Testing/Procedures: None   Follow-Up: At [facility], you and your health needs are our priority.  As part of our continuing mission to provide you with exceptional heart care, we have created designated Provider Care Teams.  These Care Teams include your primary Cardiologist (physician) and Advanced Practice Providers (APPs -  Physician Assistants and Nurse Practitioners) who all work together to provide you with the care you need, when you need it.  We recommend signing up for the patient portal called "MyChart".  Sign up information is provided on this After Visit Summary.  MyChart is used to connect with patients for Virtual Visits (Telemedicine).  Patients are able to view lab/test results, encounter notes, upcoming appointments, etc.  Non-urgent messages can be sent to your provider as well.   To learn more about what you can do with MyChart, go to [email or web address].    Your next appointment:   6 month(s)  The format for your next appointment:   In Person  Provider:       Other Instructions  Diltiazem Extended-Release Capsules or Tablets What is this medication? DILTIAZEM (dil TYE a zem) treats high blood pressure and prevents chest pain (angina). It works by relaxing the blood vessels, which helps decrease the amount of work your heart has to do. It belongs to a group of medications called calcium  channel blockers. This medicine may be used for other purposes; ask your health care provider or pharmacist if you have questions. COMMON BRAND NAME(S): Cardizem CD, Cardizem LA, Cardizem SR, Cartia XT, Dilacor XR, Dilt-CD, Diltia XT, Diltzac, Matzim LA, Rema Fendt, TIADYLT ER, Tiamate, Tiazac What should I tell my care team before I take this medication? They need to know if you have any of these conditions: Heart attack Heart disease Irregular heartbeat or rhythm Low blood pressure An unusual or allergic reaction to diltiazem, other medications, foods, dyes, or preservatives Pregnant or trying to get pregnant Breast-feeding How should I use this medication? Take this medication by mouth. Take it as directed on the prescription label at the same time every day. Do not cut, crush or chew this medication. Swallow the capsules whole. You can take it with or without food. If it upsets your stomach, take it with food. Keep taking it unless your care team tells you to stop. Talk to your care team about the use of this medication in children. Special care may be needed. Overdosage: If you think you have taken too much of this medicine contact a poison control center or emergency room at once. NOTE: This medicine is only for you. Do not share this medicine with others. What if I miss a dose? If you miss a dose, take it as soon as you can. If it is almost time for your next dose, take only that dose. Do not take double or extra doses. What may interact with this medication? Do  not take this medication with any of the following: Cisapride [place] Pimozide Ranolazine Red yeast rice This medication may also interact with the following: Buspirone Carbamazepine Cimetidine Cyclosporine Digoxin Local anesthetics or general anesthetics Lovastatin Medications for anxiety or difficulty sleeping like midazolam and triazolam Medications for high blood pressure or heart problems Quinidine Rifampin,  rifabutin, or rifapentine This list may not describe all possible interactions. Give your health care provider a list of all the medicines, herbs, non-prescription drugs, or dietary supplements you use. Also tell them if you smoke, drink alcohol, or use illegal drugs. Some items may interact with your medicine. What should I watch for while using this medication? Visit your care team for regular checks on your progress. Check your blood pressure as directed. Ask your care team what your blood pressure should be. Do not treat yourself for coughs, colds, or pain while you are using this medication without asking your care team for advice. Some medications may increase your blood pressure. This medication may cause serious skin reactions. They can happen weeks to months after starting the medication. Contact your care team right away if you notice fevers or flu-like symptoms with a rash. The rash may be red or purple and then turn into blisters or peeling of the skin. Or, you might notice a red rash with swelling of the face, lips or lymph nodes in your neck or under your arms. You may get drowsy or dizzy. Do not drive, use machinery, or do anything that needs mental alertness until you know how this medication affects you. Do not stand up or sit up quickly, especially if you are an older patient. This reduces the risk of dizzy or fainting spells. What side effects may I notice from receiving this medication? Side effects that you should report to your care team as soon as possible: Allergic reactions--skin rash, itching, hives, swelling of the face, lips, tongue, or throat Heart failure--shortness of breath, swelling of the ankles, feet, or hands, sudden weight gain, unusual weakness or fatigue Slow heartbeat--dizziness, feeling faint or lightheaded, trouble breathing, unusual weakness or fatigue Liver injury--right upper belly pain, loss of appetite, nausea, light-colored stool, dark yellow or brown  urine, yellowing skin or eyes, unusual weakness or fatigue Low blood pressure--dizziness, feeling faint or lightheaded, blurry vision Redness, blistering, peeling, or loosening of the skin, including inside the mouth Side effects that usually do not require medical attention (report to your care team if they continue or are bothersome): Constipation Facial flushing, redness Headache This list may not describe all possible side effects. Call your doctor for medical advice about side effects. You may report side effects to FDA at 1-800-FDA-1088. Where should I keep my medication? Keep out of the reach of children and pets. Store at room temperature between 20 and 25 degrees C (68 and 77 degrees F). Protect from moisture. Keep the container tightly closed. Throw away any unused medication after the expiration date. NOTE: This sheet is a summary. It may not cover all possible information. If you have questions about this medicine, talk to your doctor, pharmacist, or health care provider.  2022 Elsevier/Gold Standard (2021-02-17 00:00:00)

## 2021-05-19 NOTE — Addendum Note (Signed)
Addended by: Senaida Ores on: 05/19/2021 01:35 PM   Modules accepted: Orders

## 2021-06-17 DIAGNOSIS — I10 Essential (primary) hypertension: Secondary | ICD-10-CM | POA: Diagnosis not present

## 2021-06-17 DIAGNOSIS — E119 Type 2 diabetes mellitus without complications: Secondary | ICD-10-CM | POA: Diagnosis not present

## 2021-06-17 DIAGNOSIS — E785 Hyperlipidemia, unspecified: Secondary | ICD-10-CM | POA: Diagnosis not present

## 2021-07-16 ENCOUNTER — Other Ambulatory Visit: Payer: Self-pay | Admitting: Cardiology

## 2021-07-16 DIAGNOSIS — R490 Dysphonia: Secondary | ICD-10-CM | POA: Diagnosis not present

## 2021-07-16 DIAGNOSIS — H8111 Benign paroxysmal vertigo, right ear: Secondary | ICD-10-CM | POA: Insufficient documentation

## 2021-07-16 DIAGNOSIS — R42 Dizziness and giddiness: Secondary | ICD-10-CM | POA: Diagnosis not present

## 2021-07-20 DIAGNOSIS — Z6831 Body mass index (BMI) 31.0-31.9, adult: Secondary | ICD-10-CM | POA: Diagnosis not present

## 2021-07-20 DIAGNOSIS — Z0001 Encounter for general adult medical examination with abnormal findings: Secondary | ICD-10-CM | POA: Diagnosis not present

## 2021-07-20 DIAGNOSIS — G894 Chronic pain syndrome: Secondary | ICD-10-CM | POA: Diagnosis not present

## 2021-07-20 DIAGNOSIS — Z9181 History of falling: Secondary | ICD-10-CM | POA: Diagnosis not present

## 2021-07-20 DIAGNOSIS — F330 Major depressive disorder, recurrent, mild: Secondary | ICD-10-CM | POA: Diagnosis not present

## 2021-08-17 ENCOUNTER — Telehealth: Payer: Self-pay | Admitting: Gastroenterology

## 2021-08-17 NOTE — Telephone Encounter (Signed)
Patient called stating she needs an authorization for her Dexilant for the namebrand and not the generic as the generic does not work for her.  She says she usually has to go through this every year to get it approved.  Please call patient and advise if there is an issue.  Thank you. ?

## 2021-08-17 NOTE — Telephone Encounter (Signed)
[employer] approved the brand name effective from 06-14-2021 to 06-13-2022 ?

## 2021-10-01 ENCOUNTER — Other Ambulatory Visit: Payer: Medicare HMO

## 2021-10-01 NOTE — Progress Notes (Signed)
 [facility]  [address] [place],  [place]  [address] [phone]  Clinic Day:  10/02/21  Referring physician: Lanier Rexene MATSU, NP  CHIEF COMPLAINT:  CC:  History of stage IIIB colon cancer with mild elevation of the CEA  Current Treatment:   Observation   HISTORY OF PRESENT ILLNESS:  [name] is a 66 y.o. 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  She was treated with surgical resection.  Pathology revealed a 3.6 centimeter, grade 2, adenocarcinoma with 3 of 45 nodes positive for metastasis.  She received adjuvant chemotherapy with FOLFOX for 5 cycles.  Oxaliplatin was then discontinued because of severe cytopenias.  She continued 5 fluorouracil and leucovorin for 4 more cycles, but then developed a severe allergic reaction to the 5-FU.  She was then placed on Xeloda for 6 weeks to complete a full 6 months of adjuvant chemotherapy.  She also had difficulty tolerating the Xeloda.  She has not had evidence of recurrence.  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  The patient is a nonsmoker.  She had a CT abdomen/pelvis in June 2016 for abdominal pain, but this was negative other than a small hiatal hernia.  MRI of the lumbar spine in July 2019 revealed left sided neural impingement at L1-2 with multilevel spondylosis. She had spinal surgery with Dr. Marlyce.  Her CEA was mildly elevated in April 2021 at 5.5. CT chest, abdomen and pelvis at that time did not reveal any evidence of malignancy. The CEA was 5 in July, then 5.2 in October. Colonoscopy with Dr. Charlanne in December 2021 was unremarkable. Repeat in 5 years was recommended.   The CEA went up to 7.5 in January 2022, and went down to 7.0 in February. CT chest, abdomen and pelvis  from February did not reveal any evidence of local recurrence of metastatic disease. There was a stable moderate to large hiatal hernia noted.  The CEA was down to 5.6 in May, then 5.2 in August. She states she has 40% coronary artery blockage, for which she is on aspirin  81 mg daily, metoprolol  25 mg twice daily, nitroglycerin  prn, and rosuvastatin  20 mg daily through Dr. Krasowski. CT chest, abdomen and pelvis from November 9th of 2022 revealed no findings to suggest metastatic disease. CEA went up to 5.9 in November of 2022.  INTERVAL HISTORY:  [name] is here for routine follow up and states that she has been well and denies complaints. She is seeing an ENT specialist at [facility] regarding her hoarseness. 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 Blood counts and chemistries are unremarkable. CEA was 4.5, the best it's been and first normal reading since January of 2022.previously 5.2 in August. She has never been a smoker. Her  appetite is good, and she has lost 3 1/2 pounds since her last visit.  She denies fever, chills or other signs of infection.  She denies nausea, vomiting, bowel issues, or abdominal pain.  She denies sore throat, cough, dyspnea, or chest pain.  REVIEW OF SYSTEMS:  Review of Systems  Constitutional: Negative.  Negative for appetite change, chills, fatigue, fever and unexpected weight change.  HENT:   Positive for voice change.  Eyes: Negative.   Respiratory: Negative.  Negative for chest tightness, cough, hemoptysis, shortness of breath and wheezing.   Cardiovascular: Negative.  Negative for chest pain, leg swelling and palpitations.  Gastrointestinal: Negative.  Negative for abdominal distention, abdominal pain, blood in stool, constipation, diarrhea, nausea and vomiting.  Endocrine: Negative.   Genitourinary: Negative.  Negative for difficulty urinating, dysuria, frequency and hematuria.   Musculoskeletal: Negative.  Negative for  arthralgias, back pain, flank pain, gait problem and myalgias.  Skin: Negative.   Neurological: Negative.  Negative for dizziness, extremity weakness, gait problem, headaches, light-headedness, numbness, seizures and speech difficulty.  Hematological: Negative.   Psychiatric/Behavioral: Negative.  Negative for depression and sleep disturbance. The patient is not nervous/anxious.     VITALS:  Blood pressure 137/81, pulse 60, temperature 98.1 F (36.7 C), temperature source Oral, resp. rate 18, height 5' 0.3 (1.532 m), weight 161 lb 6.4 oz (73.2 kg), SpO2 97 %.  Wt Readings from Last 3 Encounters:  10/02/21 161 lb 6.4 oz (73.2 kg)  05/19/21 165 lb 9.6 oz (75.1 kg)  05/05/21 165 lb 1.6 oz (74.9 kg)    Body mass index is 31.21 kg/m.  Performance status (ECOG): 0 - Asymptomatic  PHYSICAL EXAM:  Physical Exam Constitutional:      General: She is not in acute distress.    Appearance: Normal appearance. She is normal weight.  HENT:     Head: Normocephalic and atraumatic.  Eyes:     General: No scleral icterus.    Extraocular Movements: Extraocular movements intact.     Conjunctiva/sclera: Conjunctivae normal.     Pupils: Pupils are equal, round, and reactive to light.  Cardiovascular:     Rate and Rhythm: Normal rate and regular rhythm.     Pulses: Normal pulses.     Heart sounds: Normal heart sounds. No murmur heard.   No friction rub. No gallop.  Pulmonary:     Effort: Pulmonary effort is normal. No respiratory distress.     Breath sounds: Normal breath sounds.  Abdominal:     General: Bowel sounds are normal. There is no distension.     Palpations: Abdomen is soft. There is no hepatomegaly, splenomegaly or mass.     Tenderness: There is no abdominal tenderness.  Musculoskeletal:        General: Normal range of motion.     Cervical back: Normal range of motion and neck supple.     Right lower leg: No edema.     Left lower leg: No edema.  Lymphadenopathy:     Cervical: No  cervical adenopathy.  Skin:    General: Skin is warm and dry.  Neurological:     General: No focal deficit present.     Mental Status: She is alert and oriented to person, place, and time. Mental status is at baseline.  Psychiatric:        Mood and Affect: Mood normal.        Behavior: Behavior normal.        Thought Content: Thought content normal.        Judgment: Judgment normal.    LABS:      Latest Ref Rng & Units 10/02/2021   12:00 AM 05/04/2021   12:00 AM 02/02/2021   12:00 AM  CBC  WBC  6.0      5.3   5.8    Hemoglobin 12.0 - 16.0 12.5      13.1   13.2    Hematocrit 36 - 46  39      40   40    Platelets 150 - 400 K/uL 171      139   134       This result is from an external source.      Latest Ref Rng & Units 10/02/2021   12:00 AM 05/04/2021   12:00 AM 02/02/2021   12:00 AM  CMP  BUN 4 - 21 8      4   13     Creatinine 0.5 - 1.1 0.9      1.1   1.0    Sodium 137 - 147 136      140   137    Potassium 3.5 - 5.1 mEq/L 4.3      4.2   4.3    Chloride 99 - 108 104      106   106    CO2 13 - 22 22      27   22     Calcium  8.7 - 10.7 9.6      9.2   9.4    Alkaline Phos 25 - 125 85      99   79    AST 13 - 35 33      32   34    ALT 7 - 35 U/L 19      17   19        This result is from an external source.     Lab Results  Component Value Date   CEA1 4.5 10/02/2021   /  CEA  Date Value Ref Range Status  10/02/2021 4.5 0.0 - 4.7 ng/mL Final    Comment:    (NOTE)                             Nonsmokers          <3.9                             Smokers             <5.6 Roche [employer] Electrochemiluminescence Immunoassay (ECLIA) Values obtained with different assay methods or kits cannot be used interchangeably.  Results cannot be interpreted as absolute evidence of the presence or absence of malignant disease. Performed At: [facility] [address] [place], [place] [id number] Jennette Shorter MD Ey:[id number]     STUDIES:   EXAM:  02/26/2021 DIGITAL SCREENING BILATERAL MAMMOGRAM WITH TOMOSYNTHESIS AND CAD  TECHNIQUE: Bilateral screening digital craniocaudal and mediolateral oblique mammograms were obtained. Bilateral screening digital breast tomosynthesis was performed. The images were evaluated with computer-aided detection.  COMPARISON: Previous exam(s).  ACR Breast Density Category c: The breast tissue is heterogeneously dense, which may obscure small masses.  FINDINGS: There are no findings suspicious for malignancy.  IMPRESSION: No mammographic evidence of malignancy. A result letter of this screening mammogram will be mailed directly to the patient.   EXAM: 04/22/2021 CT CHEST, ABDOMEN, AND PELVIS WITH CONTRAST  TECHNIQUE: Multidetector CT imaging of the chest, abdomen and pelvis was performed following the standard protocol during bolus administration of intravenous contrast.  CONTRAST: 100 mL of Isovue 370  COMPARISON: CT the chest, abdomen and pelvis 07/23/2020.  FINDINGS: CT CHEST FINDINGS Cardiovascular: Heart size is mildly enlarged with left atrial dilatation. There is no significant pericardial fluid, thickening or pericardial calcification. There is aortic atherosclerosis, as well as  atherosclerosis of the great vessels of the mediastinum and the coronary arteries, including calcified atherosclerotic plaque in the left anterior descending coronary artery. Mediastinum/Nodes: No pathologically enlarged mediastinal or hilar lymph nodes. Large hiatal hernia. No axillary lymphadenopathy. Lungs/Pleura: No suspicious appearing pulmonary nodules or masses are noted. No acute consolidative airspace disease. No pleural effusions. Musculoskeletal: There are no aggressive appearing lytic or blastic lesions noted in the visualized portions of the skeleton.  CT ABDOMEN PELVIS FINDINGS Hepatobiliary: 1.4 x 1.2 cm low-attenuation lesion between segments 5 and 6 in the liver, similar to prior  study, compatible with a benign cyst. No other suspicious appearing hepatic lesions. No intra or extrahepatic biliary ductal dilatation. Gallbladder is normal in appearance. Pancreas: No pancreatic mass. No pancreatic ductal dilatation. No pancreatic or peripancreatic fluid collections or inflammatory changes. Spleen: Unremarkable. Adrenals/Urinary Tract: Bilateral kidneys and adrenal glands are normal in appearance. No hydroureteronephrosis. Urinary bladder is normal in appearance. Stomach/Bowel: Intra-abdominal portion of the stomach is unremarkable. No pathologic dilatation of small bowel or colon. Several colonic diverticulae are noted, particularly in the distal descending colon and proximal sigmoid colon, without surrounding inflammatory changes to suggest an acute diverticulitis at this time. Postoperative changes of prior partial colectomy are noted in the right side of the colon. Vascular/Lymphatic: Aortic atherosclerosis, without evidence of aneurysm or dissection in the abdominal or pelvic vasculature. No lymphadenopathy noted in the abdomen or pelvis. Reproductive: Status post hysterectomy. Ovaries are not confidently identified may be surgically absent or atrophic. Other: No significant volume of ascites. No pneumoperitoneum. Musculoskeletal: Status post left-sided PLIF at L4-L5 with interbody cage at L4-L5 interspace. There are no aggressive appearing lytic or blastic lesions noted in the visualized portions of the skeleton.  IMPRESSION: 1. No findings to suggest metastatic disease in the chest, abdomen or pelvis. 2. No acute findings to account for the patient's history of left upper quadrant abdominal pain. 3. Mild colonic diverticulosis without evidence of acute diverticulitis at this time. 4. Large hiatal hernia. 5. Aortic atherosclerosis. 6. Cardiomegaly with left atrial dilatation. 7. Additional incidental findings, as above.  HISTORY:   Allergies:   Allergies  Allergen Reactions   Penicillins Rash    Has patient had a PCN reaction causing immediate rash, facial/tongue/throat swelling, SOB or lightheadedness with hypotension: Yes Has patient had a PCN reaction causing severe rash involving mucus membranes or skin necrosis: No Has patient had a PCN reaction that required hospitalization: No Has patient had a PCN reaction occurring within the last 10 years: No If all of the above answers are NO, then may proceed with Cephalosporin use.    Codeine Other (See Comments)    Dizziness and lethargy    Aciphex  [Rabeprazole ] Rash   Guaifenesin & Derivatives Rash   Latex Rash    Usually from adhesive tapes   Other Rash    Tape cannot be on the skin for an extended period of time   Pseudoephedrine Rash   Sulfa Antibiotics Rash   Sulfasalazine Rash   Tape Rash    Cannot be on the skin for an extended period of time    Current Medications: Current Outpatient Medications  Medication Sig Dispense Refill   aspirin  EC 81 MG tablet Take 81 mg by mouth daily.     B Complex-Folic Acid (B COMPLEX-VITAMIN B12 PO) Take 1 tablet by mouth daily. Unknown strength     CALCIUM  PO Take 2 tablets by mouth daily. Unknown strength     Cholecalciferol (VITAMIN D3) 2000 units TABS Take  2,000 Units by mouth daily.     CINNAMON PO Take 1 tablet by mouth daily. Unknown strength     dexlansoprazole  (DEXILANT ) 60 MG capsule Take 1 capsule (60 mg total) by mouth daily. 90 capsule 4   diltiazem  (CARDIZEM  CD) 120 MG 24 hr capsule Take 1 capsule (120 mg total) by mouth daily. 90 capsule 1   DULoxetine  (CYMBALTA ) 60 MG capsule Take 60 mg by mouth daily.  1   famotidine  (PEPCID ) 40 MG tablet Take 1 tablet (40 mg total) by mouth at bedtime. 90 tablet 2   gabapentin (NEURONTIN) 800 MG tablet Take 800 mg by mouth 3 (three) times daily.     meloxicam (MOBIC) 15 MG tablet Take 15 mg by mouth daily.     metoprolol  succinate (TOPROL -XL) 25 MG 24 hr tablet Take 25 mg by  mouth 2 (two) times daily.     Multiple Vitamins-Minerals (CENTRUM SILVER 50+WOMEN) TABS Take 1 tablet by mouth daily. Unknown strength     nitroGLYCERIN  (NITROSTAT ) 0.4 MG SL tablet Place 0.4 mg under the tongue every 5 (five) minutes x 3 doses as needed for chest pain.      RABEprazole  (ACIPHEX ) 20 MG tablet SMARTSIG:1 Tablet(s) By Mouth Morning-Night     ranolazine  (RANEXA ) 500 MG 12 hr tablet Take 1 tablet by mouth twice daily 180 tablet 2   rosuvastatin  (CRESTOR ) 20 MG tablet Take 20 mg by mouth at bedtime.     Current Facility-Administered Medications  Medication Dose Route Frequency Provider Last Rate Last Admin   0.9 %  sodium chloride  infusion  500 mL Intravenous Once Charlanne Groom, MD         ASSESSMENT & PLAN:   Assessment:   1. History of stage IIIB colon cancer diagnosed in December 2012.  The patient remains without obvious evidence of recurrence.  Colonoscopy in December 2021 was unremarkable and she will be due repeat examination in 2026.  2. Mild elevation of the CEA of uncertain etiology.  CT imaging in February and November 2022 did not reveal any evidence of malignancy.  CEA is now in the normal range.   3. 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 Marlyce.  Plan:   We will see her back in 4-6 months with a CBC, comprehensive metabolic panel and CEA for continued close follow-up due to the elevated CEA. If her annual mammogram has not been scheduled for her by then, I can order it.  The patient understands the plans discussed today and is in agreement with them.  She knows to contact our office if she develops concerns prior to her next appointment.  I provided 15 minutes of face-to-face time during this this encounter and > 50% was spent counseling as documented under my assessment and plan.

## 2021-10-02 ENCOUNTER — Other Ambulatory Visit: Payer: Self-pay | Admitting: Oncology

## 2021-10-02 ENCOUNTER — Inpatient Hospital Stay: Payer: Medicare HMO

## 2021-10-02 ENCOUNTER — Encounter: Payer: Self-pay | Admitting: Oncology

## 2021-10-02 ENCOUNTER — Inpatient Hospital Stay: Payer: Medicare HMO | Attending: Oncology | Admitting: Oncology

## 2021-10-02 ENCOUNTER — Other Ambulatory Visit: Payer: Self-pay | Admitting: Hematology and Oncology

## 2021-10-02 VITALS — BP 137/81 | HR 60 | Temp 98.1°F | Resp 18 | Ht 60.3 in | Wt 161.4 lb

## 2021-10-02 DIAGNOSIS — C182 Malignant neoplasm of ascending colon: Secondary | ICD-10-CM

## 2021-10-02 DIAGNOSIS — F419 Anxiety disorder, unspecified: Secondary | ICD-10-CM | POA: Diagnosis not present

## 2021-10-02 DIAGNOSIS — F418 Other specified anxiety disorders: Secondary | ICD-10-CM | POA: Diagnosis not present

## 2021-10-02 DIAGNOSIS — Z85038 Personal history of other malignant neoplasm of large intestine: Secondary | ICD-10-CM | POA: Insufficient documentation

## 2021-10-02 DIAGNOSIS — R970 Elevated carcinoembryonic antigen [CEA]: Secondary | ICD-10-CM | POA: Insufficient documentation

## 2021-10-02 DIAGNOSIS — D649 Anemia, unspecified: Secondary | ICD-10-CM | POA: Diagnosis not present

## 2021-10-02 LAB — HEPATIC FUNCTION PANEL
ALT: 19 U/L (ref 7–35)
AST: 33 (ref 13–35)
Alkaline Phosphatase: 85 (ref 25–125)
Bilirubin, Total: 0.6

## 2021-10-02 LAB — CBC
Absolute Lymphocytes: 1.32 (ref 0.65–4.75)
MCV: 92 (ref 81–99)
RBC: 4.25 (ref 3.87–5.11)

## 2021-10-02 LAB — BASIC METABOLIC PANEL
BUN: 8 (ref 4–21)
CO2: 22 (ref 13–22)
Chloride: 104 (ref 99–108)
Creatinine: 0.9 (ref 0.5–1.1)
Glucose: 96
Potassium: 4.3 mEq/L (ref 3.5–5.1)
Sodium: 136 — AB (ref 137–147)

## 2021-10-02 LAB — CBC AND DIFFERENTIAL
HCT: 39 (ref 36–46)
Hemoglobin: 12.5 (ref 12.0–16.0)
Neutrophils Absolute: 3.6
Platelets: 171 10*3/uL (ref 150–400)
WBC: 6

## 2021-10-02 LAB — COMPREHENSIVE METABOLIC PANEL
Albumin: 4 (ref 3.5–5.0)
Calcium: 9.6 (ref 8.7–10.7)

## 2021-10-03 LAB — CEA: CEA: 4.5 ng/mL (ref 0.0–4.7)

## 2021-10-06 ENCOUNTER — Telehealth: Payer: Self-pay

## 2021-10-06 NOTE — Telephone Encounter (Addendum)
Pt notified of below. She states, "Praise God". ? ?----- Message from Derwood Kaplan, MD sent at 10/04/2021  6:06 PM EDT ----- ?Regarding: call ?Tell her CEA is better, in normal range at 4.5 ? ?

## 2021-11-01 ENCOUNTER — Other Ambulatory Visit: Payer: Self-pay | Admitting: Gastroenterology

## 2021-11-02 ENCOUNTER — Telehealth: Payer: Self-pay | Admitting: Gastroenterology

## 2021-11-02 NOTE — Telephone Encounter (Signed)
Refill sent. Patient needs to schedule a visit for more refills

## 2021-11-02 NOTE — Telephone Encounter (Signed)
Inbound call from patient stating she is in need of a refill for Pepcid. Please advise.

## 2021-11-19 ENCOUNTER — Other Ambulatory Visit: Payer: Self-pay | Admitting: Cardiology

## 2021-11-23 ENCOUNTER — Ambulatory Visit: Payer: Medicare HMO | Admitting: Cardiology

## 2021-11-24 DIAGNOSIS — J3801 Paralysis of vocal cords and larynx, unilateral: Secondary | ICD-10-CM | POA: Diagnosis not present

## 2021-11-24 DIAGNOSIS — R490 Dysphonia: Secondary | ICD-10-CM | POA: Diagnosis not present

## 2021-11-24 DIAGNOSIS — J3800 Paralysis of vocal cords and larynx, unspecified: Secondary | ICD-10-CM | POA: Diagnosis not present

## 2021-11-24 DIAGNOSIS — Z888 Allergy status to other drugs, medicaments and biological substances status: Secondary | ICD-10-CM | POA: Diagnosis not present

## 2021-11-24 DIAGNOSIS — Z880 Allergy status to penicillin: Secondary | ICD-10-CM | POA: Diagnosis not present

## 2021-11-24 DIAGNOSIS — Z885 Allergy status to narcotic agent status: Secondary | ICD-10-CM | POA: Diagnosis not present

## 2021-11-24 DIAGNOSIS — Z882 Allergy status to sulfonamides status: Secondary | ICD-10-CM | POA: Diagnosis not present

## 2021-11-24 DIAGNOSIS — Z91040 Latex allergy status: Secondary | ICD-10-CM | POA: Diagnosis not present

## 2021-11-24 DIAGNOSIS — J385 Laryngeal spasm: Secondary | ICD-10-CM | POA: Diagnosis not present

## 2021-12-03 ENCOUNTER — Telehealth: Payer: Self-pay | Admitting: Cardiology

## 2021-12-03 DIAGNOSIS — R609 Edema, unspecified: Secondary | ICD-10-CM

## 2021-12-03 NOTE — Telephone Encounter (Signed)
Spoke with pt. She reported swelling in her feet and ankles.Started about a month ago. Resolves with elevation and overnight. No weights and no blood pressures to report. No Shortness of breath and no Chest pain. She is not on a diuretic. Please advise.

## 2021-12-03 NOTE — Telephone Encounter (Signed)
Pt c/o swelling: STAT is pt has developed SOB within 24 hours  If swelling, where is the swelling located? Feet, legs, ankles  How much weight have you gained and in what time span? Not sure  Have you gained 3 pounds in a day or 5 pounds in a week? Not sure  Do you have a log of your daily weights (if so, list)? no  Are you currently taking a fluid pill? No   Are you currently SOB? no  Have you traveled recently? No   Patient is concerned about the swelling in her feet/ankles/legs. She said it just started about a month ago and she has never had a problem until now

## 2021-12-04 MED ORDER — FUROSEMIDE 20 MG PO TABS: 20.0000 mg | ORAL_TABLET | Freq: Every day | ORAL | 3 refills | Status: DC

## 2021-12-04 NOTE — Telephone Encounter (Signed)
 Spoke with pt regarding Dr. Karry reply to her message. She agreed and verbalized understanding. She had no further questions. Sent Lasix  to pharmacy and refilled Nitroglycerin  per pt request.

## 2021-12-08 DIAGNOSIS — R609 Edema, unspecified: Secondary | ICD-10-CM | POA: Diagnosis not present

## 2021-12-09 ENCOUNTER — Telehealth: Payer: Self-pay | Admitting: Cardiology

## 2021-12-09 LAB — BASIC METABOLIC PANEL
BUN/Creatinine Ratio: 11 — ABNORMAL LOW (ref 12–28)
BUN: 13 mg/dL (ref 8–27)
CO2: 21 mmol/L (ref 20–29)
Calcium: 9.7 mg/dL (ref 8.7–10.3)
Chloride: 101 mmol/L (ref 96–106)
Creatinine, Ser: 1.2 mg/dL — ABNORMAL HIGH (ref 0.57–1.00)
Glucose: 123 mg/dL — ABNORMAL HIGH (ref 70–99)
Potassium: 4 mmol/L (ref 3.5–5.2)
Sodium: 138 mmol/L (ref 134–144)
eGFR: 49 mL/min/{1.73_m2} — ABNORMAL LOW (ref >59)

## 2021-12-09 NOTE — Telephone Encounter (Signed)
 *  STAT* If patient is at the pharmacy, call can be transferred to refill team.   1. Which medications need to be refilled? (please list name of each medication and dose if known) nitroGLYCERIN (NITROSTAT) 0.4 MG SL tablet  2. Which pharmacy/location (including street and city if local pharmacy) is medication to be sent to? [address], [place]  3. Do they need a 30 day or 90 day supply? 1 bottle

## 2021-12-10 DIAGNOSIS — J385 Laryngeal spasm: Secondary | ICD-10-CM | POA: Diagnosis not present

## 2021-12-10 DIAGNOSIS — R490 Dysphonia: Secondary | ICD-10-CM | POA: Diagnosis not present

## 2021-12-11 MED ORDER — NITROGLYCERIN 0.4 MG SL SUBL: 0.4000 mg | SUBLINGUAL_TABLET | SUBLINGUAL | 2 refills | Status: AC | PRN

## 2021-12-11 MED ORDER — DEXLANSOPRAZOLE 60 MG PO CPDR: 1.0000 | DELAYED_RELEASE_CAPSULE | Freq: Every day | ORAL | 0 refills | Status: DC

## 2021-12-11 NOTE — Telephone Encounter (Signed)
Inbound call from patient needing a prescription refill for Dexilant 60 MG. Please give patient a call back to advise.  Thank you

## 2021-12-11 NOTE — Telephone Encounter (Signed)
Dexilant refilled as patient requested as she made an August appointment.

## 2021-12-11 NOTE — Addendum Note (Signed)
Addended by: [place], Jermani Eberlein E on: 12/11/2021 01:47 PM   Modules accepted: Orders

## 2021-12-17 ENCOUNTER — Telehealth: Payer: Self-pay | Admitting: Cardiology

## 2021-12-17 ENCOUNTER — Telehealth: Payer: Self-pay

## 2021-12-17 NOTE — Telephone Encounter (Signed)
Pt is calling to inquire about her labs done on 06/27. Requesting a call back.

## 2021-12-17 NOTE — Telephone Encounter (Signed)
Results reviewed with pt as per Dr. Krasowski's note.  Pt verbalized understanding and had no additional questions. Routed to PCP  

## 2021-12-23 DIAGNOSIS — J385 Laryngeal spasm: Secondary | ICD-10-CM | POA: Diagnosis not present

## 2021-12-23 DIAGNOSIS — R490 Dysphonia: Secondary | ICD-10-CM | POA: Diagnosis not present

## 2022-01-06 DIAGNOSIS — J385 Laryngeal spasm: Secondary | ICD-10-CM | POA: Diagnosis not present

## 2022-01-06 DIAGNOSIS — R490 Dysphonia: Secondary | ICD-10-CM | POA: Diagnosis not present

## 2022-01-15 DIAGNOSIS — Z01818 Encounter for other preprocedural examination: Secondary | ICD-10-CM | POA: Diagnosis not present

## 2022-01-15 DIAGNOSIS — H2512 Age-related nuclear cataract, left eye: Secondary | ICD-10-CM | POA: Diagnosis not present

## 2022-01-20 ENCOUNTER — Encounter: Payer: Self-pay | Admitting: Cardiology

## 2022-01-20 ENCOUNTER — Ambulatory Visit: Payer: Medicare HMO | Admitting: Cardiology

## 2022-01-20 VITALS — BP 124/76 | HR 84 | Ht 60.0 in | Wt 161.2 lb

## 2022-01-20 DIAGNOSIS — E782 Mixed hyperlipidemia: Secondary | ICD-10-CM | POA: Diagnosis not present

## 2022-01-20 DIAGNOSIS — I2510 Atherosclerotic heart disease of native coronary artery without angina pectoris: Secondary | ICD-10-CM

## 2022-01-20 DIAGNOSIS — R0789 Other chest pain: Secondary | ICD-10-CM | POA: Diagnosis not present

## 2022-01-20 DIAGNOSIS — I7121 Aneurysm of the ascending aorta, without rupture: Secondary | ICD-10-CM | POA: Diagnosis not present

## 2022-01-20 NOTE — Progress Notes (Signed)
Cardiology Office Note:    Date:  01/20/2022   ID:  [name], DOB [birth year], MRN [id number]  PCP:  Megan Hickman, FNP  Cardiologist:  Megan Campus, MD    Referring MD: Megan Hickman, FNP   Chief Complaint  Patient presents with   Leg Swelling    History of Present Illness:    [name] is a 66 y.o. female   with past medical history significant for chronic back problem that eventually required surgery.  She still suffering from that.  She did have a cardiac catheterization 2000 with some 40% blockages then cardiac catheterization repeated 2018 showing no significant abnormalities.  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 She was referred to [place] for 2 concerns 1 was sinus bradycardia that being addressed with discontinuation of beta-blocker as well as monitor which did not show any critical arrhythmia, also atypical chest pain that being addressed with stress testing which showed no evidence of ischemia.  She comes today to my office to discuss results of her tests.  Her monitor shows some episode of supraventricular tachycardia, short lasting and not symptomatic.  She was before beta-blocker but that being discontinued because of bradycardia.   She comes today to my office for follow-up concern is swelling of lower extremities to manage her supraventricular tachycardia we elected to go with diltiazem.  I suspect swelling of lower extremities may be worse because of that medication.  Past Medical History:  Diagnosis Date   Adjustment disorder    Anesthesia complication 3/57/0177   Overview:  "It takes me a while to wake up."  Formatting of this note might be different from the original. "It takes me a while to wake up."   Anxiety 01/09/2018   Anxiety disorder    Arthritis    hands, back   Ascending aortic aneurysm ([place]) 38 mm based on echocardiogram from  2021 05/29/2020   Atypical chest pain 12/19/2016   Borderline diabetes 01/09/2018   CAD (coronary artery disease)    Cancer ([place]) 01/09/2018   Overview:  colon  Formatting of this note might be different from the original. colon   Cataract    removed by surgery   Chest pain    Colon cancer ([place]) 2012   Depression    Depression 01/09/2018   Dysphonia    Dysthymic disorder    Fatty liver 01/09/2018   Fusion of lumbar spine 08/21/2018   Gastroesophageal reflux disease    Hiatal hernia with GERD 01/09/2018   History of kidney stones 01/09/2018   Hyperlipidemia    Insomnia    Kidney stones    removed by surgery   MRSA (methicillin resistant Staphylococcus aureus)    MRSA infection 01/09/2018   Overview:  states winter of 2016, treated at [facility] by Lucita Lora NP  Formatting of this note might be different from the original. states winter of 2016, treated at [facility] by Lucita Lora NP   Neuropathy    Obesity    Palpitations 01/20/2017   Pneumonia    Pre-syncope    Sinus bradycardia 02/10/2021   Sleep apnea    Varicose veins of bilateral lower extremities with other complications 93/90/3009    Past Surgical History:  Procedure Laterality Date   ABDOMINAL HYSTERECTOMY     BACK SURGERY     BLADDER SURGERY     CATARACT EXTRACTION, BILATERAL  COLON SURGERY  2012   Due to colon cancer stage 3   COLONOSCOPY  04/09/2015   Colonic polyp status post polypectomy. Mild pancolonic diverticulosis/ History of colon cancer status post right hemicolectomy. No evidence of recurrence    ESOPHAGOGASTRODUODENOSCOPY  05/26/2017   Schatzkis ring statust post esophageal dilatation. Hiatal hernia.    PORT-A-CATH REMOVAL     removal kidney stones     TUBAL LIGATION      Current Medications: Current Meds  Medication Sig   aspirin EC 81 MG tablet Take 81 mg by mouth daily.   B Complex-Folic Acid (B COMPLEX-VITAMIN B12 PO) Take 1 tablet by mouth daily. Unknown strength   CALCIUM  PO Take 2 tablets by mouth daily. Unknown strength   Cholecalciferol (VITAMIN D3) 2000 units TABS Take 2,000 Units by mouth daily.   CINNAMON PO Take 1 tablet by mouth daily. Unknown strength   dexlansoprazole (DEXILANT) 60 MG capsule Take 1 capsule (60 mg total) by mouth daily.   diltiazem (CARDIZEM CD) 120 MG 24 hr capsule Take 1 capsule by mouth once daily (Patient taking differently: Take 120 mg by mouth daily.)   DULoxetine (CYMBALTA) 60 MG capsule Take 60 mg by mouth daily.   famotidine (PEPCID) 40 MG tablet Take 1 tablet (40 mg total) by mouth at bedtime. Please call [phone] to schedule an office visit for more refills   furosemide (LASIX) 20 MG tablet Take 1 tablet (20 mg total) by mouth daily.   gabapentin (NEURONTIN) 800 MG tablet Take 800 mg by mouth 3 (three) times daily.   meloxicam (MOBIC) 15 MG tablet Take 15 mg by mouth daily.   metoprolol succinate (TOPROL-XL) 25 MG 24 hr tablet Take 25 mg by mouth 2 (two) times daily.   Multiple Vitamins-Minerals (CENTRUM SILVER 50+WOMEN) TABS Take 1 tablet by mouth daily. Unknown strength   nitroGLYCERIN (NITROSTAT) 0.4 MG SL tablet Place 1 tablet (0.4 mg total) under the tongue every 5 (five) minutes x 3 doses as needed for chest pain.   RABEprazole (ACIPHEX) 20 MG tablet Take 20 mg by mouth daily.   ranolazine (RANEXA) 500 MG 12 hr tablet Take 1 tablet by mouth twice daily   rosuvastatin (CRESTOR) 20 MG tablet Take 20 mg by mouth at bedtime.   Current Facility-Administered Medications for the 01/20/22 encounter (Office Visit) with Park Liter, MD  Medication   0.9 %  sodium chloride infusion     Allergies:   Penicillins, Codeine, Aciphex [rabeprazole], Guaifenesin & derivatives, Latex, Other, Pseudoephedrine, Sulfa antibiotics, Sulfasalazine, and Tape   Social History   Socioeconomic History   Marital status: Widowed    Spouse name: Not on file   Number of children: Not on file   Years of education: Not on file   Highest  education level: Not on file  Occupational History   Not on file  Tobacco Use   Smoking status: Never   Smokeless tobacco: Never  Vaping Use   Vaping Use: Never used  Substance and Sexual Activity   Alcohol use: No   Drug use: No   Sexual activity: Not Currently    Birth control/protection: Post-menopausal  Other Topics Concern   Not on file  Social History Narrative   Not on file   Social Determinants of Health   Financial Resource Strain: Not on file  Food Insecurity: Not on file  Transportation Needs: Not on file  Physical Activity: Not on file  Stress: Not on file  Social Connections: Not on  file     Family History: The patient's family history includes CAD in her mother; COPD in her father; CVA in her brother; Heart failure in her father; Hypertension in her mother; Kidney disease in her mother. There is no history of Colon cancer, Rectal cancer, or Stomach cancer. ROS:   Please see the history of present illness.    All 14 point review of systems negative except as described per history of present illness  EKGs/Labs/Other Studies Reviewed:      Recent Labs: 10/02/2021: ALT 19; Hemoglobin 12.5; Platelets 171 12/08/2021: BUN 13; Creatinine, Ser 1.20; Potassium 4.0; Sodium 138  Recent Lipid Panel No results found for: "CHOL", "TRIG", "HDL", "CHOLHDL", "VLDL", "LDLCALC", "LDLDIRECT"  Physical Exam:    VS:  BP 124/76 (BP Location: Left Arm, Patient Position: Sitting)   Pulse 84   Ht 5' (1.524 m)   Wt 161 lb 3.2 oz (73.1 kg)   LMP  (LMP Unknown)   SpO2 96%   BMI 31.48 kg/m     Wt Readings from Last 3 Encounters:  01/20/22 161 lb 3.2 oz (73.1 kg)  10/02/21 161 lb 6.4 oz (73.2 kg)  05/19/21 165 lb 9.6 oz (75.1 kg)     GEN:  Well nourished, well developed in no acute distress HEENT: Normal NECK: No JVD; No carotid bruits LYMPHATICS: No lymphadenopathy CARDIAC: RRR, no murmurs, no rubs, no gallops RESPIRATORY:  Clear to auscultation without rales, wheezing  or rhonchi  ABDOMEN: Soft, non-tender, non-distended MUSCULOSKELETAL:  No edema; No deformity  SKIN: Warm and dry LOWER EXTREMITIES: no swelling NEUROLOGIC:  Alert and oriented x 3 PSYCHIATRIC:  Normal affect   ASSESSMENT:    1. Aneurysm of ascending aorta without rupture ([place])   2. Coronary artery disease involving native coronary artery of native heart without angina pectoris   3. Atypical chest pain   4. Mixed hyperlipidemia    PLAN:    In order of problems listed above:  Arteries of the ascending aorta only 38 mm.  Within next few years we will repeat visualization study. Coronary disease stable without any issues right now we will continue present management Atypical chest pain denies having any Dyslipidemia, I do not see any recent fasting lipid profile make arrangements for the test. Swelling of lower extremities.  Only mild I suspect that the reason is diltiazem I gave her 3 options simply continuation of present medications and present management, discontinuation of Cardizem and see how she does from arrhythmia point review on swelling, I will slightly increase dose of diuretics.  He said she prefers simply watch the situation.  She will let us know if situation gets worse and she needs our attention. Left arm tremor: We had a discussion about this she is concerned to have sometimes have difficulty drinking water I wanted to refer her to neurology to assessment of the tremor however she prefers to wait now.   Medication Adjustments/Labs and Tests Ordered: Current medicines are reviewed at length with the patient today.  Concerns regarding medicines are outlined above.  No orders of the defined types were placed in this encounter.  Medication changes: No orders of the defined types were placed in this encounter.   Signed, Park Liter, MD, [facility] 01/20/2022 4:32 PM    [place]

## 2022-01-20 NOTE — Patient Instructions (Signed)
Medication Instructions:  Your physician recommends that you continue on your current medications as directed. Please refer to the Current Medication list given to you today.  *If you need a refill on your cardiac medications before your next appointment, please call your pharmacy*   Lab Work: NONE If you have labs (blood work) drawn today and your tests are completely normal, you will receive your results only by: MyChart Message (if you have MyChart) OR A paper copy in the mail If you have any lab test that is abnormal or we need to change your treatment, we will call you to review the results.   Testing/Procedures: NONE   Follow-Up: At [facility], you and your health needs are our priority.  As part of our continuing mission to provide you with exceptional heart care, we have created designated Provider Care Teams.  These Care Teams include your primary Cardiologist (physician) and Advanced Practice Providers (APPs -  Physician Assistants and Nurse Practitioners) who all work together to provide you with the care you need, when you need it.  We recommend signing up for the patient portal called "MyChart".  Sign up information is provided on this After Visit Summary.  MyChart is used to connect with patients for Virtual Visits (Telemedicine).  Patients are able to view lab/test results, encounter notes, upcoming appointments, etc.  Non-urgent messages can be sent to your provider as well.   To learn more about what you can do with MyChart, go to [email or web address].    Your next appointment:   1 year(s)  The format for your next appointment:   In Person  Provider:   Robert Krasowski, MD    Other Instructions   Important Information About Sugar       

## 2022-01-26 DIAGNOSIS — H43392 Other vitreous opacities, left eye: Secondary | ICD-10-CM | POA: Diagnosis not present

## 2022-01-26 HISTORY — PX: EYE SURGERY: SHX253

## 2022-01-28 ENCOUNTER — Encounter: Payer: Self-pay | Admitting: Gastroenterology

## 2022-01-28 ENCOUNTER — Ambulatory Visit: Payer: Medicare HMO | Admitting: Gastroenterology

## 2022-01-28 VITALS — BP 120/80 | HR 60 | Ht 59.5 in | Wt 151.2 lb

## 2022-01-28 DIAGNOSIS — R1319 Other dysphagia: Secondary | ICD-10-CM | POA: Diagnosis not present

## 2022-01-28 DIAGNOSIS — K449 Diaphragmatic hernia without obstruction or gangrene: Secondary | ICD-10-CM | POA: Diagnosis not present

## 2022-01-28 DIAGNOSIS — K219 Gastro-esophageal reflux disease without esophagitis: Secondary | ICD-10-CM

## 2022-01-28 MED ORDER — FAMOTIDINE 20 MG PO TABS: 20.0000 mg | ORAL_TABLET | Freq: Every day | ORAL | 4 refills | Status: DC

## 2022-01-28 MED ORDER — DEXLANSOPRAZOLE 60 MG PO CPDR: 1.0000 | DELAYED_RELEASE_CAPSULE | Freq: Every day | ORAL | 4 refills | Status: DC

## 2022-01-28 NOTE — Progress Notes (Signed)
Chief Complaint: FU  Referring Provider:  Madison Hickman, FNP      ASSESSMENT AND PLAN;   #1. GERD with large HH with LPR/hoarsenss. Now with eso dysphagia. H/O Schatzki's ring s/p eso dil 2018.  #2.  H/O colon cancer (stage III, [id number]) s/p R hemicolectomy 2012.  H/O colonic polyps 03/2015.  Neg colon 05/2020, neg CT C/A/P Nov 2022.  Plan:  - EGD with eso dilatation. - Dexilant '60mg'$  po qd #90, 4 RF - Pepcid '20mg'$  po qhs. #90, 4 RF - Senna 4/day - Fleets ennema x 2. One tinight and one in AM - Miralax 17 g BID - Drink water. - Instructed patient to chew foods especially meats and breads well and eat slowly.  HPI:    [name] is a 66 y.o. female  For follow-up  S/P left eye Sx for floaters recently.  Still has eye shield on.  Also seen by ENT- neg eval except for ? VC nodule.  Awaiting biopsy.   Reflux is under good control with Dexilant in a.m. and Pepcid at bedtime.  She felt better after dilation.  Occasional dysphagia but not as bad.  She would like to have another dilation.  Also complains of constipation with no Bms x 2 weeks Took senna 2 previously with some relief. Complains of abdominal discomfort and bloating.  Most recently her CT chest/Abdo/pelvis November 2022 was unremarkable.  Her CEA level is down as compared to the previous level.  Her colonoscopy December 2021 did not show any recurrence.  No weight loss.  No melena or hematochezia.   Past GI procedures:  EGD 12/02/2020 - Schatzki ring. Biopsied. Dilated with 54Fr - 8 cm hiatal hernia with Cameron erosions. Biopsied. - Normal examined duodenum.  Colonoscopy 05/14/2020 -Pancolonic diverticulosis. -S/P right hemicolectomy. -Few erosions and neo-TI ?Importance (bx- mild inflammation.  No granulomas. -Non-bleeding internal hemorrhoids. -The examination was otherwise normal to anastomosis. No recurrence. -Repeat in 5 years.  CT chest/Abdo/pelvis 04/22/2021 -No evidence of  metastatic disease in chest, Abdo/pelvis -Large hiatal hernia -Mild sigmoid diverticulosis.  -EGD 05/26/2017 8 cm HH with small paraesophageal component, Schatzki ring s/p dil 52 Fr -Colonoscopy 04/09/2015 ([facility])-6 mm sessile polyp s/p polypectomy (Bx- TA), R hemi, mild pancolonic div. -CT chest Abdo/pelvis with p.o. and IV contrast 07/2020: No recurrence, moderate to large hiatal hernia, atherosclerosis. Past Medical History:  Diagnosis Date   Adjustment disorder    Anesthesia complication 2/44/0102   Overview:  "It takes me a while to wake up."  Formatting of this note might be different from the original. "It takes me a while to wake up."   Anxiety 01/09/2018   Anxiety disorder    Arthritis    hands, back   Ascending aortic aneurysm ([place]) 38 mm based on echocardiogram from 2021 05/29/2020   Atypical chest pain 12/19/2016   Borderline diabetes 01/09/2018   CAD (coronary artery disease)    Cancer ([place]) 01/09/2018   Overview:  colon  Formatting of this note might be different from the original. colon   Cataract    removed by surgery   Chest pain    Colon cancer ([place]) 2012   Depression    Depression 01/09/2018   Dysphonia    Dysthymic disorder    Fatty liver 01/09/2018   Fusion of lumbar spine 08/21/2018   Gastroesophageal reflux disease    Hiatal hernia with GERD 01/09/2018   History of kidney stones 01/09/2018   Hyperlipidemia    Insomnia  Kidney stones    removed by surgery   MRSA (methicillin resistant Staphylococcus aureus)    MRSA infection 01/09/2018   Overview:  states winter of 2016, treated at [facility] by Lucita Lora NP  Formatting of this note might be different from the original. states winter of 2016, treated at [place] [facility] by Lucita Lora NP   Neuropathy    Obesity    Palpitations 01/20/2017   Pneumonia    Pre-syncope    Sinus bradycardia 02/10/2021   Sleep apnea    Varicose veins of bilateral lower extremities with other complications 26/71/2458     Past Surgical History:  Procedure Laterality Date   ABDOMINAL HYSTERECTOMY     BACK SURGERY     BLADDER SURGERY     CATARACT EXTRACTION, BILATERAL     COLON SURGERY  2012   Due to colon cancer stage 3   COLONOSCOPY  04/09/2015   Colonic polyp status post polypectomy. Mild pancolonic diverticulosis/ History of colon cancer status post right hemicolectomy. No evidence of recurrence    ESOPHAGOGASTRODUODENOSCOPY  05/26/2017   Schatzkis ring statust post esophageal dilatation. Hiatal hernia.    EYE SURGERY Left 01/26/2022   to remove floaters   PORT-A-CATH REMOVAL     removal kidney stones     TUBAL LIGATION      Family History  Problem Relation Age of Onset   CAD Mother        Age of Onset 48   Hypertension Mother    Kidney disease Mother    CVA Brother    Heart failure Father    COPD Father    Colon cancer Neg Hx    Rectal cancer Neg Hx    Stomach cancer Neg Hx     Social History   Tobacco Use   Smoking status: Never   Smokeless tobacco: Never  Vaping Use   Vaping Use: Never used  Substance Use Topics   Alcohol use: No   Drug use: No    Current Outpatient Medications  Medication Sig Dispense Refill   aspirin EC 81 MG tablet Take 81 mg by mouth daily.     B Complex-Folic Acid (B COMPLEX-VITAMIN B12 PO) Take 1 tablet by mouth daily. Unknown strength     CALCIUM PO Take 2 tablets by mouth daily. Unknown strength     Cholecalciferol (VITAMIN D3) 2000 units TABS Take 2,000 Units by mouth daily.     CINNAMON PO Take 1 tablet by mouth daily. Unknown strength     dexlansoprazole (DEXILANT) 60 MG capsule Take 1 capsule (60 mg total) by mouth daily. 90 capsule 0   diltiazem (CARDIZEM CD) 120 MG 24 hr capsule Take 1 capsule by mouth once daily (Patient taking differently: Take 120 mg by mouth daily.) 90 capsule 1   DULoxetine (CYMBALTA) 60 MG capsule Take 60 mg by mouth daily.  1   famotidine (PEPCID) 40 MG tablet Take 1 tablet (40 mg total) by mouth at bedtime. Please  call [phone] to schedule an office visit for more refills 90 tablet 0   furosemide (LASIX) 20 MG tablet Take 1 tablet (20 mg total) by mouth daily. (Patient taking differently: Take by mouth. 20 mg on Tues, Thur, Sat, Sun 40 mg Mon , Wed, Fri) 90 tablet 3   gabapentin (NEURONTIN) 800 MG tablet Take 800 mg by mouth 3 (three) times daily.     meloxicam (MOBIC) 15 MG tablet Take 15 mg by mouth daily.  metoprolol succinate (TOPROL-XL) 25 MG 24 hr tablet Take 25 mg by mouth 2 (two) times daily.     Multiple Vitamins-Minerals (CENTRUM SILVER 50+WOMEN) TABS Take 1 tablet by mouth daily. Unknown strength     nitroGLYCERIN (NITROSTAT) 0.4 MG SL tablet Place 1 tablet (0.4 mg total) under the tongue every 5 (five) minutes x 3 doses as needed for chest pain. 25 tablet 2   ofloxacin (OCUFLOX) 0.3 % ophthalmic solution Place 1 drop into the left eye 4 (four) times daily.     prednisoLONE acetate (PRED FORTE) 1 % ophthalmic suspension Place 1 drop into the left eye in the morning, at noon, in the evening, and at bedtime.     RABEprazole (ACIPHEX) 20 MG tablet Take 20 mg by mouth daily.     ranolazine (RANEXA) 500 MG 12 hr tablet Take 1 tablet by mouth twice daily 180 tablet 2   rosuvastatin (CRESTOR) 20 MG tablet Take 20 mg by mouth at bedtime.     Current Facility-Administered Medications  Medication Dose Route Frequency Provider Last Rate Last Admin   0.9 %  sodium chloride infusion  500 mL Intravenous Once Jackquline Denmark, MD        Allergies  Allergen Reactions   Penicillins Rash    Has patient had a PCN reaction causing immediate rash, facial/tongue/throat swelling, SOB or lightheadedness with hypotension: Yes Has patient had a PCN reaction causing severe rash involving mucus membranes or skin necrosis: No Has patient had a PCN reaction that required hospitalization: No Has patient had a PCN reaction occurring within the last 10 years: No If all of the above answers are "NO", then may proceed  with Cephalosporin use.    Codeine Other (See Comments)    Dizziness and lethargy    Aciphex [Rabeprazole] Rash   Guaifenesin & Derivatives Rash   Latex Rash    Usually from adhesive tapes   Other Rash    Tape cannot be on the skin for an extended period of time   Pseudoephedrine Rash   Sulfa Antibiotics Rash   Sulfasalazine Rash   Tape Rash    Cannot be on the skin for an extended period of time    Review of Systems:  neg     Physical Exam:    BP 120/80 (BP Location: Left Arm, Patient Position: Sitting, Cuff Size: Normal)   Pulse 60   Ht 4' 11.5" (1.511 m) Comment: height measured without shoes  Wt 151 lb 4 oz (68.6 kg)   LMP  (LMP Unknown)   BMI 30.04 kg/m  Filed Weights   01/28/22 0821  Weight: 151 lb 4 oz (68.6 kg)  Gen: awake, alert, NAD HEENT: anicteric, no pallor CV: RRR, no mrg Pulm: CTA b/l Abd: soft, NT/ND, +BS throughout Ext: no c/c/e Neuro: nonfocal       Carmell Austria, MD 01/28/2022, 8:46 AM  Cc: Madison Hickman, FNP

## 2022-01-28 NOTE — Patient Instructions (Addendum)
_______________________________________________________  If you are age 68 or older, your body mass index should be between 23-30. Your Body mass index is 30.04 kg/m. If this is out of the aforementioned range listed, please consider follow up with your Primary Care Provider.  If you are age 62 or younger, your body mass index should be between 19-25. Your Body mass index is 30.04 kg/m. If this is out of the aformentioned range listed, please consider follow up with your Primary Care Provider.   ________________________________________________________  The [facility] providers would like to encourage you to use [facility] to communicate with providers for non-urgent requests or questions.  Due to long hold times on the telephone, sending your provider a message by [facility] may be a faster and more efficient way to get a response.  Please allow 48 business hours for a response.  Please remember that this is for non-urgent requests.  _______________________________________________________  Dennis Bast have been scheduled for an endoscopy. Please follow written instructions given to you at your visit today. If you use inhalers (even only as needed), please bring them with you on the day of your procedure.  We have sent the following medications to your pharmacy for you to pick up at your convenience: Dexilant Pepcid  Stop aciphex  Please purchase the following medications over the counter and take as directed: Fleet enema one tonight and one the morning  Miralax 17g 2 times daily Senna can do 4 a day  Drink plenty water.  Thank you,  Dr. Jackquline Denmark

## 2022-02-16 DIAGNOSIS — R7301 Impaired fasting glucose: Secondary | ICD-10-CM | POA: Diagnosis not present

## 2022-02-16 DIAGNOSIS — E785 Hyperlipidemia, unspecified: Secondary | ICD-10-CM | POA: Diagnosis not present

## 2022-02-25 DIAGNOSIS — Z8709 Personal history of other diseases of the respiratory system: Secondary | ICD-10-CM | POA: Diagnosis not present

## 2022-02-25 DIAGNOSIS — Z09 Encounter for follow-up examination after completed treatment for conditions other than malignant neoplasm: Secondary | ICD-10-CM | POA: Diagnosis not present

## 2022-02-25 DIAGNOSIS — J387 Other diseases of larynx: Secondary | ICD-10-CM | POA: Diagnosis not present

## 2022-03-01 IMAGING — RF DG ESOPHAGUS
17 of 19 series · 20 of 24 positions shown · non-contrast
Comparison: CT abdomen pelvis 07/23/2020

CLINICAL DATA: Gerd with HH, LRP/hoarsness dysphagia

EXAM:
ESOPHOGRAM / BARIUM SWALLOW / BARIUM TABLET STUDY
TECHNIQUE: Combined double contrast and single contrast examination performed
using effervescent crystals, thick barium liquid, and thin barium
liquid. The patient was observed with fluoroscopy swallowing a 13 mm
barium sulphate tablet.
FLUOROSCOPY TIME:  Fluoroscopy Time:  3 minutes 24 seconds
Radiation Exposure Index (if provided by the fluoroscopic device):
51.2 mGy
Number of Acquired Spot Images: 4

[Series 1: cp_standard · 0.59mm/px · 1 of 2 frames shown (1 of 17)]
[frame 1/2]
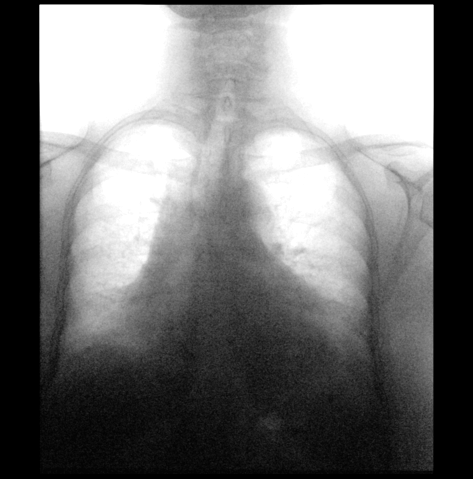

[Series 2: cp_standard · 0.36mm/px · 1 of 104 frames shown (2 of 17)]
[frame 16/104]
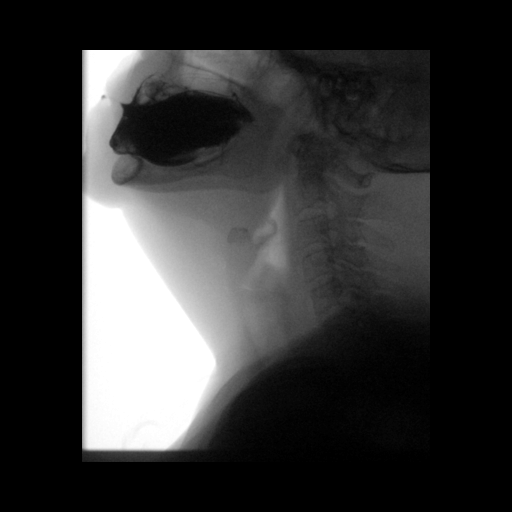

[Series 4: cp_standard · 0.56mm/px · 1 of 38 frames shown (3 of 17)]
[frame 20/38]
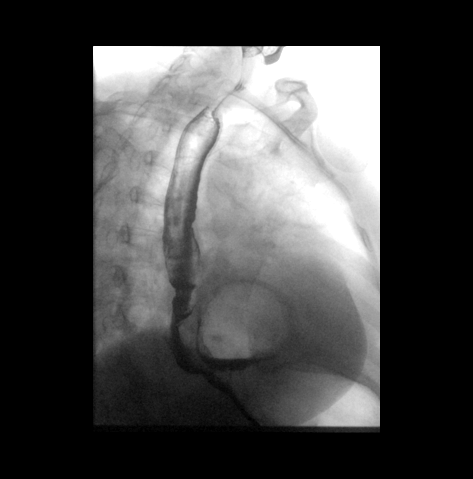

[Series 6: cp_standard · 0.56mm/px · 1 of 38 frames shown (4 of 17)]
[frame 20/38]
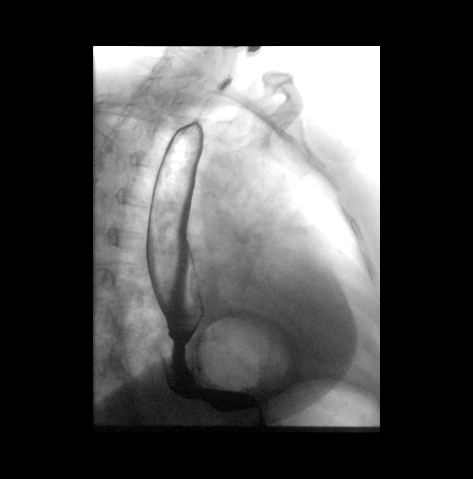

[Series 7: cp_standard · 0.56mm/px · 2 of 21 frames shown (5 of 17)]
[frame 1/21]
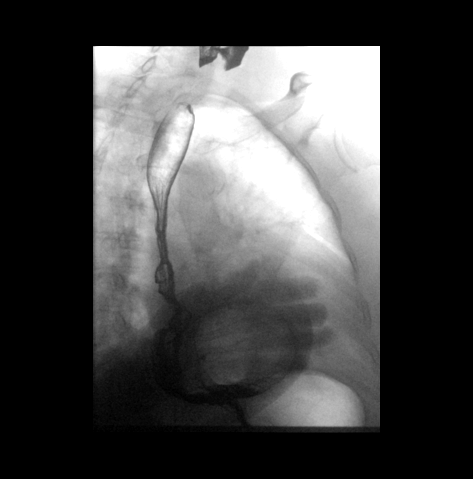
[frame 18/21]
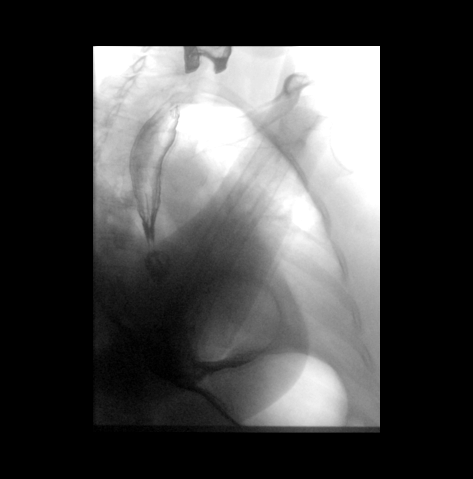

[Series 9: cp_standard · 0.55mm/px · 1 of 195 frames shown (6 of 17)]
[frame 30/195]
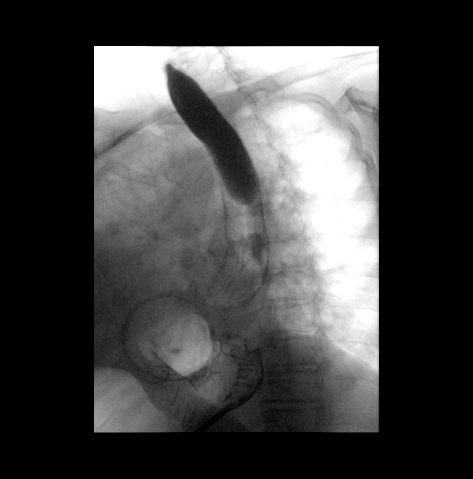

[Series 10: cp_standard · 0.55mm/px · 1 of 97 frames shown (7 of 17)]
[frame 83/97]
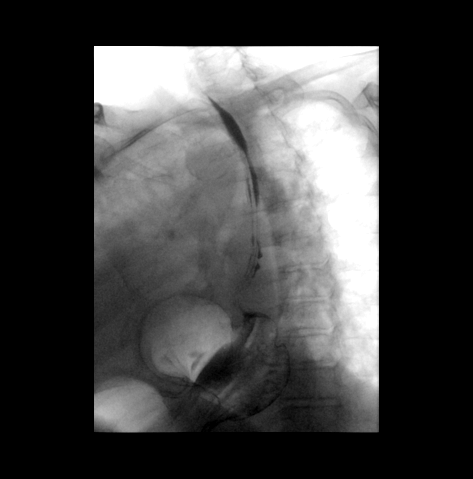

[Series 12: cp_standard · 0.55mm/px · 1 of 38 frames shown (8 of 17)]
[frame 20/38]
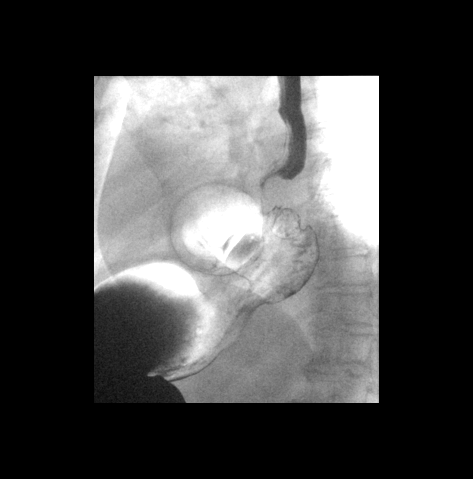

[Series 13: cp_standard · 0.55mm/px · 1 of 128 frames shown (9 of 17)]
[frame 20/128]
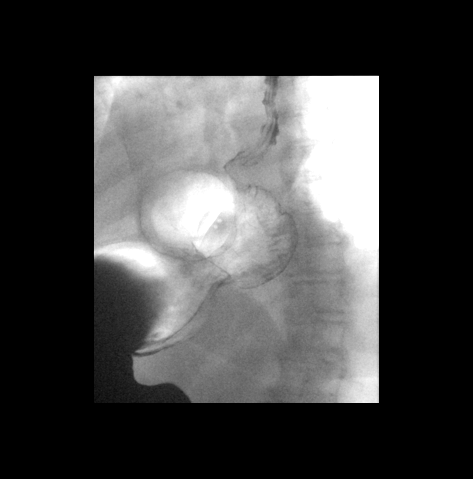

[Series 14: cp_standard · 0.54mm/px · 1 of 2 frames shown (10 of 17)]
[frame 1/2]
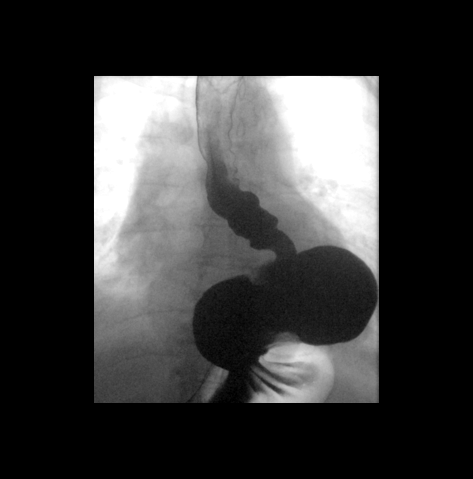

[Series 15: cp_standard · 0.54mm/px · 1 of 31 frames shown (11 of 17)]
[frame 14/31]
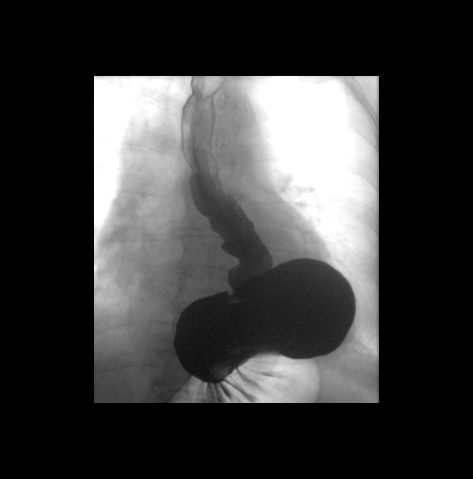

[Series 16: cp_standard · 0.54mm/px · 1 of 44 frames shown (12 of 17)]
[frame 38/44]
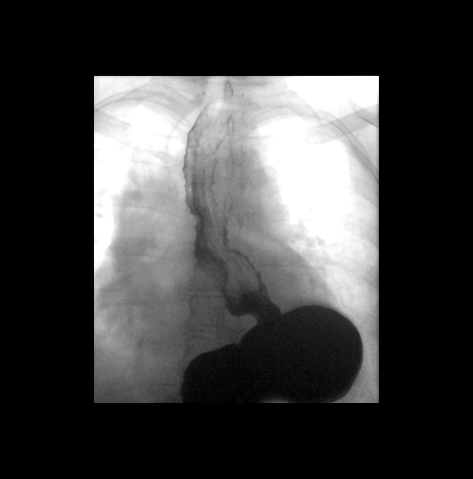

[Series 17: cp_standard · 0.54mm/px · 1 of 10 frames shown (13 of 17)]
[frame 9/10]
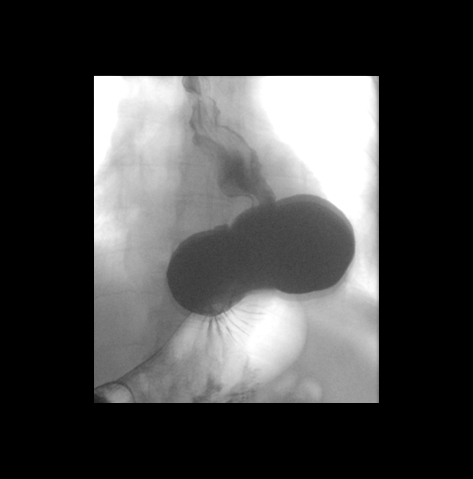

[Series 19: cp_standard · 0.54mm/px · 1 of 40 frames shown (14 of 17)]
[frame 7/40]
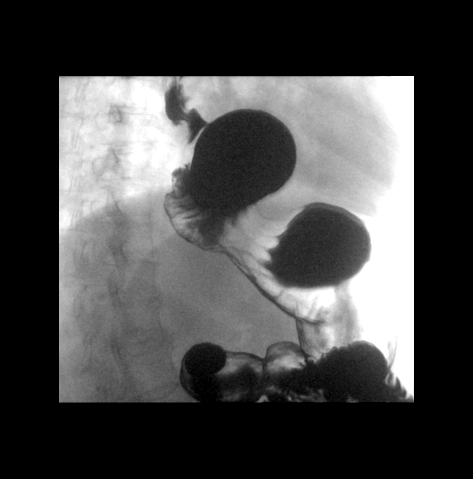

[Series 20: cp_standard · 0.54mm/px · 2 of 88 frames shown (15 of 17)]
[frame 12/88]
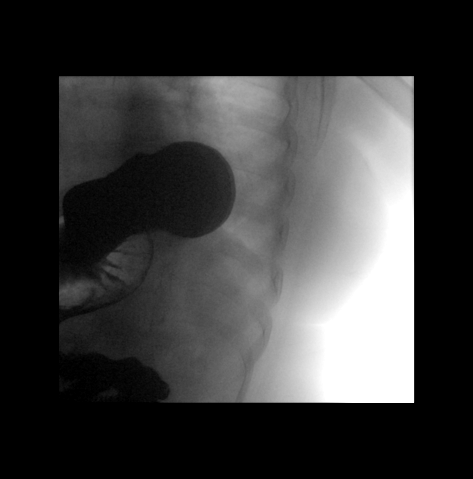
[frame 75/88]
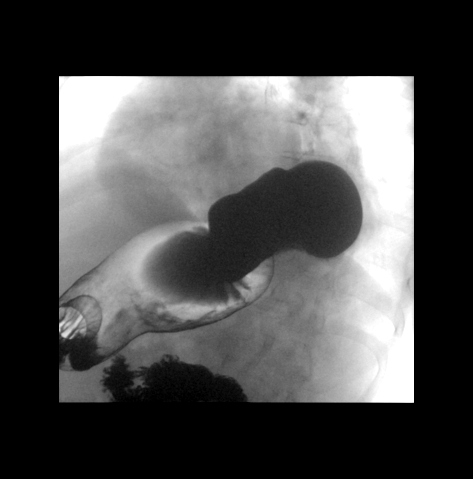

[Series 22: cp_standard · 0.60mm/px · 1 of 49 frames shown (16 of 17)]
[frame 42/49]
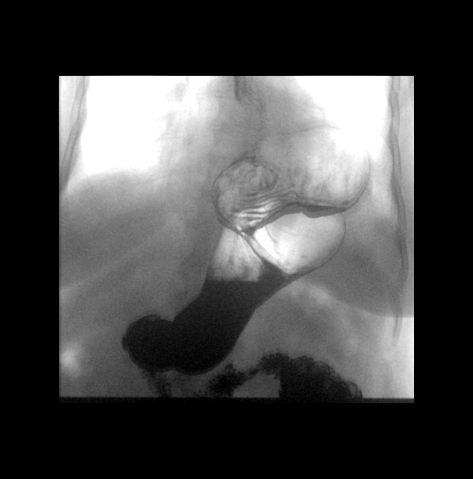

[Series 24: cp_standard · 0.59mm/px · 2 of 173 frames shown (17 of 17)]
[frame 26/173]
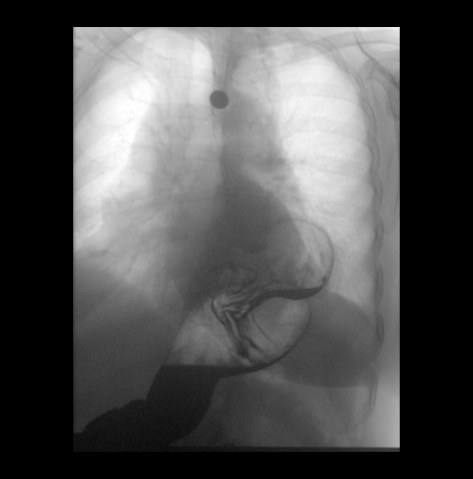
[frame 164/173]
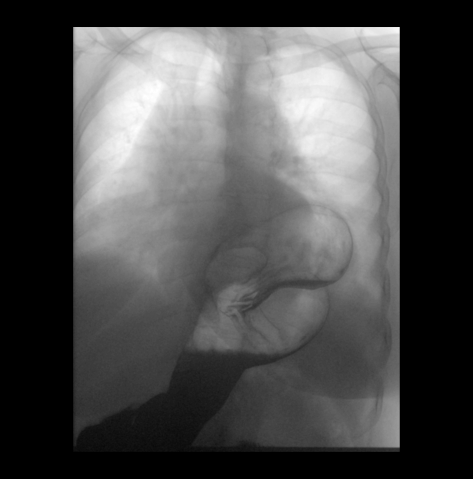

[20 of 24 positions shown; findings below may reference images not displayed]

FINDINGS: The pharyngeal phase of swallowing was normal.

Moderate esophageal dysmotility with some tertiary contractions. No
obstruction to the forward flow of contrast throughout the esophagus
and into the stomach.

Normal esophageal course and contour. Normal esophageal mucosal
pattern. No evidence of esophageal stricture, ulceration, or other
significant abnormality.

Large hiatal hernia, with partially intrathoracic stomach. No
gastroesophageal reflux occurred spontaneously or was elicited
during the exam.

A 13 mm barium tablet passed without difficulty into the stomach.
IMPRESSION: Large hiatal hernia with partially intrathoracic stomach.

Moderate esophageal dysmotility with some tertiary contractions.

Normal passage of a 13 mm barium tablet in the stomach.

## 2022-03-07 NOTE — Progress Notes (Signed)
[facility]  [address] [place],  [place]  [address] [phone]  Clinic Day: March 08, 2022  Referring physician: Madison Hickman, FNP  CHIEF COMPLAINT:  CC:  History of stage IIIB colon cancer with mild elevation of the CEA  Current Treatment:   Observation   HISTORY OF PRESENT ILLNESS:  [name] is a 66 y.o. 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  She was treated with surgical resection.  Pathology revealed a 3.6 centimeter, grade 2, adenocarcinoma with 3 of 45 nodes positive for metastasis.  She received adjuvant chemotherapy with FOLFOX for 5 cycles.  Oxaliplatin was then discontinued because of severe cytopenias.  She continued 5 fluorouracil and leucovorin for 4 more cycles, but then developed a severe allergic reaction to the 5-FU.  She was then placed on Xeloda for 6 weeks to complete a full 6 months of adjuvant chemotherapy.  She also had difficulty tolerating the Xeloda.  She has not had evidence of recurrence.  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  The patient is a nonsmoker.  She had a CT abdomen/pelvis in June 2016 for abdominal pain, but this was negative other than a small hiatal hernia.  MRI of the lumbar spine in July 2019 revealed left sided neural impingement at L1-2 with multilevel spondylosis. She had spinal surgery with Dr. Sherlyn Lick.  Her CEA was mildly elevated in April 2021 at 5.5. CT chest, abdomen and pelvis at that time did not reveal any evidence of malignancy. The CEA was 5 in July, then 5.2 in October. Colonoscopy with Dr. Lyndel Safe in December 2021 was unremarkable. Repeat in 5 years was recommended.   The CEA went up to 7.5 in January 2022, and went down to 7.0 in February. CT chest, abdomen  and pelvis from February did not reveal any evidence of local recurrence of metastatic disease. There was a stable moderate to large hiatal hernia noted.  The CEA was down to 5.6 in May, then 5.2 in August. She states she has 40% coronary artery blockage, for which she is on aspirin 81 mg daily, metoprolol 25 mg twice daily, nitroglycerin prn, and rosuvastatin 20 mg daily through Dr. Agustin Cree. CT chest, abdomen and pelvis from November 9th of 2022 revealed no findings to suggest metastatic disease. CEA went back up to 5.9 in November of 2022. CEA was 4.5 in April 2023, the best it's been, and first normal reading since January of 2022. She has never been a smoker.  INTERVAL HISTORY:  [name] is here for routine follow up and states that she has been well and denies complaints. She is seeing an ENT specialist at [facility] regarding her hoarseness. 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  He found a "rusty" lesion but this was resolved when he repeated the nasopharyngoscopy.  Blood counts and chemistries are unremarkable other than a low sodium of 133 and creatinine of 1.1. Her  appetite is good, and she has gained 9 pounds since her last visit.  She denies fever, chills or other signs of infection.  She denies nausea, vomiting, bowel issues, or abdominal pain.  She denies sore throat, cough, dyspnea, or chest pain.  She sees Dr. Lyndel Safe  again on October 10 and he plans an EGD.  She is now on Lasix 20 mg daily and Dr. Lyndel Safe recommended 4 pills at bedtime.  REVIEW OF SYSTEMS:  Review of Systems  Constitutional: Negative.  Negative for appetite change, chills, fatigue, fever and unexpected weight change.  Eyes: Negative.   Respiratory: Negative.  Negative for chest tightness, cough, hemoptysis, shortness of breath and wheezing.   Cardiovascular: Negative.  Negative for chest pain, leg swelling and palpitations.  Gastrointestinal: Negative.  Negative for abdominal  distention, abdominal pain, blood in stool, constipation, diarrhea, nausea and vomiting.  Endocrine: Negative.   Genitourinary: Negative.  Negative for difficulty urinating, dysuria, frequency and hematuria.   Musculoskeletal: Negative.  Negative for arthralgias, back pain, flank pain, gait problem and myalgias.  Skin: Negative.   Neurological: Negative.  Negative for dizziness, extremity weakness, gait problem, headaches, light-headedness, numbness, seizures and speech difficulty.  Hematological: Negative.   Psychiatric/Behavioral: Negative.  Negative for depression and sleep disturbance. The patient is not nervous/anxious.      VITALS:  Blood pressure 126/79, pulse 68, temperature 98 F (36.7 C), temperature source Oral, resp. rate 16, height 4' 11.5" (1.511 m), weight 160 lb 14.4 oz (73 kg), SpO2 97 %.  Wt Readings from Last 3 Encounters:  03/23/22 151 lb (68.5 kg)  03/08/22 160 lb 14.4 oz (73 kg)  01/28/22 151 lb 4 oz (68.6 kg)    Body mass index is 31.95 kg/m.  Performance status (ECOG): 0 - Asymptomatic  PHYSICAL EXAM:  Physical Exam Constitutional:      General: She is not in acute distress.    Appearance: Normal appearance. She is normal weight.  HENT:     Head: Normocephalic and atraumatic.  Eyes:     General: No scleral icterus.    Extraocular Movements: Extraocular movements intact.     Conjunctiva/sclera: Conjunctivae normal.     Pupils: Pupils are equal, round, and reactive to light.  Cardiovascular:     Rate and Rhythm: Normal rate and regular rhythm.     Pulses: Normal pulses.     Heart sounds: Normal heart sounds. No murmur heard.    No friction rub. No gallop.  Pulmonary:     Effort: Pulmonary effort is normal. No respiratory distress.     Breath sounds: Normal breath sounds.  Abdominal:     General: Bowel sounds are normal. There is no distension.     Palpations: Abdomen is soft. There is no hepatomegaly, splenomegaly or mass.     Tenderness: There is  no abdominal tenderness.  Musculoskeletal:        General: Normal range of motion.     Cervical back: Normal range of motion and neck supple.     Right lower leg: No edema.     Left lower leg: No edema.  Lymphadenopathy:     Cervical: No cervical adenopathy.  Skin:    General: Skin is warm and dry.  Neurological:     General: No focal deficit present.     Mental Status: She is alert and oriented to person, place, and time. Mental status is at baseline.  Psychiatric:        Mood and Affect: Mood normal.        Behavior: Behavior normal.        Thought Content: Thought content normal.        Judgment: Judgment normal.     LABS:      Latest Ref Rng & Units 03/08/2022  12:00 AM 10/02/2021   12:00 AM 05/04/2021   12:00 AM  CBC  WBC  5.1     6.0     5.3   Hemoglobin 12.0 - 16.0 12.7     12.5     13.1   Hematocrit 36 - 46 38     39     40   Platelets 150 - 400 K/uL 180     171     139      This result is from an external source.      Latest Ref Rng & Units 03/08/2022   12:00 AM 12/08/2021    9:56 AM 10/02/2021   12:00 AM  CMP  Glucose 70 - 99 mg/dL  123    BUN 4 - '21 10     13  8      '$ Creatinine 0.5 - 1.1 1.1     1.20  0.9      Sodium 137 - 147 133     138  136      Potassium 3.5 - 5.1 mEq/L 4.3     4.0  4.3      Chloride 99 - 108 105     101  104      CO2 13 - '22 24     21  22      '$ Calcium 8.7 - 10.7 9.9     9.7  9.6      Alkaline Phos 25 - 125 93      85      AST 13 - 35 32      33      ALT 7 - 35 U/L 21      19         This result is from an external source.     Lab Results  Component Value Date   CEA1 5.4 (H) 03/17/2022   /  CEA  Date Value Ref Range Status  03/17/2022 5.4 (H) 0.0 - 4.7 ng/mL Final    Comment:    (NOTE)                             Nonsmokers          <3.9                             Smokers             <5.6 [employer] Electrochemiluminescence Immunoassay (ECLIA) Values obtained with different assay methods or kits cannot be used  interchangeably.  Results cannot be interpreted as absolute evidence of the presence or absence of malignant disease. Performed At: [facility] [place], [place] [id number] Rush Farmer MD LO:[id number]     STUDIES:    EXAM: 04/22/2021 CT CHEST, ABDOMEN, AND PELVIS WITH CONTRAST  TECHNIQUE: Multidetector CT imaging of the chest, abdomen and pelvis was performed following the standard protocol during bolus administration of intravenous contrast.  CONTRAST: 100 mL of Isovue 370  COMPARISON: CT the chest, abdomen and pelvis 07/23/2020.  FINDINGS: CT CHEST FINDINGS Cardiovascular: Heart size is mildly enlarged with left atrial dilatation. There is no significant pericardial fluid, thickening or pericardial calcification. There is aortic atherosclerosis, as well as atherosclerosis of the great vessels of the mediastinum and the coronary arteries, including calcified atherosclerotic plaque in the left anterior descending coronary artery. Mediastinum/Nodes: No pathologically enlarged  mediastinal or hilar lymph nodes. Large hiatal hernia. No axillary lymphadenopathy. Lungs/Pleura: No suspicious appearing pulmonary nodules or masses are noted. No acute consolidative airspace disease. No pleural effusions. Musculoskeletal: There are no aggressive appearing lytic or blastic lesions noted in the visualized portions of the skeleton.  CT ABDOMEN PELVIS FINDINGS Hepatobiliary: 1.4 x 1.2 cm low-attenuation lesion between segments 5 and 6 in the liver, similar to prior study, compatible with a benign cyst. No other suspicious appearing hepatic lesions. No intra or extrahepatic biliary ductal dilatation. Gallbladder is normal in appearance. Pancreas: No pancreatic mass. No pancreatic ductal dilatation. No pancreatic or peripancreatic fluid collections or inflammatory changes. Spleen: Unremarkable. Adrenals/Urinary Tract: Bilateral kidneys and adrenal glands  are normal in appearance. No hydroureteronephrosis. Urinary bladder is normal in appearance. Stomach/Bowel: Intra-abdominal portion of the stomach is unremarkable. No pathologic dilatation of small bowel or colon. Several colonic diverticulae are noted, particularly in the distal descending colon and proximal sigmoid colon, without surrounding inflammatory changes to suggest an acute diverticulitis at this time. Postoperative changes of prior partial colectomy are noted in the right side of the colon. Vascular/Lymphatic: Aortic atherosclerosis, without evidence of aneurysm or dissection in the abdominal or pelvic vasculature. No lymphadenopathy noted in the abdomen or pelvis. Reproductive: Status post hysterectomy. Ovaries are not confidently identified may be surgically absent or atrophic. Other: No significant volume of ascites. No pneumoperitoneum. Musculoskeletal: Status post left-sided PLIF at L4-L5 with interbody cage at L4-L5 interspace. There are no aggressive appearing lytic or blastic lesions noted in the visualized portions of the skeleton.  IMPRESSION: 1. No findings to suggest metastatic disease in the chest, abdomen or pelvis. 2. No acute findings to account for the patient's history of left upper quadrant abdominal pain. 3. Mild colonic diverticulosis without evidence of acute diverticulitis at this time. 4. Large hiatal hernia. 5. Aortic atherosclerosis. 6. Cardiomegaly with left atrial dilatation. 7. Additional incidental findings, as above.  HISTORY:   Allergies:  Allergies  Allergen Reactions   Penicillins Rash    Has patient had a PCN reaction causing immediate rash, facial/tongue/throat swelling, SOB or lightheadedness with hypotension: Yes Has patient had a PCN reaction causing severe rash involving mucus membranes or skin necrosis: No Has patient had a PCN reaction that required hospitalization: No Has patient had a PCN reaction occurring within the  last 10 years: No If all of the above answers are "NO", then may proceed with Cephalosporin use.    Codeine Other (See Comments)    Dizziness and lethargy    Aciphex [Rabeprazole] Rash   Guaifenesin & Derivatives Rash   Latex Rash    Usually from adhesive tapes   Other Rash    Tape cannot be on the skin for an extended period of time   Pseudoephedrine Rash   Sulfa Antibiotics Rash   Sulfasalazine Rash   Tape Rash    Cannot be on the skin for an extended period of time    Current Medications: Current Outpatient Medications  Medication Sig Dispense Refill   aspirin EC 81 MG tablet Take 81 mg by mouth daily.     B Complex-Folic Acid (B COMPLEX-VITAMIN B12 PO) Take 1 tablet by mouth daily. Unknown strength     CALCIUM PO Take 2 tablets by mouth daily. Unknown strength     Cholecalciferol (VITAMIN D3) 2000 units TABS Take 2,000 Units by mouth daily.     CINNAMON PO Take 1 tablet by mouth daily. Unknown strength     dexlansoprazole (DEXILANT) 60 MG  capsule Take 1 capsule (60 mg total) by mouth daily. 90 capsule 4   diltiazem (CARDIZEM CD) 120 MG 24 hr capsule Take 1 capsule by mouth once daily (Patient taking differently: 120 mg daily.) 90 capsule 1   DULoxetine (CYMBALTA) 60 MG capsule Take 60 mg by mouth daily.  1   famotidine (PEPCID) 20 MG tablet Take 1 tablet (20 mg total) by mouth at bedtime. 90 tablet 4   furosemide (LASIX) 20 MG tablet Take 1 tablet (20 mg total) by mouth daily. 90 tablet 3   gabapentin (NEURONTIN) 800 MG tablet Take 800 mg by mouth 3 (three) times daily.     meloxicam (MOBIC) 15 MG tablet Take 15 mg by mouth daily.     metoprolol succinate (TOPROL-XL) 25 MG 24 hr tablet Take 25 mg by mouth 2 (two) times daily.     Multiple Vitamins-Minerals (CENTRUM SILVER 50+WOMEN) TABS Take 1 tablet by mouth daily. Unknown strength     nitroGLYCERIN (NITROSTAT) 0.4 MG SL tablet Place 1 tablet (0.4 mg total) under the tongue every 5 (five) minutes x 3 doses as needed for chest  pain. 25 tablet 2   ofloxacin (OCUFLOX) 0.3 % ophthalmic solution Place 1 drop into the left eye 4 (four) times daily.     prednisoLONE acetate (PRED FORTE) 1 % ophthalmic suspension Place 1 drop into the left eye in the morning, at noon, in the evening, and at bedtime. (Patient not taking: Reported on 03/23/2022)     RABEprazole (ACIPHEX) 20 MG tablet Take 20 mg by mouth daily.     ranolazine (RANEXA) 500 MG 12 hr tablet Take 1 tablet by mouth twice daily 180 tablet 2   rosuvastatin (CRESTOR) 20 MG tablet Take 20 mg by mouth at bedtime.     Current Facility-Administered Medications  Medication Dose Route Frequency Provider Last Rate Last Admin   0.9 %  sodium chloride infusion  500 mL Intravenous Once Jackquline Denmark, MD       0.9 %  sodium chloride infusion  500 mL Intravenous Once Jackquline Denmark, MD         ASSESSMENT & PLAN:   Assessment:   1. History of stage IIIB colon cancer diagnosed in December 2012.  The patient remains without obvious evidence of recurrence.  Colonoscopy in December 2021 was unremarkable and she will be due repeat examination in 2026.  Dr. Lyndel Safe plans an EGD this year  2. Mild elevation of the CEA of uncertain etiology.  CT imaging in February and November 2022 did not reveal any evidence of malignancy.  CEA was in the normal range in April 2023.   3. 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 Sherlyn Lick.  Plan:   We will see her back in 1 year with a CBC, comprehensive metabolic panel and CEA for continued follow-up due to the elevated CEA.  She is now over 10 years postop.  If her annual mammogram has not been scheduled for her by then, I can order it.  The patient understands the plans discussed today and is in agreement with them.  She knows to contact our office if she develops concerns prior to her next appointment.  I provided 15 minutes of face-to-face time during this this encounter and > 50% was spent  counseling as documented under my assessment and plan.    ADDENDUM: Her CEA was slightly elevated at 5.3 and a repeat confirms 5.4.  I will therefore have her  repeat a CEA in 6 months.  This has been going on for years with no evidence of recurrence or other malignancy.

## 2022-03-08 ENCOUNTER — Other Ambulatory Visit: Payer: Self-pay | Admitting: Oncology

## 2022-03-08 ENCOUNTER — Inpatient Hospital Stay: Payer: Medicare HMO | Attending: Oncology | Admitting: Oncology

## 2022-03-08 ENCOUNTER — Inpatient Hospital Stay: Payer: Medicare HMO

## 2022-03-08 ENCOUNTER — Encounter: Payer: Self-pay | Admitting: Oncology

## 2022-03-08 VITALS — BP 126/79 | HR 68 | Temp 98.0°F | Resp 16 | Ht 59.5 in | Wt 160.9 lb

## 2022-03-08 DIAGNOSIS — C182 Malignant neoplasm of ascending colon: Secondary | ICD-10-CM

## 2022-03-08 DIAGNOSIS — Z85038 Personal history of other malignant neoplasm of large intestine: Secondary | ICD-10-CM | POA: Insufficient documentation

## 2022-03-08 DIAGNOSIS — R970 Elevated carcinoembryonic antigen [CEA]: Secondary | ICD-10-CM | POA: Diagnosis not present

## 2022-03-08 DIAGNOSIS — D649 Anemia, unspecified: Secondary | ICD-10-CM | POA: Diagnosis not present

## 2022-03-08 LAB — COMPREHENSIVE METABOLIC PANEL
Albumin: 4.3 (ref 3.5–5.0)
Calcium: 9.9 (ref 8.7–10.7)

## 2022-03-08 LAB — BASIC METABOLIC PANEL
BUN: 10 (ref 4–21)
CO2: 24 — AB (ref 13–22)
Chloride: 105 (ref 99–108)
Creatinine: 1.1 (ref 0.5–1.1)
Glucose: 112
Potassium: 4.3 mEq/L (ref 3.5–5.1)
Sodium: 133 — AB (ref 137–147)

## 2022-03-08 LAB — CBC AND DIFFERENTIAL
HCT: 38 (ref 36–46)
Hemoglobin: 12.7 (ref 12.0–16.0)
Neutrophils Absolute: 2.81
Platelets: 180 10*3/uL (ref 150–400)
WBC: 5.1

## 2022-03-08 LAB — HEPATIC FUNCTION PANEL
ALT: 21 U/L (ref 7–35)
AST: 32 (ref 13–35)
Alkaline Phosphatase: 93 (ref 25–125)
Bilirubin, Total: 0.6

## 2022-03-08 LAB — CBC: RBC: 4.14 (ref 3.87–5.11)

## 2022-03-11 DIAGNOSIS — Z1231 Encounter for screening mammogram for malignant neoplasm of breast: Secondary | ICD-10-CM | POA: Diagnosis not present

## 2022-03-17 ENCOUNTER — Other Ambulatory Visit: Payer: Self-pay | Admitting: Hematology and Oncology

## 2022-03-17 ENCOUNTER — Inpatient Hospital Stay: Payer: Medicare HMO | Attending: Oncology

## 2022-03-17 DIAGNOSIS — C182 Malignant neoplasm of ascending colon: Secondary | ICD-10-CM

## 2022-03-17 DIAGNOSIS — R970 Elevated carcinoembryonic antigen [CEA]: Secondary | ICD-10-CM | POA: Insufficient documentation

## 2022-03-17 DIAGNOSIS — Z85038 Personal history of other malignant neoplasm of large intestine: Secondary | ICD-10-CM | POA: Insufficient documentation

## 2022-03-18 LAB — CEA: CEA: 5.3 ng/mL — ABNORMAL HIGH (ref 0.0–4.7)

## 2022-03-19 ENCOUNTER — Telehealth: Payer: Self-pay

## 2022-03-19 LAB — CEA: CEA: 5.4 ng/mL — ABNORMAL HIGH (ref 0.0–4.7)

## 2022-03-19 NOTE — Telephone Encounter (Signed)
-----   Message from Marvia Pickles, PA-C sent at 03/19/2022  6:37 AM EDT ----- We had to redraw her this week. She is awaiting a call. Thanks ----- Message ----- From: Buel Ream, [facility] [place] Sent: 03/19/2022   5:37 AM EDT To: Marvia Pickles, PA-C

## 2022-03-19 NOTE — Telephone Encounter (Signed)
Katrina will notify patient of results.

## 2022-03-22 ENCOUNTER — Telehealth: Payer: Self-pay

## 2022-03-22 ENCOUNTER — Other Ambulatory Visit: Payer: Self-pay | Admitting: Oncology

## 2022-03-22 DIAGNOSIS — C182 Malignant neoplasm of ascending colon: Secondary | ICD-10-CM

## 2022-03-22 NOTE — Telephone Encounter (Signed)
CEA up a little again at 5.3, 5.4 on recheck.   She has had this go up and down for a long time.  I just rec repeat in 6 months, will order, pls set up lab appt.   Patient notified of results

## 2022-03-23 ENCOUNTER — Ambulatory Visit (AMBULATORY_SURGERY_CENTER): Payer: Medicare HMO | Admitting: Gastroenterology

## 2022-03-23 ENCOUNTER — Encounter: Payer: Self-pay | Admitting: Gastroenterology

## 2022-03-23 VITALS — BP 132/75 | HR 62 | Temp 98.0°F | Resp 18 | Ht 59.0 in | Wt 151.0 lb

## 2022-03-23 DIAGNOSIS — K222 Esophageal obstruction: Secondary | ICD-10-CM

## 2022-03-23 DIAGNOSIS — K2970 Gastritis, unspecified, without bleeding: Secondary | ICD-10-CM

## 2022-03-23 DIAGNOSIS — I10 Essential (primary) hypertension: Secondary | ICD-10-CM | POA: Diagnosis not present

## 2022-03-23 DIAGNOSIS — R1310 Dysphagia, unspecified: Secondary | ICD-10-CM

## 2022-03-23 DIAGNOSIS — G4733 Obstructive sleep apnea (adult) (pediatric): Secondary | ICD-10-CM | POA: Diagnosis not present

## 2022-03-23 DIAGNOSIS — K219 Gastro-esophageal reflux disease without esophagitis: Secondary | ICD-10-CM

## 2022-03-23 DIAGNOSIS — K209 Esophagitis, unspecified without bleeding: Secondary | ICD-10-CM | POA: Diagnosis not present

## 2022-03-23 DIAGNOSIS — K449 Diaphragmatic hernia without obstruction or gangrene: Secondary | ICD-10-CM

## 2022-03-23 DIAGNOSIS — I2510 Atherosclerotic heart disease of native coronary artery without angina pectoris: Secondary | ICD-10-CM | POA: Diagnosis not present

## 2022-03-23 DIAGNOSIS — K3189 Other diseases of stomach and duodenum: Secondary | ICD-10-CM | POA: Diagnosis not present

## 2022-03-23 MED ORDER — SODIUM CHLORIDE 0.9 % IV SOLN: 500.0000 mL | Freq: Once | INTRAVENOUS | Status: DC

## 2022-03-23 NOTE — Progress Notes (Signed)
Called to room to assist during endoscopic procedure.  Patient ID and intended procedure confirmed with present staff. Received instructions for my participation in the procedure from the performing physician.  

## 2022-03-23 NOTE — Progress Notes (Signed)
Report to PACU, RN, vss, BBS= Clear.  

## 2022-03-23 NOTE — Progress Notes (Signed)
VS completed by CW.   Pt's states no medical or surgical changes since previsit or office visit.  

## 2022-03-23 NOTE — Progress Notes (Signed)
Chief Complaint: FU  Referring Provider:  Madison Hickman, FNP      ASSESSMENT AND PLAN;   #1. GERD with large HH with LPR/hoarsenss. Now with eso dysphagia. H/O Schatzki's ring s/p eso dil 2018.  #2.  H/O colon cancer (stage III, [id number]) s/p R hemicolectomy 2012.  H/O colonic polyps 03/2015.  Neg colon 05/2020, neg CT C/A/P Nov 2022.  Plan:  - EGD with eso dilatation. - Dexilant '60mg'$  po qd #90, 4 RF - Pepcid '20mg'$  po qhs. #90, 4 RF - Senna 4/day - Fleets ennema x 2. One tinight and one in AM - Miralax 17 g BID - Drink water. - Instructed patient to chew foods especially meats and breads well and eat slowly.  HPI:    [name] is a 66 y.o. female  For follow-up  S/P left eye Sx for floaters recently.  Still has eye shield on.  Also seen by ENT- neg eval except for ? VC nodule.  Awaiting biopsy.   Reflux is under good control with Dexilant in a.m. and Pepcid at bedtime.  She felt better after dilation.  Occasional dysphagia but not as bad.  She would like to have another dilation.  Also complains of constipation with no Bms x 2 weeks Took senna 2 previously with some relief. Complains of abdominal discomfort and bloating.  Most recently her CT chest/Abdo/pelvis November 2022 was unremarkable.  Her CEA level is down as compared to the previous level.  Her colonoscopy December 2021 did not show any recurrence.  No weight loss.  No melena or hematochezia.   Past GI procedures:  EGD 12/02/2020 - Schatzki ring. Biopsied. Dilated with 54Fr - 8 cm hiatal hernia with Cameron erosions. Biopsied. - Normal examined duodenum.  Colonoscopy 05/14/2020 -Pancolonic diverticulosis. -S/P right hemicolectomy. -Few erosions and neo-TI ?Importance (bx- mild inflammation.  No granulomas. -Non-bleeding internal hemorrhoids. -The examination was otherwise normal to anastomosis. No recurrence. -Repeat in 5 years.  CT chest/Abdo/pelvis 04/22/2021 -No evidence of  metastatic disease in chest, Abdo/pelvis -Large hiatal hernia -Mild sigmoid diverticulosis.  -EGD 05/26/2017 8 cm HH with small paraesophageal component, Schatzki ring s/p dil 52 Fr -Colonoscopy 04/09/2015 ([facility])-6 mm sessile polyp s/p polypectomy (Bx- TA), R hemi, mild pancolonic div. -CT chest Abdo/pelvis with p.o. and IV contrast 07/2020: No recurrence, moderate to large hiatal hernia, atherosclerosis. Past Medical History:  Diagnosis Date   Adjustment disorder    Anesthesia complication 6/38/4665   Overview:  "It takes me a while to wake up."  Formatting of this note might be different from the original. "It takes me a while to wake up."   Anxiety 01/09/2018   Anxiety disorder    Arthritis    hands, back   Ascending aortic aneurysm ([place]) 38 mm based on echocardiogram from 2021 05/29/2020   Atypical chest pain 12/19/2016   Borderline diabetes 01/09/2018   CAD (coronary artery disease)    Cancer ([place]) 01/09/2018   Overview:  colon  Formatting of this note might be different from the original. colon   Cataract    removed by surgery   Chest pain    Colon cancer ([place]) 2012   Depression    Depression 01/09/2018   Dysphonia    Dysthymic disorder    Fatty liver 01/09/2018   Fusion of lumbar spine 08/21/2018   Gastroesophageal reflux disease    Hiatal hernia with GERD 01/09/2018   History of kidney stones 01/09/2018   Hyperlipidemia    Insomnia  Kidney stones    removed by surgery   MRSA (methicillin resistant Staphylococcus aureus)    MRSA infection 01/09/2018   Overview:  states winter of 2016, treated at [facility] by Lucita Lora NP  Formatting of this note might be different from the original. states winter of 2016, treated at [facility] by Lucita Lora NP   Neuropathy    Obesity    Palpitations 01/20/2017   Pneumonia    Pre-syncope    Sinus bradycardia 02/10/2021   Sleep apnea    Varicose veins of bilateral lower extremities with other complications 16/03/9603     Past Surgical History:  Procedure Laterality Date   ABDOMINAL HYSTERECTOMY     BACK SURGERY     BLADDER SURGERY     CATARACT EXTRACTION, BILATERAL     COLON SURGERY  2012   Due to colon cancer stage 3   COLONOSCOPY  04/09/2015   Colonic polyp status post polypectomy. Mild pancolonic diverticulosis/ History of colon cancer status post right hemicolectomy. No evidence of recurrence    ESOPHAGOGASTRODUODENOSCOPY  05/26/2017   Schatzkis ring statust post esophageal dilatation. Hiatal hernia.    EYE SURGERY Left 01/26/2022   to remove floaters   PORT-A-CATH REMOVAL     removal kidney stones     TUBAL LIGATION     UPPER GASTROINTESTINAL ENDOSCOPY      Family History  Problem Relation Age of Onset   CAD Mother        Age of Onset 52   Hypertension Mother    Kidney disease Mother    Heart failure Father    COPD Father    CVA Brother    Colon cancer Neg Hx    Rectal cancer Neg Hx    Stomach cancer Neg Hx    Esophageal cancer Neg Hx     Social History   Tobacco Use   Smoking status: Never   Smokeless tobacco: Never  Vaping Use   Vaping Use: Never used  Substance Use Topics   Alcohol use: No   Drug use: No    Current Outpatient Medications  Medication Sig Dispense Refill   aspirin EC 81 MG tablet Take 81 mg by mouth daily.     B Complex-Folic Acid (B COMPLEX-VITAMIN B12 PO) Take 1 tablet by mouth daily. Unknown strength     CALCIUM PO Take 2 tablets by mouth daily. Unknown strength     Cholecalciferol (VITAMIN D3) 2000 units TABS Take 2,000 Units by mouth daily.     CINNAMON PO Take 1 tablet by mouth daily. Unknown strength     dexlansoprazole (DEXILANT) 60 MG capsule Take 1 capsule (60 mg total) by mouth daily. 90 capsule 4   diltiazem (CARDIZEM CD) 120 MG 24 hr capsule Take 1 capsule by mouth once daily (Patient taking differently: 120 mg daily.) 90 capsule 1   DULoxetine (CYMBALTA) 60 MG capsule Take 60 mg by mouth daily.  1   famotidine (PEPCID) 20 MG tablet  Take 1 tablet (20 mg total) by mouth at bedtime. 90 tablet 4   furosemide (LASIX) 20 MG tablet Take 1 tablet (20 mg total) by mouth daily. 90 tablet 3   gabapentin (NEURONTIN) 800 MG tablet Take 800 mg by mouth 3 (three) times daily.     meloxicam (MOBIC) 15 MG tablet Take 15 mg by mouth daily.     metoprolol succinate (TOPROL-XL) 25 MG 24 hr tablet Take 25 mg by mouth 2 (two) times daily.  Multiple Vitamins-Minerals (CENTRUM SILVER 50+WOMEN) TABS Take 1 tablet by mouth daily. Unknown strength     ofloxacin (OCUFLOX) 0.3 % ophthalmic solution Place 1 drop into the left eye 4 (four) times daily.     RABEprazole (ACIPHEX) 20 MG tablet Take 20 mg by mouth daily.     ranolazine (RANEXA) 500 MG 12 hr tablet Take 1 tablet by mouth twice daily 180 tablet 2   rosuvastatin (CRESTOR) 20 MG tablet Take 20 mg by mouth at bedtime.     nitroGLYCERIN (NITROSTAT) 0.4 MG SL tablet Place 1 tablet (0.4 mg total) under the tongue every 5 (five) minutes x 3 doses as needed for chest pain. 25 tablet 2   prednisoLONE acetate (PRED FORTE) 1 % ophthalmic suspension Place 1 drop into the left eye in the morning, at noon, in the evening, and at bedtime. (Patient not taking: Reported on 03/23/2022)     Current Facility-Administered Medications  Medication Dose Route Frequency Provider Last Rate Last Admin   0.9 %  sodium chloride infusion  500 mL Intravenous Once Jackquline Denmark, MD       0.9 %  sodium chloride infusion  500 mL Intravenous Once Jackquline Denmark, MD        Allergies  Allergen Reactions   Penicillins Rash    Has patient had a PCN reaction causing immediate rash, facial/tongue/throat swelling, SOB or lightheadedness with hypotension: Yes Has patient had a PCN reaction causing severe rash involving mucus membranes or skin necrosis: No Has patient had a PCN reaction that required hospitalization: No Has patient had a PCN reaction occurring within the last 10 years: No If all of the above answers are "NO",  then may proceed with Cephalosporin use.    Codeine Other (See Comments)    Dizziness and lethargy    Aciphex [Rabeprazole] Rash   Guaifenesin & Derivatives Rash   Latex Rash    Usually from adhesive tapes   Other Rash    Tape cannot be on the skin for an extended period of time   Pseudoephedrine Rash   Sulfa Antibiotics Rash   Sulfasalazine Rash   Tape Rash    Cannot be on the skin for an extended period of time    Review of Systems:  neg     Physical Exam:    BP 134/79   Pulse 62   Temp 98 F (36.7 C) (Temporal)   Ht '4\' 11"'$  (1.499 m)   Wt 151 lb (68.5 kg)   LMP  (LMP Unknown)   SpO2 97%   BMI 30.50 kg/m  Filed Weights   03/23/22 1034  Weight: 151 lb (68.5 kg)  Gen: awake, alert, NAD HEENT: anicteric, no pallor CV: RRR, no mrg Pulm: CTA b/l Abd: soft, NT/ND, +BS throughout Ext: no c/c/e Neuro: nonfocal       Carmell Austria, MD 03/23/2022, 11:16 AM  Cc: Madison Hickman, FNP

## 2022-03-23 NOTE — Op Note (Signed)
[name] Patient Name: [name] Procedure Date: 03/23/2022 11:11 AM MRN: [id number] Endoscopist: Jackquline Denmark , MD Age: 66 Referring MD:  Date of Birth: 1955/10/21 Gender: Female Account #: [id number] Procedure:                Upper GI endoscopy Indications:              Dysphagia Medicines:                Monitored Anesthesia Care Procedure:                Pre-Anesthesia Assessment:                           - Prior to the procedure, a History and Physical                            was performed, and patient medications and                            allergies were reviewed. The patient's tolerance of                            previous anesthesia was also reviewed. The risks                            and benefits of the procedure and the sedation                            options and risks were discussed with the patient.                            All questions were answered, and informed consent                            was obtained. Prior Anticoagulants: The patient has                            taken no previous anticoagulant or antiplatelet                            agents. ASA Grade Assessment: II - A patient with                            mild systemic disease. After reviewing the risks                            and benefits, the patient was deemed in                            satisfactory condition to undergo the procedure.                           After obtaining informed consent, the endoscope was  passed under direct vision. Throughout the                            procedure, the patient's blood pressure, pulse, and                            oxygen saturations were monitored continuously. The                            Endoscope was introduced through the mouth, and                            advanced to the second part of duodenum. The upper                            GI endoscopy was accomplished without  difficulty.                            The patient tolerated the procedure well. Scope In: Scope Out: Findings:                 The examined esophagus was significantly tortuous                            and foreshortened d/t HH. Biopsies were obtained                            from the proximal and distal esophagus with cold                            forceps for histology of suspected eosinophilic                            esophagitis.                           A moderate muscular Schatzki ring was found at the                            gastroesophageal junction, 32 cm from the incisors                            with luminal diameter of approximately 12 mm. The                            scope was withdrawn. Dilation was performed with a                            Maloney dilator with mild resistance at 15 Fr and                            54 Fr.                           A 6 cm  hiatal hernia was present (extending from 32                            cm up to 38 cm). No [place] erosions.                           Diffuse minimal inflammation characterized by                            erythema was found in the gastric antrum. Biopsies                            were taken with a cold forceps for histology.                           The examined duodenum was normal. Complications:            No immediate complications. Estimated Blood Loss:     Estimated blood loss: none. Impression:               - Moderate Schatzki ring. Dilated.                           - Large hiatal hernia.                           - Gastritis. Biopsied. Recommendation:           - Patient has a contact number available for                            emergencies. The signs and symptoms of potential                            delayed complications were discussed with the                            patient. Return to normal activities tomorrow.                            Written discharge instructions were  provided to the                            patient.                           - Post dilatation diet.                           - Continue present medications.                           - Await pathology results.                           - The findings and recommendations were discussed  with the patient's family.                           - Rpt dil PRN. Jackquline Denmark, MD 03/23/2022 11:39:50 AM This report has been signed electronically.

## 2022-03-23 NOTE — Patient Instructions (Signed)
- Patient has a contact number available for emergencies. The signs and symptoms of potential delayed complications were discussed with the patient. Return to normal activities tomorrow. Written discharge instructions were provided to the patient. - Post dilatation diet. - Continue present medications. - Await pathology results. - The findings and recommendations were discussed with the patient's family. - Repeat dilatation as needed.  Handout on post dilatation diet given.  YOU HAD AN ENDOSCOPIC PROCEDURE TODAY AT [facility]:   Refer to the procedure report that was given to you for any specific questions about what was found during the examination.  If the procedure report does not answer your questions, please call your gastroenterologist to clarify.  If you requested that your care partner not be given the details of your procedure findings, then the procedure report has been included in a sealed envelope for you to review at your convenience later.  YOU SHOULD EXPECT: Some feelings of bloating in the abdomen. Passage of more gas than usual.  Walking can help get rid of the air that was put into your GI tract during the procedure and reduce the bloating. If you had a lower endoscopy (such as a colonoscopy or flexible sigmoidoscopy) you may notice spotting of blood in your stool or on the toilet paper. If you underwent a bowel prep for your procedure, you may not have a normal bowel movement for a few days.  Please Note:  You might notice some irritation and congestion in your nose or some drainage.  This is from the oxygen used during your procedure.  There is no need for concern and it should clear up in a day or so.  SYMPTOMS TO REPORT IMMEDIATELY:  Following upper endoscopy (EGD)  Vomiting of blood or coffee ground material  New chest pain or pain under the shoulder blades  Painful or persistently difficult swallowing  New shortness of breath  Fever of 100F or  higher  Black, tarry-looking stools  For urgent or emergent issues, a gastroenterologist can be reached at any hour by calling [phone]. Do not use MyChart messaging for urgent concerns.    DIET:  We do recommend a small meal at first, but then you may proceed to your regular diet.  Drink plenty of fluids but you should avoid alcoholic beverages for 24 hours.  ACTIVITY:  You should plan to take it easy for the rest of today and you should NOT DRIVE or use heavy machinery until tomorrow (because of the sedation medicines used during the test).    FOLLOW UP: Our staff will call the number listed on your records the next business day following your procedure.  We will call around 7:15- 8:00 am to check on you and address any questions or concerns that you may have regarding the information given to you following your procedure. If we do not reach you, we will leave a message.     If any biopsies were taken you will be contacted by phone or by letter within the next 1-3 weeks.  Please call us at [phone] if you have not heard about the biopsies in 3 weeks.    SIGNATURES/CONFIDENTIALITY: You and/or your care partner have signed paperwork which will be entered into your electronic medical record.  These signatures attest to the fact that that the information above on your After Visit Summary has been reviewed and is understood.  Full responsibility of the confidentiality of this discharge information lies with you and/or your  care-partner.  

## 2022-03-24 ENCOUNTER — Telehealth: Payer: Self-pay | Admitting: Gastroenterology

## 2022-03-24 ENCOUNTER — Telehealth: Payer: Self-pay

## 2022-03-24 NOTE — Telephone Encounter (Signed)
Patient called states she is having chest pain, and would like to know what to do. Had a procedure yesterday. Please call to advise.

## 2022-03-24 NOTE — Telephone Encounter (Signed)
  Follow up Call-     03/23/2022   10:35 AM 12/02/2020    9:22 AM 05/14/2020    8:10 AM  Call back number  Post procedure Call Back phone  # [phone]  Permission to leave phone message Yes Yes Yes     Patient questions:  Do you have a fever, pain , or abdominal swelling? No. Pain Score  0 *  Have you tolerated food without any problems? Yes.    Have you been able to return to your normal activities? Yes.    Do you have any questions about your discharge instructions: Diet   No. Medications  No. Follow up visit  No.  Do you have questions or concerns about your Care? No.  Actions: * If pain score is 4 or above: No action needed, pain <4.

## 2022-03-24 NOTE — Telephone Encounter (Signed)
Returned patient call.  Patient c/o "chest pain" states when she drinks or turns a certain way it "feels like a catch" but describes it as steady.  She denies SOB.  Encouraged patient to err on the side of caution and go to ER for evaluation.  Patient verbalized understanding.

## 2022-03-25 NOTE — Telephone Encounter (Signed)
Left message for Jocelyn Lamer and her daughter Jenny Reichmann on their cell phones. RG

## 2022-03-28 ENCOUNTER — Encounter: Payer: Self-pay | Admitting: Gastroenterology

## 2022-04-16 ENCOUNTER — Other Ambulatory Visit: Payer: Self-pay | Admitting: Cardiology

## 2022-05-18 ENCOUNTER — Other Ambulatory Visit: Payer: Self-pay | Admitting: Cardiology

## 2022-06-01 DIAGNOSIS — F331 Major depressive disorder, recurrent, moderate: Secondary | ICD-10-CM | POA: Diagnosis not present

## 2022-06-01 DIAGNOSIS — K219 Gastro-esophageal reflux disease without esophagitis: Secondary | ICD-10-CM | POA: Diagnosis not present

## 2022-06-01 DIAGNOSIS — I10 Essential (primary) hypertension: Secondary | ICD-10-CM | POA: Diagnosis not present

## 2022-06-01 DIAGNOSIS — E782 Mixed hyperlipidemia: Secondary | ICD-10-CM | POA: Diagnosis not present

## 2022-06-01 DIAGNOSIS — F411 Generalized anxiety disorder: Secondary | ICD-10-CM | POA: Diagnosis not present

## 2022-06-01 DIAGNOSIS — G8929 Other chronic pain: Secondary | ICD-10-CM | POA: Diagnosis not present

## 2022-06-01 DIAGNOSIS — R7303 Prediabetes: Secondary | ICD-10-CM | POA: Diagnosis not present

## 2022-06-01 DIAGNOSIS — C188 Malignant neoplasm of overlapping sites of colon: Secondary | ICD-10-CM | POA: Diagnosis not present

## 2022-06-01 DIAGNOSIS — I25119 Atherosclerotic heart disease of native coronary artery with unspecified angina pectoris: Secondary | ICD-10-CM | POA: Diagnosis not present

## 2022-06-01 DIAGNOSIS — R1310 Dysphagia, unspecified: Secondary | ICD-10-CM | POA: Diagnosis not present

## 2022-06-01 DIAGNOSIS — I7121 Aneurysm of the ascending aorta, without rupture: Secondary | ICD-10-CM | POA: Diagnosis not present

## 2022-06-01 DIAGNOSIS — F5101 Primary insomnia: Secondary | ICD-10-CM | POA: Diagnosis not present

## 2022-06-02 DIAGNOSIS — I10 Essential (primary) hypertension: Secondary | ICD-10-CM | POA: Insufficient documentation

## 2022-06-02 HISTORY — DX: Essential (primary) hypertension: I10

## 2022-06-11 DIAGNOSIS — J0100 Acute maxillary sinusitis, unspecified: Secondary | ICD-10-CM | POA: Diagnosis not present

## 2022-06-21 DIAGNOSIS — J069 Acute upper respiratory infection, unspecified: Secondary | ICD-10-CM | POA: Diagnosis not present

## 2022-07-09 ENCOUNTER — Telehealth: Payer: Self-pay | Admitting: Gastroenterology

## 2022-07-09 MED ORDER — FAMOTIDINE 20 MG PO TABS: 20.0000 mg | ORAL_TABLET | Freq: Every day | ORAL | 4 refills | Status: DC

## 2022-07-09 NOTE — Telephone Encounter (Signed)
Sent to Mail order pharmacy

## 2022-07-09 NOTE — Telephone Encounter (Signed)
Patient's famotidine needs to be transferred to [facility] [phone]

## 2022-08-13 DIAGNOSIS — I10 Essential (primary) hypertension: Secondary | ICD-10-CM | POA: Diagnosis not present

## 2022-08-13 DIAGNOSIS — E782 Mixed hyperlipidemia: Secondary | ICD-10-CM | POA: Diagnosis not present

## 2022-08-13 DIAGNOSIS — R7303 Prediabetes: Secondary | ICD-10-CM | POA: Diagnosis not present

## 2022-08-16 DIAGNOSIS — E782 Mixed hyperlipidemia: Secondary | ICD-10-CM | POA: Diagnosis not present

## 2022-08-16 DIAGNOSIS — F5104 Psychophysiologic insomnia: Secondary | ICD-10-CM | POA: Diagnosis not present

## 2022-08-16 DIAGNOSIS — G629 Polyneuropathy, unspecified: Secondary | ICD-10-CM | POA: Diagnosis not present

## 2022-08-16 DIAGNOSIS — I10 Essential (primary) hypertension: Secondary | ICD-10-CM | POA: Diagnosis not present

## 2022-08-16 DIAGNOSIS — K219 Gastro-esophageal reflux disease without esophagitis: Secondary | ICD-10-CM | POA: Diagnosis not present

## 2022-09-22 ENCOUNTER — Inpatient Hospital Stay: Payer: Medicare HMO | Attending: Oncology

## 2022-09-22 ENCOUNTER — Telehealth: Payer: Self-pay | Admitting: Oncology

## 2022-09-22 DIAGNOSIS — Z85038 Personal history of other malignant neoplasm of large intestine: Secondary | ICD-10-CM | POA: Insufficient documentation

## 2022-09-22 DIAGNOSIS — K449 Diaphragmatic hernia without obstruction or gangrene: Secondary | ICD-10-CM | POA: Diagnosis not present

## 2022-09-22 DIAGNOSIS — C182 Malignant neoplasm of ascending colon: Secondary | ICD-10-CM

## 2022-09-22 DIAGNOSIS — Z79899 Other long term (current) drug therapy: Secondary | ICD-10-CM | POA: Insufficient documentation

## 2022-09-22 DIAGNOSIS — Z7982 Long term (current) use of aspirin: Secondary | ICD-10-CM | POA: Insufficient documentation

## 2022-09-22 DIAGNOSIS — R970 Elevated carcinoembryonic antigen [CEA]: Secondary | ICD-10-CM | POA: Diagnosis not present

## 2022-09-22 NOTE — Telephone Encounter (Signed)
Patient has been scheduled. Aware of appt date and time   Scheduling Message Entered by Gery Pray H on 09/21/2022 at  5:54 PM Priority: Routine <No visit type provided>  Department: [facility]  Provider:  Scheduling Notes:  She was supposed to have repeat CEA drawn this week, I sent message 6 months ago, who knows where that is.  Pls schedule lab appt

## 2022-09-24 LAB — CEA: CEA: 8.3 ng/mL — ABNORMAL HIGH (ref 0.0–4.7)

## 2022-09-28 ENCOUNTER — Other Ambulatory Visit: Payer: Self-pay | Admitting: Oncology

## 2022-09-28 ENCOUNTER — Telehealth: Payer: Self-pay | Admitting: Oncology

## 2022-09-28 DIAGNOSIS — C182 Malignant neoplasm of ascending colon: Secondary | ICD-10-CM

## 2022-09-28 NOTE — Telephone Encounter (Signed)
Patient has been scheduled. Aware of appt dates, times and instructions.     Status Change Notification  The following message was taken by Thomasena Edis. Scheduling Message Entered by Gery Pray H on 09/28/2022 at  9:05 AM Priority: High <No visit type provided>  Department: [facility]  Provider:  Scheduling Notes:  I called her that CEA increased from 5.4 to 8.3 so I rec CT chest/abd/pelvis & then f/u with me.  If we could get scan this week, I have openings on Thur and Friday

## 2022-09-30 DIAGNOSIS — K5730 Diverticulosis of large intestine without perforation or abscess without bleeding: Secondary | ICD-10-CM | POA: Diagnosis not present

## 2022-09-30 DIAGNOSIS — C182 Malignant neoplasm of ascending colon: Secondary | ICD-10-CM | POA: Diagnosis not present

## 2022-09-30 DIAGNOSIS — R918 Other nonspecific abnormal finding of lung field: Secondary | ICD-10-CM | POA: Diagnosis not present

## 2022-09-30 DIAGNOSIS — R911 Solitary pulmonary nodule: Secondary | ICD-10-CM | POA: Diagnosis not present

## 2022-09-30 DIAGNOSIS — K7689 Other specified diseases of liver: Secondary | ICD-10-CM | POA: Diagnosis not present

## 2022-09-30 DIAGNOSIS — I700 Atherosclerosis of aorta: Secondary | ICD-10-CM | POA: Diagnosis not present

## 2022-09-30 DIAGNOSIS — K449 Diaphragmatic hernia without obstruction or gangrene: Secondary | ICD-10-CM | POA: Diagnosis not present

## 2022-09-30 LAB — CBC AND DIFFERENTIAL
HCT: 37 (ref 36–46)
Hemoglobin: 12.6 (ref 12.0–16.0)
Neutrophils Absolute: 3.81
Platelets: 185 10*3/uL (ref 150–400)
WBC: 6.8

## 2022-09-30 LAB — HEPATIC FUNCTION PANEL
ALT: 23 U/L (ref 7–35)
AST: 32 (ref 13–35)
Alkaline Phosphatase: 95 (ref 25–125)
Bilirubin, Total: 0.5

## 2022-09-30 LAB — BASIC METABOLIC PANEL WITH GFR
BUN: 8 (ref 4–21)
Creatinine: 1 (ref 0.5–1.1)
Potassium: 3.6 meq/L (ref 3.5–5.1)

## 2022-09-30 LAB — BASIC METABOLIC PANEL
CO2: 26 — AB (ref 13–22)
Chloride: 104 (ref 99–108)
EGFR: 55
Glucose: 90
Sodium: 135 — AB (ref 137–147)

## 2022-09-30 LAB — COMPREHENSIVE METABOLIC PANEL
Albumin: 4.1 (ref 3.5–5.0)
Calcium: 9.1 (ref 8.7–10.7)

## 2022-09-30 LAB — CBC: RBC: 4.09 (ref 3.87–5.11)

## 2022-09-30 NOTE — Progress Notes (Signed)
[facility]  [address] [place],  [place]  [address] [phone]  Clinic Day: 10/01/2022  Referring physician: Jerrye Bushy, FNP  CHIEF COMPLAINT:  CC:  History of stage IIIB colon cancer with mild elevation of the CEA  Current Treatment:   Observation  HISTORY OF PRESENT ILLNESS:  [name] is a 67 y.o. 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  She was treated with surgical resection.  Pathology revealed a 3.6 centimeter, grade 2, adenocarcinoma with 3 of 45 nodes positive for metastasis.  She received adjuvant chemotherapy with FOLFOX for 5 cycles.  Oxaliplatin was then discontinued because of severe cytopenias.  She continued 5 fluorouracil and leucovorin for 4 more cycles, but then developed a severe allergic reaction to the 5-FU.  She was then placed on Xeloda for 6 weeks to complete a full 6 months of adjuvant chemotherapy.  She also had difficulty tolerating the Xeloda.  She has not had evidence of recurrence.  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  The patient is a nonsmoker.  She had a CT abdomen/pelvis in June 2016 for abdominal pain, but this was negative other than a small hiatal hernia.  MRI of the lumbar spine in July 2019 revealed left sided neural impingement at L1-2 with multilevel spondylosis. She had spinal surgery with Dr. Precious Gilding.  Her CEA was mildly elevated in April 2021 at 5.5. CT chest, abdomen and pelvis at that time did not reveal any evidence of malignancy. The CEA was 5 in July, then 5.2 in October. Colonoscopy with Dr. Chales Abrahams in December 2021 was unremarkable. Repeat in 5 years was recommended.   The CEA went up to 7.5 in January 2022, and went down to 7.0 in February. CT chest, abdomen and pelvis  from February did not reveal any evidence of local recurrence of metastatic disease. There was a stable moderate to large hiatal hernia noted.  The CEA was down to 5.6 in May, then 5.2 in August. She states she has 40% coronary artery blockage, for which she is on aspirin 81 mg daily, metoprolol 25 mg twice daily, nitroglycerin prn, and rosuvastatin 20 mg daily through Dr. Bing Matter. CT chest, abdomen and pelvis from November 9th of 2022 revealed no findings to suggest metastatic disease. CEA went back up to 5.9 in November of 2022. CEA was 4.5 in April 2023, the best it's been, and first normal reading since January of 2022. She has never been a smoker.  INTERVAL HISTORY:  [name] is here for routine follow up for her history of stage IIIB colon cancer with mild elevation of the CEA. Patient states that she feels nervous about her CT results and complains of sciatica pain in her right hip/leg. Her CEA was mildly elevated in November, 2022 at 5.9 and came back to normal by April, 2023 at 4.5. It then went back up in September to 5.3 and remained stable in October at 5.4. It has now increased to 8.3 in April 2024 and so CT of chest, abdomen, and pelvis were done. Her CT on 09/30/2022 revealed no evidence of recurrent or metastatic disease. However, there is new clustered nodularity, posteriorly in the lingula with nodules up to 10mm and 13mm. She has a moderate sized  hiatal hernia and describes aspiration occurring periodically, so I wonder if that is the cause of these lung changes.  It was recommended to have a repeat CT chest in 3 months. She informed me that she doesn't have any symptoms of having an infection but has had a lingering cough for the past couple of months. I reviewed the CT images with her and explained what we saw. I also gave her a copy of all the reports. I don't believe this to be cancer although her blood levels have been abnormal.  She will come back in 3 months with CBC, CMP, CEA, and CT of  the chest on July, 16th and I will see her back on July, 18th. She denies signs of infection such as sore throat, sinus drainage, cough, or urinary symptoms.  She denies fevers or recurrent chills. She denies pain. She denies nausea, vomiting, chest pain, dyspnea or cough. Her appetite is good and her weight has decreased 9 pounds over last 7 months .  REVIEW OF SYSTEMS:  Review of Systems  Constitutional: Negative.  Negative for appetite change, chills, diaphoresis, fatigue, fever and unexpected weight change.  HENT:  Negative.  Negative for hearing loss, lump/mass, mouth sores, nosebleeds, sore throat, tinnitus, trouble swallowing and voice change.   Eyes: Negative.  Negative for eye problems and icterus.  Respiratory: Negative.  Negative for chest tightness, cough, hemoptysis, shortness of breath and wheezing.   Cardiovascular: Negative.  Negative for chest pain, leg swelling and palpitations.  Gastrointestinal: Negative.  Negative for abdominal distention, abdominal pain, blood in stool, constipation, diarrhea, nausea, rectal pain and vomiting.       Acid reflux  Endocrine: Negative.   Genitourinary: Negative.  Negative for bladder incontinence, difficulty urinating, dyspareunia, dysuria, frequency, hematuria, menstrual problem, nocturia, pelvic pain, vaginal bleeding and vaginal discharge.   Musculoskeletal:  Negative for arthralgias, back pain, flank pain, gait problem, myalgias, neck pain and neck stiffness.       Sciatica pain in her right hip/leg  Skin: Negative.  Negative for itching, rash and wound.  Neurological: Negative.  Negative for dizziness, extremity weakness, gait problem, headaches, light-headedness, numbness, seizures and speech difficulty.  Hematological: Negative.  Negative for adenopathy. Does not bruise/bleed easily.  Psychiatric/Behavioral:  Negative for confusion, decreased concentration, depression, sleep disturbance and suicidal ideas. The patient is nervous/anxious.        VITALS:  Blood pressure (!) 148/90, pulse 75, temperature 98.1 F (36.7 C), temperature source Oral, resp. rate 16, height 4\' 11"  (1.499 m), weight 163 lb 8 oz (74.2 kg), SpO2 100 %.  Wt Readings from Last 3 Encounters:  10/01/22 163 lb 8 oz (74.2 kg)  03/23/22 151 lb (68.5 kg)  03/08/22 160 lb 14.4 oz (73 kg)    Body mass index is 33.02 kg/m.  Performance status (ECOG): 0 - Asymptomatic  PHYSICAL EXAM:  Physical Exam Vitals and nursing note reviewed. Exam conducted with a chaperone present.  Constitutional:      General: She is not in acute distress.    Appearance: Normal appearance. She is normal weight. She is not ill-appearing, toxic-appearing or diaphoretic.  HENT:     Head: Normocephalic and atraumatic.     Right Ear: Tympanic membrane, ear canal and external ear normal. There is no impacted cerumen.     Left Ear: Tympanic membrane, ear canal and external ear normal. There is no impacted cerumen.     Nose: Nose normal. No congestion or rhinorrhea.     Mouth/Throat:  Mouth: Mucous membranes are moist.     Pharynx: Oropharynx is clear. No oropharyngeal exudate or posterior oropharyngeal erythema.  Eyes:     General: No scleral icterus.       Right eye: No discharge.        Left eye: No discharge.     Extraocular Movements: Extraocular movements intact.     Conjunctiva/sclera: Conjunctivae normal.     Pupils: Pupils are equal, round, and reactive to light.  Neck:     Vascular: No carotid bruit.  Cardiovascular:     Rate and Rhythm: Normal rate and regular rhythm.     Pulses: Normal pulses.     Heart sounds: Normal heart sounds. No murmur heard.    No friction rub. No gallop.  Pulmonary:     Effort: Pulmonary effort is normal. No respiratory distress.     Breath sounds: Normal breath sounds. No stridor. No wheezing, rhonchi or rales.  Chest:     Chest wall: No tenderness.  Abdominal:     General: Bowel sounds are normal. There is no distension.      Palpations: Abdomen is soft. There is no hepatomegaly, splenomegaly or mass.     Tenderness: There is no abdominal tenderness. There is no right CVA tenderness, left CVA tenderness, guarding or rebound.     Hernia: No hernia is present.  Musculoskeletal:        General: No swelling, tenderness, deformity or signs of injury. Normal range of motion.     Cervical back: Normal range of motion and neck supple. No rigidity or tenderness.     Right lower leg: No edema.     Left lower leg: No edema.  Lymphadenopathy:     Cervical: No cervical adenopathy.  Skin:    General: Skin is warm and dry.     Coloration: Skin is not jaundiced or pale.     Findings: No bruising, erythema, lesion or rash.  Neurological:     General: No focal deficit present.     Mental Status: She is alert and oriented to person, place, and time. Mental status is at baseline.     Cranial Nerves: No cranial nerve deficit.     Sensory: No sensory deficit.     Motor: No weakness.     Coordination: Coordination normal.     Gait: Gait normal.     Deep Tendon Reflexes: Reflexes normal.  Psychiatric:        Mood and Affect: Mood normal.        Behavior: Behavior normal.        Thought Content: Thought content normal.        Judgment: Judgment normal.     LABS:   Component Ref Range & Units 08/13/2022  WBC 4.4 - 11.0 x 10*3/uL 8.5  RBC 4.10 - 5.10 x 10*6/uL 4.28  Hemoglobin 12.3 - 15.3 G/DL 16.1  Hematocrit 09.6 - 44.6 % 39.4  MCV 80.0 - 96.0 FL 92.2  MCH 27.5 - 33.2 PG 30.6  MCHC 33.0 - 37.0 G/DL 04.5  RDW 40.9 - 81.1 % 14.6  Platelets 150 - 450 X 10*3/uL 177  MPV 6.8 - 10.2 FL 10.4 High    Component Ref Range & Units 08/13/2022  Sodium 135 - 146 MMOL/L 140  Potassium 3.5 - 5.3 MMOL/L 4.5  Chloride 98 - 110 MMOL/L 106  CO2 21 - 31 MMOL/L 27  BUN 8 - 24 MG/DL 12  Glucose 70 - 99 MG/DL 93  Creatinine 9.14 -  1.20 MG/DL 1.61  Calcium 8.5 - 09.6 MG/DL 9.7  Total Protein 6.4 - 8.9 G/DL 5.9  Low   Albumin 3.5 - 5.7 G/DL 3.9  Total Bilirubin 0.0 - 1.0 MG/DL 0.6  Alkaline Phosphatase 34 - 104 IU/L or U/L 77  AST (SGOT) 13 - 39 IU/L or U/L 18  ALT (SGPT) 7 - 52 IU/L or U/L 18   Component Ref Range & Units 08/13/2022  HEMOGLOBIN A1C <5.8 % 5.8 High   eAG MG/DL 045   Component Ref Range & Units 08/13/2022  LDL Direct <100 mg/dL 37  Total Cholesterol <409 MG/DL 811  Triglycerides <914 MG/DL 60  HDL Cholesterol >=78 MG/DL 77  Total Chol / HDL Cholesterol <4.5 1.8  Non-HDL Cholesterol MG/DL 65   Component Ref Range & Units 08/13/2022  TSH 0.45 - 5.00 UIU/ML 2.33      Latest Ref Rng & Units 09/30/2022   12:00 AM 03/08/2022   12:00 AM 10/02/2021   12:00 AM  CBC  WBC  6.8     5.1     6.0      Hemoglobin 12.0 - 16.0 12.6     12.7     12.5      Hematocrit 36 - 46 37     38     39      Platelets 150 - 400 K/uL 185     180     171         This result is from an external source.      Latest Ref Rng & Units 09/30/2022   12:00 AM 03/08/2022   12:00 AM 12/08/2021    9:56 AM  CMP  Glucose 70 - 99 mg/dL   295   BUN 4 - 21 8     10     13    Creatinine 0.5 - 1.1 1.0     1.1     1.20   Sodium 137 - 147 135     133     138   Potassium 3.5 - 5.1 mEq/L 3.6     4.3     4.0   Chloride 99 - 108 104     105     101   CO2 13 - 22 26     24     21    Calcium 8.7 - 10.7 9.1     9.9     9.7   Alkaline Phos 25 - 125 95     93       AST 13 - 35 32     32       ALT 7 - 35 U/L 23     21          This result is from an external source.   Lab Results  Component Value Date   CEA1 8.3 (H) 09/22/2022   /  CEA  Date Value Ref Range Status  09/22/2022 8.3 (H) 0.0 - 4.7 ng/mL Final    Comment:    (NOTE)                             Nonsmokers          <3.9                             Smokers             <  5.6 [employer] Electrochemiluminescence Immunoassay (ECLIA) Values obtained with different assay methods or kits cannot be used interchangeably.  Results cannot  be interpreted as absolute evidence of the presence or absence of malignant disease. Performed At: [facility] [address] [place], [place] [id number] Jolene Schimke MD WU:[id number]    STUDIES:  No studies found  HISTORY:   Allergies:  Allergies  Allergen Reactions   Penicillins Rash    Has patient had a PCN reaction causing immediate rash, facial/tongue/throat swelling, SOB or lightheadedness with hypotension: Yes Has patient had a PCN reaction causing severe rash involving mucus membranes or skin necrosis: No Has patient had a PCN reaction that required hospitalization: No Has patient had a PCN reaction occurring within the last 10 years: No If all of the above answers are "NO", then may proceed with Cephalosporin use.    Codeine Other (See Comments)    Dizziness and lethargy   Aciphex [Rabeprazole] Rash   Guaifenesin & Derivatives Rash   Latex Rash    Usually from adhesive tapes   Other Rash    Tape cannot be on the skin for an extended period of time   Pseudoephedrine Rash   Sulfa Antibiotics Rash   Sulfasalazine Rash   Tape Rash    Cannot be on the skin for an extended period of time    Current Medications: Current Outpatient Medications  Medication Sig Dispense Refill   aspirin EC 81 MG tablet Take 81 mg by mouth daily.     B Complex-Folic Acid (B COMPLEX-VITAMIN B12 PO) Take 1 tablet by mouth daily. Unknown strength     CALCIUM PO Take 2 tablets by mouth daily. Unknown strength     Cholecalciferol (VITAMIN D3) 2000 units TABS Take 2,000 Units by mouth daily.     CINNAMON PO Take 1 tablet by mouth daily. Unknown strength     dexlansoprazole (DEXILANT) 60 MG capsule Take 1 capsule (60 mg total) by mouth daily. 90 capsule 4   diltiazem (CARDIZEM CD) 120 MG 24 hr capsule Take 1 capsule by mouth once daily 90 capsule 1   DULoxetine (CYMBALTA) 60 MG capsule Take 60 mg by mouth daily.  1   famotidine (PEPCID) 20 MG tablet Take 1 tablet (20 mg total) by  mouth at bedtime. 90 tablet 4   furosemide (LASIX) 20 MG tablet Take 1 tablet (20 mg total) by mouth daily. 90 tablet 3   gabapentin (NEURONTIN) 800 MG tablet Take 800 mg by mouth 3 (three) times daily.     meloxicam (MOBIC) 15 MG tablet Take 15 mg by mouth daily.     metoprolol succinate (TOPROL-XL) 25 MG 24 hr tablet Take 25 mg by mouth 2 (two) times daily.     Multiple Vitamins-Minerals (CENTRUM SILVER 50+WOMEN) TABS Take 1 tablet by mouth daily. Unknown strength     nitroGLYCERIN (NITROSTAT) 0.4 MG SL tablet Place 1 tablet (0.4 mg total) under the tongue every 5 (five) minutes x 3 doses as needed for chest pain. 25 tablet 2   ofloxacin (OCUFLOX) 0.3 % ophthalmic solution Place 1 drop into the left eye 4 (four) times daily.     prednisoLONE acetate (PRED FORTE) 1 % ophthalmic suspension Place 1 drop into the left eye in the morning, at noon, in the evening, and at bedtime. (Patient not taking: Reported on 03/23/2022)     RABEprazole (ACIPHEX) 20 MG tablet Take 20 mg by mouth daily.     ranolazine (RANEXA) 500 MG 12 hr tablet  Take 1 tablet by mouth twice daily 180 tablet 2   rosuvastatin (CRESTOR) 20 MG tablet Take 20 mg by mouth at bedtime.     Current Facility-Administered Medications  Medication Dose Route Frequency Provider Last Rate Last Admin   0.9 %  sodium chloride infusion  500 mL Intravenous Once Lynann Bologna, MD       0.9 %  sodium chloride infusion  500 mL Intravenous Once Lynann Bologna, MD         ASSESSMENT & PLAN:  Assessment:   1. History of stage IIIB colon cancer diagnosed in December 2012.  The patient remains without obvious evidence of recurrence.  Colonoscopy in December 2021 was unremarkable and she will be due repeat examination in 2026.  We may need to consider doing this sooner if her CEA continues to rise.  2. Mild elevation of the CEA of uncertain etiology.  CT imaging in February and November 2022 did not reveal any evidence of malignancy.  CEA was in the  normal range in April 2023 but then went back up again a little in September and October.  However, it now increased to 8.3 in April, 2024.    3. Moderate to large hiatal hernia. Dr. Chales Abrahams performed an EGD in October, 2023 with findings of a 6cm hiatal hernia and diffuse inflammation.  4. Nodularity in the lingula noted in April, 2024. I wonder if this could represent aspiration pneumonia since it does not look typical for a primary lung cancer or a metastatic lesion.   5. 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 Precious Gilding.  Plan:   Her CEA was mildly elevated in November, 2022 at 5.9 and came back to normal by April, 2023 at 4.5. It then went back up in September to 5.3 and remained stable in October at 5.4. It has now increased to 8.3 and so CT of chest, abdomen, and pelvis were done. Her CT on 09/30/2022 revealed no evidence of recurrent or metastatic disease. However, there is new clustered nodularity, posteriorly in the lingula with nodules up to 10mm and 13mm. She has a moderate sized hiatal hernia and describes aspiration occurring periodically, so I wonder if that is the cause of these lung changes.  It was recommended to have a repeat CT chest in 3 months. She informed me that she doesn't have any symptoms of having an infection but has had a lingering cough for the past couple of months. I reviewed the CT images with her and explained what we saw. I also gave her a copy of all the reports. I don't believe this to be cancer although her blood levels have been abnormal.  She will come back in 3 months with CBC, CMP, CEA, and CT of the chest on July, 16th and I will see her back on July, 18th.  The patient understands the plans discussed today and is in agreement with them.  She knows to contact our office if she develops concerns prior to her next appointment.  I provided 20 minutes of face-to-face time during this this encounter and > 50% was  spent counseling as documented under my assessment and plan.   I,Jasmine M Lassiter,acting as a scribe for Dellia Beckwith, MD.,have documented all relevant documentation on the behalf of Dellia Beckwith, MD,as directed by  Dellia Beckwith, MD while in the presence of Dellia Beckwith, MD.

## 2022-10-01 ENCOUNTER — Encounter: Payer: Self-pay | Admitting: Oncology

## 2022-10-01 ENCOUNTER — Other Ambulatory Visit: Payer: Self-pay | Admitting: Oncology

## 2022-10-01 ENCOUNTER — Inpatient Hospital Stay (INDEPENDENT_AMBULATORY_CARE_PROVIDER_SITE_OTHER): Payer: Medicare HMO | Admitting: Oncology

## 2022-10-01 VITALS — BP 148/90 | HR 75 | Temp 98.1°F | Resp 16 | Ht 59.0 in | Wt 163.5 lb

## 2022-10-01 DIAGNOSIS — R978 Other abnormal tumor markers: Secondary | ICD-10-CM

## 2022-10-01 DIAGNOSIS — C182 Malignant neoplasm of ascending colon: Secondary | ICD-10-CM | POA: Diagnosis not present

## 2022-10-04 ENCOUNTER — Telehealth: Payer: Self-pay | Admitting: Oncology

## 2022-10-04 NOTE — Telephone Encounter (Signed)
Contacted pt to schedule an appt. Unable to reach via phone, voicemail was left.    Follow-Up Information  Follow-up disposition: Return in about 3 months (around 12/30/2022).  Check out comments: RT 3 months with labs and CT chest 2 days before

## 2022-10-19 ENCOUNTER — Encounter: Payer: Self-pay | Admitting: Oncology

## 2022-11-05 DIAGNOSIS — H524 Presbyopia: Secondary | ICD-10-CM | POA: Diagnosis not present

## 2022-11-12 ENCOUNTER — Other Ambulatory Visit: Payer: Self-pay | Admitting: Cardiology

## 2022-11-25 ENCOUNTER — Other Ambulatory Visit: Payer: Self-pay | Admitting: Cardiology

## 2022-12-06 ENCOUNTER — Telehealth: Payer: Self-pay | Admitting: Oncology

## 2022-12-06 NOTE — Telephone Encounter (Signed)
CT Chest has been scheduled for 12/28/22 @ 11:30; Checking in @ 10:30 am   LVM notifying pt of date,time and instructions.

## 2022-12-23 NOTE — Progress Notes (Signed)
[facility]  [address] [place],  [place]  [address] [phone]  Clinic Day: 12/30/22  Referring physician: Jerrye Bushy, FNP  CHIEF COMPLAINT:  CC:  History of stage IIIB colon cancer with mild elevation of the CEA  Current Treatment:   Observation  HISTORY OF PRESENT ILLNESS:  [name] is a 67 y.o. 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  She was treated with surgical resection.  Pathology revealed a 3.6 centimeter, grade 2, adenocarcinoma with 3 of 45 nodes positive for metastasis.  She received adjuvant chemotherapy with FOLFOX for 5 cycles.  Oxaliplatin was then discontinued because of severe cytopenias.  She continued 5 fluorouracil and leucovorin for 4 more cycles, but then developed a severe allergic reaction to the 5-FU.  She was then placed on Xeloda for 6 weeks to complete a full 6 months of adjuvant chemotherapy.  She also had difficulty tolerating the Xeloda.  She has not had evidence of recurrence.  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  The patient is a nonsmoker.  She had a CT abdomen/pelvis in June 2016 for abdominal pain, but this was negative other than a small hiatal hernia.  MRI of the lumbar spine in July 2019 revealed left sided neural impingement at L1-2 with multilevel spondylosis. She had spinal surgery with Dr. Precious Gilding.  Her CEA was mildly elevated in April 2021 at 5.5. CT chest, abdomen and pelvis at that time did not reveal any evidence of malignancy. The CEA was 5 in July, then 5.2 in October. Colonoscopy with Dr. Chales Abrahams in December 2021 was unremarkable. Repeat in 5 years was recommended.   The CEA went up to 7.5 in January 2022, and went down to 7.0 in February. CT chest, abdomen and pelvis  from February did not reveal any evidence of local recurrence of metastatic disease. There was a stable moderate to large hiatal hernia noted.  The CEA was down to 5.6 in May, then 5.2 in August. She states she has 40% coronary artery blockage, for which she is on aspirin 81 mg daily, metoprolol 25 mg twice daily, nitroglycerin prn, and rosuvastatin 20 mg daily through Dr. Bing Matter. CT chest, abdomen and pelvis from November 9th of 2022 revealed no findings to suggest metastatic disease. CEA went back up to 5.9 in November of 2022. CEA was 4.5 in April 2023, the best it's been, and first normal reading since January of 2022. She has never been a smoker.  INTERVAL HISTORY:  [name] is here for routine follow up for her remote history of stage IIIB colon cancer with mild elevation of the CEA. Patient states that she feels well and complains of numbness in her fingers and toes. She had a repeat CT of her chest on 12/28/2022 due to new clustered nodularity, posteriorly in the lingula with nodules up to 10mm and 13mm and the results are pending. Her WBC is 6.0, hemoglobin 12.6, and platelet count is 149,000.Marland Kitchen Her CMP is normal. Her CEA improved from 8.3 to 5.2 as of today. We will cancel her appointment in September and I will see her back in 6 months, around mid January, 2025 with CBC, CMP, and CEA 2 days before her appointment with me. She denies signs of  infection such as sore throat, sinus drainage, cough, or urinary symptoms.  She denies fevers or recurrent chills. She denies pain. She denies nausea, vomiting, chest pain, dyspnea or cough. Her appetite is good and her weight has increased 3 pounds over last 3 months .  REVIEW OF SYSTEMS:  Review of Systems  Constitutional: Negative.  Negative for appetite change, chills, diaphoresis, fatigue, fever and unexpected weight change.  HENT:  Negative.  Negative for hearing loss, lump/mass, mouth sores, nosebleeds, sore throat, tinnitus, trouble swallowing and  voice change.   Eyes: Negative.  Negative for eye problems and icterus.  Respiratory: Negative.  Negative for chest tightness, cough, hemoptysis, shortness of breath and wheezing.   Cardiovascular: Negative.  Negative for chest pain, leg swelling and palpitations.  Gastrointestinal: Negative.  Negative for abdominal distention, abdominal pain, blood in stool, constipation, diarrhea, nausea, rectal pain and vomiting.       Acid reflux  Endocrine: Negative.   Genitourinary: Negative.  Negative for bladder incontinence, difficulty urinating, dyspareunia, dysuria, frequency, hematuria, menstrual problem, nocturia, pelvic pain, vaginal bleeding and vaginal discharge.   Musculoskeletal:  Negative for arthralgias, back pain, flank pain, gait problem, myalgias, neck pain and neck stiffness.       Sciatica pain in her right hip/leg  Skin: Negative.  Negative for itching, rash and wound.  Neurological:  Positive for numbness (fingers/feet). Negative for dizziness, extremity weakness, gait problem, headaches, light-headedness, seizures and speech difficulty.  Hematological: Negative.  Negative for adenopathy. Does not bruise/bleed easily.  Psychiatric/Behavioral:  Negative for confusion, decreased concentration, depression, sleep disturbance and suicidal ideas. The patient is nervous/anxious.       VITALS:  Blood pressure 125/81, pulse 78, temperature 97.8 F (36.6 C), temperature source Oral, resp. rate 18, height 4\' 11"  (1.499 m), weight 166 lb (75.3 kg), SpO2 98%.  Wt Readings from Last 3 Encounters:  12/31/22 166 lb 6.4 oz (75.5 kg)  12/30/22 166 lb (75.3 kg)  10/01/22 163 lb 8 oz (74.2 kg)    Body mass index is 33.53 kg/m.  Performance status (ECOG): 0 - Asymptomatic  PHYSICAL EXAM:  Physical Exam Vitals and nursing note reviewed. Exam conducted with a chaperone present.  Constitutional:      General: She is not in acute distress.    Appearance: Normal appearance. She is normal weight.  She is not ill-appearing, toxic-appearing or diaphoretic.  HENT:     Head: Normocephalic and atraumatic.     Right Ear: Tympanic membrane, ear canal and external ear normal. There is no impacted cerumen.     Left Ear: Tympanic membrane, ear canal and external ear normal. There is no impacted cerumen.     Nose: Nose normal. No congestion or rhinorrhea.     Mouth/Throat:     Mouth: Mucous membranes are moist.     Pharynx: Oropharynx is clear. No oropharyngeal exudate or posterior oropharyngeal erythema.  Eyes:     General: No scleral icterus.       Right eye: No discharge.        Left eye: No discharge.     Extraocular Movements: Extraocular movements intact.     Conjunctiva/sclera: Conjunctivae normal.     Pupils: Pupils are equal, round, and reactive to light.  Neck:     Vascular: No carotid bruit.  Cardiovascular:     Rate and Rhythm: Normal rate and regular rhythm.     Pulses: Normal pulses.     Heart sounds: Normal heart sounds. No murmur heard.  No friction rub. No gallop.  Pulmonary:     Effort: Pulmonary effort is normal. No respiratory distress.     Breath sounds: Normal breath sounds. No stridor. No wheezing, rhonchi or rales.  Chest:     Chest wall: No tenderness.  Abdominal:     General: Bowel sounds are normal. There is no distension.     Palpations: Abdomen is soft. There is no hepatomegaly, splenomegaly or mass.     Tenderness: There is no abdominal tenderness. There is no right CVA tenderness, left CVA tenderness, guarding or rebound.     Hernia: No hernia is present.  Musculoskeletal:        General: No swelling, tenderness, deformity or signs of injury. Normal range of motion.     Cervical back: Normal range of motion and neck supple. No rigidity or tenderness.     Right lower leg: No edema.     Left lower leg: No edema.  Lymphadenopathy:     Cervical: No cervical adenopathy.  Skin:    General: Skin is warm and dry.     Coloration: Skin is not jaundiced or  pale.     Findings: No bruising, erythema, lesion or rash.  Neurological:     General: No focal deficit present.     Mental Status: She is alert and oriented to person, place, and time. Mental status is at baseline.     Cranial Nerves: No cranial nerve deficit.     Sensory: No sensory deficit.     Motor: No weakness.     Coordination: Coordination normal.     Gait: Gait normal.     Deep Tendon Reflexes: Reflexes normal.  Psychiatric:        Mood and Affect: Mood normal.        Behavior: Behavior normal.        Thought Content: Thought content normal.        Judgment: Judgment normal.     LABS:     Component Ref Range & Units 08/13/2022  HEMOGLOBIN A1C <5.8 % 5.8 High   eAG MG/DL 595   Component Ref Range & Units 08/13/2022  LDL Direct <100 mg/dL 37  Total Cholesterol <638 MG/DL 756  Triglycerides <433 MG/DL 60  HDL Cholesterol >=29 MG/DL 77  Total Chol / HDL Cholesterol <4.5 1.8  Non-HDL Cholesterol MG/DL 65   Component Ref Range & Units 08/13/2022  TSH 0.45 - 5.00 UIU/ML 2.33      Latest Ref Rng & Units 12/31/2022   12:00 AM 12/28/2022   12:00 AM 09/30/2022   12:00 AM  CBC  WBC 3.4 - 10.8 x10E3/uL 5.6  6.0     6.8      Hemoglobin 11.1 - 15.9 g/dL 51.8  84.1     66.0      Hematocrit 34.0 - 46.6 % 38.0  38     37      Platelets 150 - 450 x10E3/uL 152  149     185         This result is from an external source.      Latest Ref Rng & Units 12/31/2022   12:00 AM 12/28/2022   12:00 AM 09/30/2022   12:00 AM  CMP  Glucose 70 - 99 mg/dL 630     BUN 8 - 27 mg/dL 8  11     8       Creatinine 0.57 - 1.00 mg/dL 1.60  1.1     1.0  Sodium 134 - 144 mmol/L 143  139     135      Potassium 3.5 - 5.2 mmol/L 3.8  3.7     3.6      Chloride 96 - 106 mmol/L 106  105     104      CO2 20 - 29 mmol/L 24  29     26       Calcium 8.7 - 10.3 mg/dL 9.4  9.5     9.1      Alkaline Phos 25 - 125  84     95      AST 13 - 35  31     32      ALT 7 - 35 U/L  16     23          This result is from an external source.   Lab Results  Component Value Date   CEA1 8.3 (H) 09/22/2022   CEA 5.2 12/28/2022   /  CEA  Date Value Ref Range Status  12/28/2022 5.2  Final  09/22/2022 8.3 (H) 0.0 - 4.7 ng/mL Final    Comment:    (NOTE)                             Nonsmokers          <3.9                             Smokers             <5.6 [employer] Electrochemiluminescence Immunoassay (ECLIA) Values obtained with different assay methods or kits cannot be used interchangeably.  Results cannot be interpreted as absolute evidence of the presence or absence of malignant disease. Performed At: [facility] [address] [place], [place] [id number] Jolene Schimke MD XB:[id number]    STUDIES:     HISTORY:   Allergies:  Allergies  Allergen Reactions   Penicillins Rash    Has patient had a PCN reaction causing immediate rash, facial/tongue/throat swelling, SOB or lightheadedness with hypotension: Yes Has patient had a PCN reaction causing severe rash involving mucus membranes or skin necrosis: No Has patient had a PCN reaction that required hospitalization: No Has patient had a PCN reaction occurring within the last 10 years: No If all of the above answers are "NO", then may proceed with Cephalosporin use.    Codeine Other (See Comments)    Dizziness and lethargy   Aciphex [Rabeprazole] Rash   Guaifenesin & Derivatives Rash   Latex Rash    Usually from adhesive tapes   Other Rash    Tape cannot be on the skin for an extended period of time   Pseudoephedrine Rash   Sulfa Antibiotics Rash   Sulfasalazine Rash   Tape Rash    Cannot be on the skin for an extended period of time    Current Medications: Current Outpatient Medications  Medication Sig Dispense Refill   aspirin EC 81 MG tablet Take 81 mg by mouth daily.     B Complex-Folic Acid (B COMPLEX-VITAMIN B12 PO) Take 1 tablet by mouth daily. Unknown strength     CALCIUM PO Take 2  tablets by mouth daily. Unknown strength     Cholecalciferol (VITAMIN D3) 2000 units TABS Take 2,000 Units by mouth daily.     CINNAMON PO Take 1 tablet by mouth daily. Unknown  strength     dexlansoprazole (DEXILANT) 60 MG capsule Take 1 capsule (60 mg total) by mouth daily. 90 capsule 4   DULoxetine (CYMBALTA) 60 MG capsule Take 60 mg by mouth daily.  1   famotidine (PEPCID) 20 MG tablet Take 1 tablet (20 mg total) by mouth at bedtime. 90 tablet 4   furosemide (LASIX) 20 MG tablet Take 1 tablet (20 mg total) by mouth daily. 30 tablet 1   gabapentin (NEURONTIN) 800 MG tablet Take 800 mg by mouth 3 (three) times daily.     meloxicam (MOBIC) 15 MG tablet Take 15 mg by mouth daily.     Multiple Vitamins-Minerals (CENTRUM SILVER 50+WOMEN) TABS Take 1 tablet by mouth daily. Unknown strength     nitroGLYCERIN (NITROSTAT) 0.4 MG SL tablet Place 1 tablet (0.4 mg total) under the tongue every 5 (five) minutes x 3 doses as needed for chest pain. 25 tablet 2   ofloxacin (OCUFLOX) 0.3 % ophthalmic solution Place 1 drop into the left eye 4 (four) times daily.     prednisoLONE acetate (PRED FORTE) 1 % ophthalmic suspension Place 1 drop into the left eye in the morning, at noon, in the evening, and at bedtime.     RABEprazole (ACIPHEX) 20 MG tablet Take 20 mg by mouth daily.     rosuvastatin (CRESTOR) 20 MG tablet Take 20 mg by mouth at bedtime.     metoprolol succinate (TOPROL XL) 50 MG 24 hr tablet Take 1 tablet (50 mg total) by mouth 2 (two) times daily. Take with or immediately following a meal. 180 tablet 3   ranolazine (RANEXA) 500 MG 12 hr tablet Take 1 tablet by mouth twice daily 180 tablet 0   Current Facility-Administered Medications  Medication Dose Route Frequency Provider Last Rate Last Admin   0.9 %  sodium chloride infusion  500 mL Intravenous Once Lynann Bologna, MD       0.9 %  sodium chloride infusion  500 mL Intravenous Once Lynann Bologna, MD         ASSESSMENT & PLAN:  Assessment:   1.  History of stage IIIB colon cancer diagnosed in December 2012.  The patient remains without obvious evidence of recurrence.  Colonoscopy in December 2021 was unremarkable and she will be due repeat examination in 2026.   2. Mild elevation of the CEA of uncertain etiology.  CT imaging in February and November 2022 did not reveal any evidence of malignancy.  CEA was in the normal range in April 2023 but then went back up again a little in September and October.  However, it increased to 8.3 in April, 2024, but back down to 5.2 in July, 2024.    3. Moderate to large hiatal hernia. Dr. Chales Abrahams performed an EGD in October, 2023 with findings of a 6 cm hiatal hernia and diffuse inflammation.  4. Nodularity in the lingula noted in April, 2024. I wonder if this could represent aspiration pneumonia due to her large hiatal hernia and reflux, since it does not look typical for a primary lung cancer or a metastatic lesion.   5. 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 Precious Gilding.  Plan:   She had a repeat CT of her chest on 12/28/2022 due to new clustered nodularity, posteriorly in the lingula with nodules up to 10 mm and 13 mm and the results are pending. Her WBC is 6.0, hemoglobin 12.6, and platelet count is 149,000.Marland Kitchen Her CMP is normal.  Her CEA improved from 8.3 to 5.2 as of today. We will cancel her appointment in September and I will see her back in 6 months, around mid January, 2025 with CBC, CMP, and CEA 2 days before her appointment with me. If her CEA increases significantly again we will repeat CT scans. The patient understands the plans discussed today and is in agreement with them.  She knows to contact our office if she develops concerns prior to her next appointment.  I provided 15 minutes of face-to-face time during this this encounter and > 50% was spent counseling as documented under my assessment and plan.   I,Jasmine M Lassiter,acting as a scribe  for Dellia Beckwith, MD.,have documented all relevant documentation on the behalf of Dellia Beckwith, MD,as directed by  Dellia Beckwith, MD while in the presence of Dellia Beckwith, MD.

## 2022-12-27 ENCOUNTER — Telehealth: Payer: Self-pay | Admitting: Cardiology

## 2022-12-27 NOTE — Telephone Encounter (Signed)
Patient returned RN's call. 

## 2022-12-27 NOTE — Telephone Encounter (Signed)
Left message for the patient to call back.

## 2022-12-27 NOTE — Telephone Encounter (Signed)
Pt c/o swelling: STAT is pt has developed SOB within 24 hours  If swelling, where is the swelling located? Feet ankles, all the way up her legs  How much weight have you gained and in what time span?   Have you gained 3 pounds in a day or 5 pounds in a week?   Do you have a log of your daily weights (if so, list)?   Are you currently taking a fluid pill? yes  Are you currently SOB?  A little bit and not at this time  Have you traveled recently? No- patient wanted t o be seen - I made her appointment with [name] for Friday(12-31-22).- please call to  evaluate

## 2022-12-28 DIAGNOSIS — C189 Malignant neoplasm of colon, unspecified: Secondary | ICD-10-CM | POA: Diagnosis not present

## 2022-12-28 DIAGNOSIS — R978 Other abnormal tumor markers: Secondary | ICD-10-CM | POA: Diagnosis not present

## 2022-12-28 DIAGNOSIS — R911 Solitary pulmonary nodule: Secondary | ICD-10-CM | POA: Diagnosis not present

## 2022-12-28 DIAGNOSIS — C182 Malignant neoplasm of ascending colon: Secondary | ICD-10-CM | POA: Diagnosis not present

## 2022-12-28 DIAGNOSIS — R918 Other nonspecific abnormal finding of lung field: Secondary | ICD-10-CM | POA: Diagnosis not present

## 2022-12-28 LAB — CBC AND DIFFERENTIAL
HCT: 38 (ref 36–46)
Hemoglobin: 12.6 (ref 12.0–16.0)
Neutrophils Absolute: 3.24
Platelets: 149 K/uL — AB (ref 150–400)
WBC: 6

## 2022-12-28 LAB — BASIC METABOLIC PANEL WITH GFR
BUN: 11 (ref 4–21)
CO2: 29 — AB (ref 13–22)
Chloride: 105 (ref 99–108)
Creatinine: 1.1 (ref 0.5–1.1)
Glucose: 93
Potassium: 3.7 meq/L (ref 3.5–5.1)
Sodium: 139 (ref 137–147)

## 2022-12-28 LAB — HEPATIC FUNCTION PANEL
ALT: 16 U/L (ref 7–35)
AST: 31 (ref 13–35)
Alkaline Phosphatase: 84 (ref 25–125)
Bilirubin, Total: 0.8

## 2022-12-28 LAB — COMPREHENSIVE METABOLIC PANEL
Albumin: 3.9 (ref 3.5–5.0)
Calcium: 9.5 (ref 8.7–10.7)

## 2022-12-28 LAB — CBC: RBC: 4.21 (ref 3.87–5.11)

## 2022-12-28 NOTE — Telephone Encounter (Signed)
FYI she has an appointment with you on Friday. Spoke with pt who reports that he swelling has increased. Pt reports a little shortness of breath at times but states not all the time. Pt has taken some extra lasix without much improvement but states that they has decreased this am after being in the bed last pm. Advised to do daily weights, keep log of BP, decrease salt intake, elevate as much as possible and wear compression hose. Pt has been advised to go to the ED if shortness of breath increases. Pt has an appointment scheduled. Pt verbalized understanding and had no additional questions.

## 2022-12-30 ENCOUNTER — Other Ambulatory Visit: Payer: Self-pay | Admitting: Oncology

## 2022-12-30 ENCOUNTER — Inpatient Hospital Stay: Payer: Medicare HMO | Attending: Oncology | Admitting: Oncology

## 2022-12-30 VITALS — BP 125/81 | HR 78 | Temp 97.8°F | Resp 18 | Ht 59.0 in | Wt 166.0 lb

## 2022-12-30 DIAGNOSIS — R978 Other abnormal tumor markers: Secondary | ICD-10-CM | POA: Diagnosis not present

## 2022-12-30 DIAGNOSIS — C801 Malignant (primary) neoplasm, unspecified: Secondary | ICD-10-CM

## 2022-12-30 LAB — CEA: CEA: 5.2

## 2022-12-30 NOTE — Progress Notes (Signed)
Cardiology Office Note:  .   Date:  12/30/2022  ID:  [name], DOB [birth year], MRN [id number] PCP: Jerrye Bushy, FNP  [facility] Providers Cardiologist:  None    History of Present Illness: .   [name] is a 67 y.o. female with a past medical history of CAD left heart cath in 2000 with 40% stenosis and unknown artery, AAA, sinus bradycardia, hypertension, sleep apnea, history of colon cancer in 2012, GERD, BPPV, hyperlipidemia.  03/03/2021 monitor predominant rhythm was sinus, 8 episodes of SVT that were asymptomatic, triggered events were for sinus rhythm. 02/18/2021 Lexiscan no ischemia 07/25/2019 echo EF 60 to 65%, mildly increased LVH, grade 2 DD, mild MR, mild dilatation ascending aorta 38 mm, mild biatrial dilatation 02/11/2017 left heart cath normal coronary arteries   Most recently evaluated by Dr. Bing Matter on 01/20/2022, at that time she was most bothered by swelling of her lower extremities which was suspected to be secondary to her diltiazem, discussion was had surrounding continue current medications, increase diuretic, or discontinue the medication and she elected to watch the situation and let us know if she needed further reevaluation.   She presents today for evaluation of her pedal edema, also has some DOE. She denies chest pain, palpitations, dyspnea, pnd, orthopnea, n, v, dizziness, syncope, edema, weight gain, or early satiety.   ROS: Review of Systems  Respiratory:  Positive for shortness of breath.   Cardiovascular:  Positive for leg swelling.  All other systems reviewed and are negative.    Studies Reviewed: .        Cardiac Studies & Procedures     STRESS TESTS  MYOCARDIAL PERFUSION IMAGING 02/18/2021  Narrative   The study is normal. The study is low risk.   Left ventricular function is normal. Nuclear stress EF: 74 %. The left ventricular ejection fraction is hyperdynamic (>65%).   Prior study not available for  comparison.   ECHOCARDIOGRAM  ECHOCARDIOGRAM COMPLETE 07/25/2019  Narrative ECHOCARDIOGRAM REPORT    Patient Name:   [name] Date of Exam: 07/25/2019 Medical Rec #:  [id number]     Height:       60.0 in Accession #:    [id number]    Weight:       167.0 lb Date of Birth:  05/26/1956     BSA:          1.73 m Patient Age:    64 years      BP:           116/72 mmHg Patient Gender: F             HR:           58 bpm. Exam Location:  [facility]  Procedure: 2D Echo  Indications:    Palpitations [R00.2]  History:        Patient has prior history of Echocardiogram examinations, most recent 06/16/2018. Signs/Symptoms:Chest Pain and Syncope; Risk Factors:Dyslipidemia and Diabetes.  Sonographer:    Louie Boston Referring Phys: [phone] ROBERT J KRASOWSKI  IMPRESSIONS   1. Left ventricular ejection fraction, by estimation, is 60 to 65%. The left ventricle has normal function. The left ventrical has no regional wall motion abnormalities. There is mildly increased left ventricular hypertrophy. Left ventricular diastolic parameters are consistent with Grade II diastolic dysfunction (pseudonormalization). 2. The mitral valve is normal in structure and function. Mild mitral valve regurgitation. No evidence of mitral stenosis. 3. The aortic valve is normal in structure and  function. Aortic valve regurgitation is not visualized. No aortic stenosis is present. 4. There is mild dilatation of the ascending aorta measuring 38 mm. 5. Left atrial size was mild to moderately dilated. No left atrial/left atrial appendage thrombus was detected.  FINDINGS Left Ventricle: Left ventricular ejection fraction, by estimation, is 60 to 65%. The left ventricle has normal function. The left ventricle has no regional wall motion abnormalities. The left ventricular internal cavity size was normal in size. There is mildly increased left ventricular hypertrophy. Left ventricular diastolic parameters are consistent with  Grade II diastolic dysfunction (pseudonormalization).  Right Ventricle: The right ventricular size is normal. No increase in right ventricular wall thickness. Right ventricular systolic function is normal. There is normal pulmonary artery systolic pressure. The tricuspid regurgitant velocity is 2.16 m/s, and with an assumed right atrial pressure of 3 mmHg, the estimated right ventricular systolic pressure is 21.7 mmHg.  Left Atrium: Left atrial size was mild to moderately dilated.  Right Atrium: Right atrial size was normal in size.  Pericardium: There is no evidence of pericardial effusion.  Mitral Valve: The mitral valve is normal in structure and function. Normal mobility of the mitral valve leaflets. Mild mitral valve regurgitation. No evidence of mitral valve stenosis.  Tricuspid Valve: The tricuspid valve is normal in structure. Tricuspid valve regurgitation is mild . No evidence of tricuspid stenosis.  Aortic Valve: The aortic valve is normal in structure and function. Aortic valve regurgitation is not visualized. No aortic stenosis is present.  Pulmonic Valve: The pulmonic valve was normal in structure. Pulmonic valve regurgitation is not visualized. No evidence of pulmonic stenosis.  Aorta: The aortic root is normal in size and structure. There is mild dilatation of the ascending aorta measuring 38 mm.  Venous: The inferior vena cava is normal in size with greater than 50% respiratory variability, suggesting right atrial pressure of 3 mmHg. The inferior vena cava and the hepatic vein show a normal flow pattern.  IAS/Shunts: No atrial level shunt detected by color flow Doppler.   LEFT VENTRICLE PLAX 2D LVIDd:         4.00 cm  Diastology LVIDs:         3.00 cm  LV e' lateral:   8.38 cm/s LV PW:         1.20 cm  LV E/e' lateral: 8.7 LV IVS:        1.20 cm  LV e' medial:    5.77 cm/s LVOT diam:     2.10 cm  LV E/e' medial:  12.6 LV SV:         82.78 ml LV SV Index:    19.19 LVOT Area:     3.46 cm   RIGHT VENTRICLE             IVC RV S prime:     10.00 cm/s  IVC diam: 1.90 cm TAPSE (M-mode): 1.6 cm  LEFT ATRIUM             Index       RIGHT ATRIUM           Index LA diam:        2.80 cm 1.62 cm/m  RA Area:     13.90 cm LA Vol (A2C):   49.4 ml 28.57 ml/m RA Volume:   31.10 ml  17.99 ml/m LA Vol (A4C):   53.1 ml 30.71 ml/m LA Biplane Vol: 51.4 ml 29.73 ml/m AORTIC VALVE LVOT Vmax:   104.00 cm/s LVOT Vmean:  68.200 cm/s LVOT VTI:    0.239 m  AORTA Ao Root diam: 2.90 cm  MITRAL VALVE                        TRICUSPID VALVE MV Area (PHT): 3.60 cm             TR Peak grad:   18.7 mmHg MV Decel Time: 211 msec             TR Vmax:        216.00 cm/s MV E velocity: 72.80 cm/s 103 cm/s MV A velocity: 58.70 cm/s 70.3 cm/s SHUNTS MV E/A ratio:  1.24       1.5       Systemic VTI:  0.24 m Systemic Diam: 2.10 cm  Belva Crome MD Electronically signed by Belva Crome MD Signature Date/Time: 07/25/2019/2:23:05 PM    Final    MONITORS  LONG TERM MONITOR (3-14 DAYS) 03/03/2021  Narrative Patch Wear Time:  7 days and 1 hours (2022-08-30T13:57:55-0400 to 2022-09-06T15:01:34-0400)  Patient had a min HR of 47 bpm, max HR of 179 bpm, and avg HR of 77 bpm. Predominant underlying rhythm was Sinus Rhythm. 8 Supraventricular Tachycardia runs occurred, the run with the fastest interval lasting 7 beats with a max rate of 179 bpm, the longest lasting 8 beats with an avg rate of 108 bpm. Isolated SVEs were rare (<1.0%), SVE Couplets were rare (<1.0%), and SVE Triplets were rare (<1.0%). Isolated VEs were rare (<1.0%), VE Couplets were rare (<1.0%), and no VE Triplets were present.  Summary conclusions: An episode of supraventricular tachycardia with the fastest episode 7 beats at rate of 179, longest episode 8 beats. Multiple triggered event showing sinus rhythm           Risk Assessment/Calculations:             Physical Exam:   VS:  LMP  (LMP  Unknown)    Wt Readings from Last 3 Encounters:  12/30/22 166 lb (75.3 kg)  10/01/22 163 lb 8 oz (74.2 kg)  03/23/22 151 lb (68.5 kg)    GEN: Well nourished, well developed in no acute distress NECK: No JVD; No carotid bruits CARDIAC: RRR, no murmurs, rubs, gallops RESPIRATORY:  Clear to auscultation without rales, wheezing or rhonchi  ABDOMEN: Soft, non-tender, non-distended EXTREMITIES:  No edema appreciated today; No deformity   ASSESSMENT AND PLAN: .   Pedal edema and DOE - pedal edema has been occurring for some time, it was felt by her primary cardiologist to be most likely related to cardizem use. I do not appreciate any pedal edema today, she does have vascular insufficiency though. Will stop her cardizem to see if this helps. Repeat echocardiogram for DOE. Will check proBNP. She is also on gabapentin, which could also cause pedal edema.  CAD - s/p LHC 2000 with 40% stenosis and unknown artery, records are unavailable for review. Stable with no anginal symptoms. No indication for ischemic evaluation.  Continue ASA 81 mg daily, continue metoprolol but will increase to 50 mg daily since we are stopping her cardizem, continue Ranexa 500 mg twice daily, continue Crestor 20 mg daily.  SVT - stopping her cardizem, will increase her metoprolol. Currently quiescent.  HLD - appears to be managed by her PCP, continue Crestor.        Dispo: Repeat echo, BMET, CBC, ProBNP  Signed, Flossie Dibble, NP

## 2022-12-31 ENCOUNTER — Ambulatory Visit: Payer: Medicare HMO | Attending: Cardiology | Admitting: Cardiology

## 2022-12-31 ENCOUNTER — Encounter: Payer: Self-pay | Admitting: Cardiology

## 2022-12-31 ENCOUNTER — Telehealth: Payer: Self-pay | Admitting: Oncology

## 2022-12-31 VITALS — BP 128/80 | HR 86 | Ht 60.0 in | Wt 166.4 lb

## 2022-12-31 DIAGNOSIS — I7121 Aneurysm of the ascending aorta, without rupture: Secondary | ICD-10-CM

## 2022-12-31 DIAGNOSIS — R609 Edema, unspecified: Secondary | ICD-10-CM | POA: Diagnosis not present

## 2022-12-31 DIAGNOSIS — E782 Mixed hyperlipidemia: Secondary | ICD-10-CM

## 2022-12-31 DIAGNOSIS — R0600 Dyspnea, unspecified: Secondary | ICD-10-CM | POA: Diagnosis not present

## 2022-12-31 DIAGNOSIS — I2510 Atherosclerotic heart disease of native coronary artery without angina pectoris: Secondary | ICD-10-CM | POA: Diagnosis not present

## 2022-12-31 MED ORDER — METOPROLOL SUCCINATE ER 50 MG PO TB24: 50.0000 mg | ORAL_TABLET | Freq: Two times a day (BID) | ORAL | 3 refills | Status: DC

## 2022-12-31 NOTE — Telephone Encounter (Signed)
Patient has been scheduled for a 56m follow up and September appts were canceled and patient made aware.  Scheduling Message Entered by Gery Pray H on 12/30/2022 at 10:02 AM Priority: Routine <No visit type provided>  Department: [facility]  Provider:  Scheduling Notes:  Cancel Sept appts.    RT 6 months with labs 2 days before appt

## 2022-12-31 NOTE — Patient Instructions (Signed)
Medication Instructions:  Your physician has recommended you make the following change in your medication:  Stop Cardezem Increase Metoprolol to 50 mg two times daily  *If you need a refill on your cardiac medications before your next appointment, please call your pharmacy*   Lab Work: Your physician recommends that you return for lab work in: Today for CBC, BMP and ProBNP  If you have labs (blood work) drawn today and your tests are completely normal, you will receive your results only by: MyChart Message (if you have MyChart) OR A paper copy in the mail If you have any lab test that is abnormal or we need to change your treatment, we will call you to review the results.   Testing/Procedures: Your physician has requested that you have an echocardiogram. Echocardiography is a painless test that uses sound waves to create images of your heart. It provides your doctor with information about the size and shape of your heart and how well your heart's chambers and valves are working. This procedure takes approximately one hour. There are no restrictions for this procedure. Please do NOT wear cologne, perfume, aftershave, or lotions (deodorant is allowed). Please arrive 15 minutes prior to your appointment time.    Follow-Up: At [facility], you and your health needs are our priority.  As part of our continuing mission to provide you with exceptional heart care, we have created designated Provider Care Teams.  These Care Teams include your primary Cardiologist (physician) and Advanced Practice Providers (APPs -  Physician Assistants and Nurse Practitioners) who all work together to provide you with the care you need, when you need it.  We recommend signing up for the patient portal called "MyChart".  Sign up information is provided on this After Visit Summary.  MyChart is used to connect with patients for Virtual Visits (Telemedicine).  Patients are able to view lab/test results,  encounter notes, upcoming appointments, etc.  Non-urgent messages can be sent to your provider as well.   To learn more about what you can do with MyChart, go to [email or web address].    Your next appointment:   1 month(s)  Provider:   Gypsy Balsam, MD    Other Instructions

## 2023-01-01 LAB — BASIC METABOLIC PANEL WITH GFR
BUN/Creatinine Ratio: 7 — ABNORMAL LOW (ref 12–28)
BUN: 8 mg/dL (ref 8–27)
CO2: 24 mmol/L (ref 20–29)
Calcium: 9.4 mg/dL (ref 8.7–10.3)
Chloride: 106 mmol/L (ref 96–106)
Creatinine, Ser: 1.14 mg/dL — ABNORMAL HIGH (ref 0.57–1.00)
Glucose: 111 mg/dL — ABNORMAL HIGH (ref 70–99)
Potassium: 3.8 mmol/L (ref 3.5–5.2)
Sodium: 143 mmol/L (ref 134–144)
eGFR: 52 mL/min/1.73 — ABNORMAL LOW

## 2023-01-01 LAB — CBC WITH DIFFERENTIAL/PLATELET
Basophils Absolute: 0 x10E3/uL (ref 0.0–0.2)
Basos: 1 %
EOS (ABSOLUTE): 0.2 x10E3/uL (ref 0.0–0.4)
Eos: 3 %
Hematocrit: 38 % (ref 34.0–46.6)
Hemoglobin: 12.6 g/dL (ref 11.1–15.9)
Immature Grans (Abs): 0 x10E3/uL (ref 0.0–0.1)
Immature Granulocytes: 0 %
Lymphocytes Absolute: 1.7 x10E3/uL (ref 0.7–3.1)
Lymphs: 30 %
MCH: 30.1 pg (ref 26.6–33.0)
MCHC: 33.2 g/dL (ref 31.5–35.7)
MCV: 91 fL (ref 79–97)
Monocytes Absolute: 0.6 x10E3/uL (ref 0.1–0.9)
Monocytes: 11 %
Neutrophils Absolute: 3 x10E3/uL (ref 1.4–7.0)
Neutrophils: 55 %
Platelets: 152 x10E3/uL (ref 150–450)
RBC: 4.19 x10E6/uL (ref 3.77–5.28)
RDW: 13.4 % (ref 11.7–15.4)
WBC: 5.6 x10E3/uL (ref 3.4–10.8)

## 2023-01-01 LAB — PRO B NATRIURETIC PEPTIDE: NT-Pro BNP: 84 pg/mL (ref 0–301)

## 2023-01-03 ENCOUNTER — Telehealth: Payer: Self-pay | Admitting: *Deleted

## 2023-01-03 NOTE — Telephone Encounter (Signed)
Left message for pt to call back for lab results.

## 2023-01-03 NOTE — Telephone Encounter (Signed)
-----   Message from Flossie Dibble sent at 01/03/2023  7:44 AM EDT ----- Labs look normal. No indication that her heart is causing her to hold onto to too much fluid. Normal results.

## 2023-01-04 NOTE — Telephone Encounter (Signed)
Left message for patient to call back  

## 2023-01-04 NOTE — Telephone Encounter (Signed)
-----   Message from Flossie Dibble sent at 01/03/2023  7:44 AM EDT ----- Labs look normal. No indication that her heart is causing her to hold onto to too much fluid. Normal results.

## 2023-01-05 ENCOUNTER — Other Ambulatory Visit: Payer: Self-pay | Admitting: Cardiology

## 2023-01-05 DIAGNOSIS — H538 Other visual disturbances: Secondary | ICD-10-CM | POA: Diagnosis not present

## 2023-01-11 ENCOUNTER — Encounter: Payer: Self-pay | Admitting: Oncology

## 2023-01-12 ENCOUNTER — Encounter: Payer: Self-pay | Admitting: Oncology

## 2023-01-22 ENCOUNTER — Other Ambulatory Visit: Payer: Self-pay | Admitting: Cardiology

## 2023-01-31 ENCOUNTER — Ambulatory Visit: Payer: Medicare HMO

## 2023-01-31 DIAGNOSIS — E782 Mixed hyperlipidemia: Secondary | ICD-10-CM

## 2023-01-31 DIAGNOSIS — I7121 Aneurysm of the ascending aorta, without rupture: Secondary | ICD-10-CM | POA: Diagnosis not present

## 2023-01-31 DIAGNOSIS — I2510 Atherosclerotic heart disease of native coronary artery without angina pectoris: Secondary | ICD-10-CM | POA: Diagnosis not present

## 2023-01-31 DIAGNOSIS — R609 Edema, unspecified: Secondary | ICD-10-CM | POA: Diagnosis not present

## 2023-01-31 DIAGNOSIS — R0600 Dyspnea, unspecified: Secondary | ICD-10-CM | POA: Diagnosis not present

## 2023-01-31 LAB — ECHOCARDIOGRAM COMPLETE: S' Lateral: 2.9 cm

## 2023-02-01 ENCOUNTER — Telehealth: Payer: Self-pay

## 2023-02-01 NOTE — Telephone Encounter (Signed)
-----   Message from Flossie Dibble sent at 02/01/2023  7:40 AM EDT ----- Echo showed heart is squeezing well and a little stiff when it relaxes. Mild leaking around her mitral valve. Mild dilation of her ascending aorta-- this is all essentially unchanged from exam in 2021 and actually slightly better. Nothing to cause her pedal edema--I did not note any edema the day she came in so it may just be happening occasionally.   Elevate feet when able to Wear compression socks during the day--take off when going to sleep Restrict salt intake to < 2 g/day

## 2023-02-02 ENCOUNTER — Telehealth: Payer: Self-pay | Admitting: *Deleted

## 2023-02-02 NOTE — Telephone Encounter (Signed)
-----   Message from Flossie Dibble sent at 02/01/2023  7:40 AM EDT ----- Echo showed heart is squeezing well and a little stiff when it relaxes. Mild leaking around her mitral valve. Mild dilation of her ascending aorta-- this is all essentially unchanged from exam in 2021 and actually slightly better. Nothing to cause her pedal edema--I did not note any edema the day she came in so it may just be happening occasionally.   Elevate feet when able to Wear compression socks during the day--take off when going to sleep Restrict salt intake to < 2 g/day

## 2023-02-02 NOTE — Telephone Encounter (Signed)
Left message for pt to call back  °

## 2023-02-03 NOTE — Telephone Encounter (Signed)
Pt returning CMA's call regarding results. Please advise

## 2023-02-03 NOTE — Telephone Encounter (Signed)
Attempted to call patient. Persistent busy tone on phone.

## 2023-02-04 ENCOUNTER — Encounter: Payer: Self-pay | Admitting: *Deleted

## 2023-02-04 NOTE — Telephone Encounter (Signed)
Letter of results sent to pt  

## 2023-02-11 ENCOUNTER — Encounter: Payer: Self-pay | Admitting: Cardiology

## 2023-02-11 ENCOUNTER — Ambulatory Visit: Payer: Medicare HMO | Attending: Cardiology | Admitting: Cardiology

## 2023-02-11 VITALS — BP 110/62 | HR 53 | Ht 60.0 in | Wt 170.2 lb

## 2023-02-11 DIAGNOSIS — I10 Essential (primary) hypertension: Secondary | ICD-10-CM

## 2023-02-11 DIAGNOSIS — R0789 Other chest pain: Secondary | ICD-10-CM | POA: Diagnosis not present

## 2023-02-11 DIAGNOSIS — R5383 Other fatigue: Secondary | ICD-10-CM

## 2023-02-11 DIAGNOSIS — I2510 Atherosclerotic heart disease of native coronary artery without angina pectoris: Secondary | ICD-10-CM

## 2023-02-11 NOTE — Addendum Note (Signed)
Addended by: Baldo Ash D on: 02/11/2023 02:14 PM   Modules accepted: Orders

## 2023-02-11 NOTE — Patient Instructions (Signed)
Medication Instructions:  Your physician recommends that you continue on your current medications as directed. Please refer to the Current Medication list given to you today.  *If you need a refill on your cardiac medications before your next appointment, please call your pharmacy*   Lab Work: BMP, TSH- today If you have labs (blood work) drawn today and your tests are completely normal, you will receive your results only by: MyChart Message (if you have MyChart) OR A paper copy in the mail If you have any lab test that is abnormal or we need to change your treatment, we will call you to review the results.   Testing/Procedures: None Ordered   Follow-Up: At [facility], you and your health needs are our priority.  As part of our continuing mission to provide you with exceptional heart care, we have created designated Provider Care Teams.  These Care Teams include your primary Cardiologist (physician) and Advanced Practice Providers (APPs -  Physician Assistants and Nurse Practitioners) who all work together to provide you with the care you need, when you need it.  We recommend signing up for the patient portal called "MyChart".  Sign up information is provided on this After Visit Summary.  MyChart is used to connect with patients for Virtual Visits (Telemedicine).  Patients are able to view lab/test results, encounter notes, upcoming appointments, etc.  Non-urgent messages can be sent to your provider as well.   To learn more about what you can do with MyChart, go to [email or web address].    Your next appointment:   3 month(s)  The format for your next appointment:   In Person  Provider:   Gypsy Balsam, MD    Other Instructions NA

## 2023-02-11 NOTE — Progress Notes (Signed)
Cardiology Office Note:    Date:  02/11/2023   ID:  [name], DOB 10/05/1955, MRN [id number]  PCP:  Hadley Pen, MD  Cardiologist:  Gypsy Balsam, MD    Referring MD: Jerrye Bushy, FNP   Chief Complaint  Patient presents with   Results   Foot Swelling    History of Present Illness:    [name] is a 67 y.o. female   with past medical history significant for chronic back problem that eventually required surgery.  She still suffering from that.  She did have a cardiac catheterization 2000 with some 40% blockages then cardiac catheterization repeated 2018 showing no significant abnormalities.  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  Patient was for follow-up Comes today to my office for follow-up overall she is doing fair.  She complained of some atypical chest pain lasting only for split-second not related to exercise in terms of swelling of lower extremities still present she said right now not bad but 1 day progresses the lower legs will be relieved swollen.  Denies having much palpitations  Past Medical History:  Diagnosis Date   Adjustment disorder    Anesthesia complication 01/09/2018   Overview:  "It takes me a while to wake up."  Formatting of this note might be different from the original. "It takes me a while to wake up."   Anxiety 01/09/2018   Anxiety disorder    Arthritis    hands, back   Ascending aortic aneurysm (HCC) 38 mm based on echocardiogram from 2021 05/29/2020   Atypical chest pain 12/19/2016   Borderline diabetes 01/09/2018   CAD (coronary artery disease)    Cancer (HCC) 01/09/2018   Overview:  colon  Formatting of this note might be different from the original. colon   Cataract    removed by surgery   Chest pain    Colon cancer (HCC) 2012   Depression    Depression 01/09/2018   Dysphonia    Dysthymic disorder     Fatty liver 01/09/2018   Fusion of lumbar spine 08/21/2018   Gastroesophageal reflux disease    Hiatal hernia with GERD 01/09/2018   History of kidney stones 01/09/2018   Hyperlipidemia    Insomnia    Kidney stones    removed by surgery   MRSA (methicillin resistant Staphylococcus aureus)    MRSA infection 01/09/2018   Overview:  states winter of 2016, treated at [facility] by [place]ble [place] NP  Formatting of this note might be different from the original. 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 Mild pancolonic diverticulosis/ History of colon cancer status post right hemicolectomy. No evidence of recurrence    ESOPHAGOGASTRODUODENOSCOPY  05/26/2017   Schatzkis ring statust post esophageal dilatation.  Hiatal hernia.    EYE SURGERY Left 01/26/2022   to remove floaters   PORT-A-CATH REMOVAL     removal kidney stones     TUBAL LIGATION     UPPER GASTROINTESTINAL ENDOSCOPY      Current Medications: Current Meds  Medication Sig   aspirin EC 81 MG tablet Take 81 mg by mouth daily.   B Complex-Folic Acid (B COMPLEX-VITAMIN B12 PO) Take 1 tablet by mouth daily. Unknown strength   CALCIUM PO Take 2 tablets by mouth daily. Unknown strength   Cholecalciferol (VITAMIN D3) 2000 units TABS Take 2,000 Units by mouth daily.   CINNAMON PO Take 1 tablet by mouth daily. Unknown strength   dexlansoprazole (DEXILANT) 60 MG capsule Take 1 capsule  (60 mg total) by mouth daily.   DULoxetine (CYMBALTA) 60 MG capsule Take 60 mg by mouth daily.   famotidine (PEPCID) 20 MG tablet Take 1 tablet (20 mg total) by mouth at bedtime.   furosemide (LASIX) 20 MG tablet Take 1 tablet (20 mg total) by mouth daily.   gabapentin (NEURONTIN) 800 MG tablet Take 800 mg by mouth 3 (three) times daily.   meloxicam (MOBIC) 15 MG tablet Take 15 mg by mouth daily.   metoprolol succinate (TOPROL XL) 50 MG 24 hr tablet Take 1 tablet (50 mg total) by mouth 2 (two) times daily. Take with or immediately following a meal.   Multiple Vitamins-Minerals (CENTRUM SILVER 50+WOMEN) TABS Take 1 tablet by mouth daily. Unknown strength   nitroGLYCERIN (NITROSTAT) 0.4 MG SL tablet Place 1 tablet (0.4 mg total) under the tongue every 5 (five) minutes x 3 doses as needed for chest pain.   ofloxacin (OCUFLOX) 0.3 % ophthalmic solution Place 1 drop into the left eye 4 (four) times daily.   prednisoLONE acetate (PRED FORTE) 1 % ophthalmic suspension Place 1 drop into the left eye in the morning, at noon, in the evening, and at bedtime.   RABEprazole (ACIPHEX) 20 MG tablet Take 20 mg by mouth daily.   ranolazine (RANEXA) 500 MG 12 hr tablet Take 1 tablet by mouth twice daily (Patient taking differently: Take 500 mg by mouth 2 (two) times daily.)   rosuvastatin (CRESTOR) 20 MG tablet Take 20 mg by mouth at bedtime.   Current Facility-Administered Medications for the 02/11/23 encounter (Office Visit) with Georgeanna Lea, MD  Medication   0.9 %  sodium chloride infusion   0.9 %  sodium chloride infusion     Allergies:   Penicillins, Codeine, Aciphex [rabeprazole], Guaifenesin & derivatives, Latex, Other, Pseudoephedrine, Sulfa antibiotics, Sulfasalazine, and Tape   Social History   Socioeconomic History   Marital status: Widowed    Spouse name: Not on file   Number of children: Not on file   Years of education: Not on file   Highest education level: Not on file   Occupational History   Not on file  Tobacco Use   Smoking status: Never   Smokeless tobacco: Never  Vaping Use   Vaping status: Never Used  Substance and Sexual Activity   Alcohol use: No   Drug use: No   Sexual activity: Not Currently    Birth control/protection: Post-menopausal  Other Topics Concern   Not on file  Social History Narrative   Not on file   Social Determinants of Health   Financial Resource Strain: Not on file  Food Insecurity: Not on file  Transportation Needs: Not on file  Physical Activity: Not on file  Stress: Not on  file  Social Connections: Not on file     Family History: The patient's family history includes CAD in her mother; COPD in her father; CVA in her brother; Heart failure in her father; Hypertension in her mother; Kidney disease in her mother. There is no history of Colon cancer, Rectal cancer, Stomach cancer, or Esophageal cancer. ROS:   Please see the history of present illness.    All 14 point review of systems negative except as described per history of present illness  EKGs/Labs/Other Studies Reviewed:         Recent Labs: 12/28/2022: ALT 16 12/31/2022: BUN 8; Creatinine, Ser 1.14; Hemoglobin 12.6; NT-Pro BNP 84; Platelets 152; Potassium 3.8; Sodium 143  Recent Lipid Panel No results found for: "CHOL", "TRIG", "HDL", "CHOLHDL", "VLDL", "LDLCALC", "LDLDIRECT"  Physical Exam:    VS:  BP 110/62 (BP Location: Left Arm, Patient Position: Sitting)   Pulse (!) 53   Ht 5' (1.524 m)   Wt 170 lb 3.2 oz (77.2 kg)   LMP  (LMP Unknown)   SpO2 95%   BMI 33.24 kg/m     Wt Readings from Last 3 Encounters:  02/11/23 170 lb 3.2 oz (77.2 kg)  12/31/22 166 lb 6.4 oz (75.5 kg)  12/30/22 166 lb (75.3 kg)     GEN:  Well nourished, well developed in no acute distress HEENT: Normal NECK: No JVD; No carotid bruits LYMPHATICS: No lymphadenopathy CARDIAC: RRR, no murmurs, no rubs, no gallops RESPIRATORY:  Clear to auscultation without rales,  wheezing or rhonchi  ABDOMEN: Soft, non-tender, non-distended MUSCULOSKELETAL:  No edema; No deformity  SKIN: Warm and dry LOWER EXTREMITIES: no swelling NEUROLOGIC:  Alert and oriented x 3 PSYCHIATRIC:  Normal affect   ASSESSMENT:    1. Coronary artery disease involving native coronary artery of native heart without angina pectoris   2. Primary hypertension   3. Atypical chest pain    PLAN:    In order of problems listed above:  Coronary disease stable from that point review of chest pain she described very atypical and not related to his heart. Essential hypertension blood pressure well-controlled continue present management. Swelling of lower extremities, pro BNP normal, echocardiogram showed preserved ejection fraction, no cardiac explanation for his symptoms.  I will check Chem-7 today and then if everything is fine I may increase likely dose of her Lasix to 40 in the morning 20 in the afternoon depends of the kidney function is.  We did talk about elevating a feet and potentially wearing elastic stockings.   Medication Adjustments/Labs and Tests Ordered: Current medicines are reviewed at length with the patient today.  Concerns regarding medicines are outlined above.  No orders of the defined types were placed in this encounter.  Medication changes: No orders of the defined types were placed in this encounter.   Signed, Georgeanna Lea, MD, [facility] 02/11/2023 2:06 PM    [facility]

## 2023-02-12 LAB — BASIC METABOLIC PANEL
BUN/Creatinine Ratio: 8 — ABNORMAL LOW (ref 12–28)
BUN: 10 mg/dL (ref 8–27)
CO2: 21 mmol/L (ref 20–29)
Calcium: 10 mg/dL (ref 8.7–10.3)
Chloride: 103 mmol/L (ref 96–106)
Creatinine, Ser: 1.19 mg/dL — ABNORMAL HIGH (ref 0.57–1.00)
Glucose: 79 mg/dL (ref 70–99)
Potassium: 4.3 mmol/L (ref 3.5–5.2)
Sodium: 140 mmol/L (ref 134–144)
eGFR: 49 mL/min/{1.73_m2} — ABNORMAL LOW (ref >59)

## 2023-02-12 LAB — TSH: TSH: 4.3 u[IU]/mL (ref 0.450–4.500)

## 2023-02-16 DIAGNOSIS — Z13220 Encounter for screening for lipoid disorders: Secondary | ICD-10-CM | POA: Diagnosis not present

## 2023-02-16 DIAGNOSIS — I10 Essential (primary) hypertension: Secondary | ICD-10-CM | POA: Diagnosis not present

## 2023-02-16 DIAGNOSIS — L659 Nonscarring hair loss, unspecified: Secondary | ICD-10-CM | POA: Diagnosis not present

## 2023-02-16 DIAGNOSIS — Z0000 Encounter for general adult medical examination without abnormal findings: Secondary | ICD-10-CM | POA: Diagnosis not present

## 2023-02-16 DIAGNOSIS — F5104 Psychophysiologic insomnia: Secondary | ICD-10-CM | POA: Diagnosis not present

## 2023-02-16 DIAGNOSIS — F331 Major depressive disorder, recurrent, moderate: Secondary | ICD-10-CM | POA: Diagnosis not present

## 2023-02-16 DIAGNOSIS — G629 Polyneuropathy, unspecified: Secondary | ICD-10-CM | POA: Diagnosis not present

## 2023-02-16 DIAGNOSIS — R7303 Prediabetes: Secondary | ICD-10-CM | POA: Diagnosis not present

## 2023-02-17 ENCOUNTER — Telehealth: Payer: Self-pay

## 2023-02-17 ENCOUNTER — Telehealth: Payer: Self-pay | Admitting: Cardiology

## 2023-02-17 DIAGNOSIS — I10 Essential (primary) hypertension: Secondary | ICD-10-CM

## 2023-02-17 NOTE — Telephone Encounter (Signed)
 Pt is requesting a callback regarding her results. Please advise

## 2023-02-17 NOTE — Telephone Encounter (Signed)
Spoke with pt regarding lab results. Pt agreed to increase Lasix to 40mg  in the AM and 20mg  in the PM. She will follow up with BMP in  1 week.

## 2023-02-22 ENCOUNTER — Other Ambulatory Visit: Payer: Self-pay | Admitting: Cardiology

## 2023-02-24 DIAGNOSIS — I10 Essential (primary) hypertension: Secondary | ICD-10-CM | POA: Diagnosis not present

## 2023-02-25 LAB — BASIC METABOLIC PANEL
BUN/Creatinine Ratio: 10 — ABNORMAL LOW (ref 12–28)
BUN: 14 mg/dL (ref 8–27)
CO2: 27 mmol/L (ref 20–29)
Calcium: 9.7 mg/dL (ref 8.7–10.3)
Chloride: 96 mmol/L (ref 96–106)
Creatinine, Ser: 1.36 mg/dL — ABNORMAL HIGH (ref 0.57–1.00)
Glucose: 98 mg/dL (ref 70–99)
Potassium: 3.9 mmol/L (ref 3.5–5.2)
Sodium: 137 mmol/L (ref 134–144)
eGFR: 42 mL/min/{1.73_m2} — ABNORMAL LOW (ref >59)

## 2023-02-27 ENCOUNTER — Other Ambulatory Visit: Payer: Self-pay | Admitting: Cardiology

## 2023-03-04 ENCOUNTER — Other Ambulatory Visit: Payer: Self-pay | Admitting: Cardiology

## 2023-03-07 NOTE — Telephone Encounter (Signed)
Results reviewed with pt as per Dr. Vanetta Shawl note.  Pt verbalized understanding and had no additional questions. Routed to PCP

## 2023-03-07 NOTE — Telephone Encounter (Signed)
Pt calling back to go over lab results

## 2023-03-09 ENCOUNTER — Ambulatory Visit: Payer: Medicare HMO | Admitting: Oncology

## 2023-03-09 ENCOUNTER — Other Ambulatory Visit: Payer: Medicare HMO

## 2023-03-25 DIAGNOSIS — Z1231 Encounter for screening mammogram for malignant neoplasm of breast: Secondary | ICD-10-CM | POA: Diagnosis not present

## 2023-03-30 DIAGNOSIS — H538 Other visual disturbances: Secondary | ICD-10-CM | POA: Diagnosis not present

## 2023-04-17 ENCOUNTER — Other Ambulatory Visit: Payer: Self-pay | Admitting: Cardiology

## 2023-04-17 ENCOUNTER — Other Ambulatory Visit: Payer: Self-pay | Admitting: Gastroenterology

## 2023-04-22 ENCOUNTER — Other Ambulatory Visit: Payer: Self-pay | Admitting: Gastroenterology

## 2023-05-03 ENCOUNTER — Telehealth: Payer: Self-pay | Admitting: Cardiology

## 2023-05-03 ENCOUNTER — Other Ambulatory Visit: Payer: Self-pay

## 2023-05-03 MED ORDER — RANOLAZINE ER 500 MG PO TB12: 500.0000 mg | ORAL_TABLET | Freq: Two times a day (BID) | ORAL | 3 refills | Status: DC

## 2023-05-03 NOTE — Telephone Encounter (Signed)
*  STAT* If patient is at the pharmacy, call can be transferred to refill team.   1. Which medications need to be refilled? (please list name of each medication and dose if known) ranolazine (RANEXA) 500 MG 12 hr tablet    2. Would you like to learn more about the convenience, safety, & potential cost savings by using the [facility]? No     3. Are you open to using the [facility] (Type [facility]. No ).   4. Which pharmacy/location (including street and city if local pharmacy) is medication to be sent to? [facility] - , [place] - [address]    5. Do they need a 30 day or 90 day supply? 90

## 2023-05-17 ENCOUNTER — Ambulatory Visit: Payer: Medicare HMO | Attending: Cardiology | Admitting: Cardiology

## 2023-05-17 ENCOUNTER — Telehealth (HOSPITAL_COMMUNITY): Payer: Self-pay | Admitting: *Deleted

## 2023-05-17 ENCOUNTER — Encounter: Payer: Self-pay | Admitting: Cardiology

## 2023-05-17 VITALS — BP 146/70 | HR 64 | Ht 60.0 in | Wt 168.0 lb

## 2023-05-17 DIAGNOSIS — E782 Mixed hyperlipidemia: Secondary | ICD-10-CM | POA: Diagnosis not present

## 2023-05-17 DIAGNOSIS — R0789 Other chest pain: Secondary | ICD-10-CM | POA: Diagnosis not present

## 2023-05-17 DIAGNOSIS — I10 Essential (primary) hypertension: Secondary | ICD-10-CM

## 2023-05-17 DIAGNOSIS — I2510 Atherosclerotic heart disease of native coronary artery without angina pectoris: Secondary | ICD-10-CM | POA: Diagnosis not present

## 2023-05-17 DIAGNOSIS — R0609 Other forms of dyspnea: Secondary | ICD-10-CM | POA: Diagnosis not present

## 2023-05-17 MED ORDER — ROSUVASTATIN CALCIUM 40 MG PO TABS: 40.0000 mg | ORAL_TABLET | Freq: Every day | ORAL | 3 refills | Status: DC

## 2023-05-17 NOTE — Patient Instructions (Addendum)
Medication Instructions:   INCREASE: Crestor to 40mg  daily- You may double your current dose and your next refill will reflect your new dose.   Lab Work: Today- BMP, ProBNP Your physician recommends that you return for lab work in: 6 weeks You need to have labs done when you are fasting.  You can come Monday through Friday 8:30 am to 12:00 pm and 1:15 to 4:30. You do not need to make an appointment as the order has already been placed. The labs you are going to have done are AST, ALT Lipids.    Testing/Procedures: Your physician has requested that you have a lexiscan myoview. For further information please visit [email or web address] Please follow instruction sheet, as given.  The test will take approximately 3 to 4 hours to complete; you may bring reading material.  If someone comes with you to your appointment, they will need to remain in the main lobby due to limited space in the testing area.   How to prepare for your Myocardial Perfusion Test: Do not eat or drink 3 hours prior to your test, except you may have water. Do not consume products containing caffeine (regular or decaffeinated) 12 hours prior to your test. (ex: coffee, chocolate, sodas, tea). Do bring a list of your current medications with you.  If not listed below, you may take your medications as normal. Do wear comfortable clothes (no dresses or overalls) and walking shoes, tennis shoes preferred (No heels or open toe shoes are allowed). Do NOT wear cologne, perfume, aftershave, or lotions (deodorant is allowed). If these instructions are not followed, your test will have to be rescheduled.     Follow-Up: At [facility], you and your health needs are our priority.  As part of our continuing mission to provide you with exceptional heart care, we have created designated Provider Care Teams.  These Care Teams include your primary Cardiologist (physician) and Advanced Practice Providers (APPs -  Physician Assistants and  Nurse Practitioners) who all work together to provide you with the care you need, when you need it.  We recommend signing up for the patient portal called "MyChart".  Sign up information is provided on this After Visit Summary.  MyChart is used to connect with patients for Virtual Visits (Telemedicine).  Patients are able to view lab/test results, encounter notes, upcoming appointments, etc.  Non-urgent messages can be sent to your provider as well.   To learn more about what you can do with MyChart, go to [email or web address].    Your next appointment:   6 month(s)  The format for your next appointment:   In Person  Provider:   Gypsy Balsam, MD    Other Instructions NA

## 2023-05-17 NOTE — Addendum Note (Signed)
Addended by: Baldo Ash D on: 05/17/2023 09:03 AM   Modules accepted: Orders

## 2023-05-17 NOTE — Addendum Note (Signed)
Addended by: Baldo Ash D on: 05/17/2023 08:58 AM   Modules accepted: Orders

## 2023-05-17 NOTE — Addendum Note (Signed)
Addended by: Baldo Ash D on: 05/17/2023 08:44 AM   Modules accepted: Orders

## 2023-05-17 NOTE — Progress Notes (Signed)
Cardiology Office Note:    Date:  05/17/2023   ID:  [name], DOB May 08, 1956, MRN [id number]  PCP:  Hadley Pen, MD  Cardiologist:  Gypsy Balsam, MD    Referring MD: Hadley Pen, MD   Chief Complaint  Patient presents with   Follow-up    Chest pain    History of Present Illness:    [name] is a 67 y.o. female past medical history significant for chronic back problem that eventually required surgical intervention, cardiac catheterization in 2000 show blockages up to 40% then cardiac catheterization repeat in 2018 showed no hemodynamically significant obstructions.  Also history of essential hypertension, dyslipidemia, mild enlargement of the ascending aorta measuring 38 mm.  Comes today to months for follow-up.  Overall she states majority of time she is doing fine but she described to have episode of chest pain does happen typically she is laying down especially she lays down on the left side sometimes when she walks she will get it.  Taking the breath coughing does not make any difference there is no swelling there is no shortness of breath associated with this sensation.  She take nitroglycerin which helped with the pain.  Also described to have some shortness of breath and fatigue  Past Medical History:  Diagnosis Date   Adjustment disorder    Anesthesia complication 01/09/2018   Overview:  "It takes me a while to wake up."  Formatting of this note might be different from the original. "It takes me a while to wake up."   Anxiety 01/09/2018   Anxiety disorder    Arthritis    hands, back   Ascending aortic aneurysm (HCC) 38 mm based on echocardiogram from 2021 05/29/2020   Atypical chest pain 12/19/2016   Borderline diabetes 01/09/2018   CAD (coronary artery disease)    Cancer (HCC) 01/09/2018   Overview:  colon  Formatting of this note might be different from the original. colon   Cataract    removed by surgery   Chest pain     Colon cancer (HCC) 2012   Depression    Depression 01/09/2018   Dysphonia    Dysthymic disorder    Fatty liver 01/09/2018   Fusion of lumbar spine 08/21/2018   Gastroesophageal reflux disease    Hiatal hernia with GERD 01/09/2018   History of kidney stones 01/09/2018   Hyperlipidemia    Insomnia    Kidney stones    removed by surgery   MRSA (methicillin resistant Staphylococcus aureus)    MRSA infection 01/09/2018   Overview:  states winter of 2016, treated at [facility] by Syble [place] NP  Formatting of this note might be different from the original. 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 Mild pancolonic diverticulosis/ History of colon cancer status post right  hemicolectomy. No evidence of recurrence    ESOPHAGOGASTRODUODENOSCOPY  05/26/2017   Schatzkis ring statust post esophageal dilatation. Hiatal hernia.    EYE SURGERY Left 01/26/2022   to remove floaters   PORT-A-CATH REMOVAL     removal kidney stones     TUBAL LIGATION     UPPER GASTROINTESTINAL ENDOSCOPY      Current Medications: Current Meds  Medication Sig   aspirin EC 81 MG tablet Take 81 mg by mouth daily.   B Complex-Folic Acid (B COMPLEX-VITAMIN B12 PO) Take 1 tablet by mouth daily. Unknown strength   CALCIUM PO Take 2 tablets by mouth daily. Unknown strength   Cholecalciferol (VITAMIN D3) 2000 units TABS Take 2,000 Units by mouth daily.   CINNAMON PO  Take 1 tablet by mouth daily. Unknown strength   dexlansoprazole (DEXILANT) 60 MG capsule TAKE 1  BY MOUTH ONCE DAILY (Patient taking differently: Take 60 mg by mouth daily.)   DULoxetine (CYMBALTA) 60 MG capsule Take 60 mg by mouth daily.   famotidine (PEPCID) 20 MG tablet TAKE 1 TABLET BY MOUTH AT BEDTIME   furosemide (LASIX) 20 MG tablet Take 1 tablet (20 mg total) by mouth daily.   gabapentin (NEURONTIN) 800 MG tablet Take 800 mg by mouth 3 (three) times daily.   meloxicam (MOBIC) 15 MG tablet Take 15 mg by mouth daily.   metoprolol succinate (TOPROL XL) 50 MG 24 hr tablet Take 1 tablet (50 mg total) by mouth 2 (two) times daily. Take with or immediately following a meal.   Multiple Vitamins-Minerals (CENTRUM SILVER 50+WOMEN) TABS Take 1 tablet by mouth daily. Unknown strength   nitroGLYCERIN (NITROSTAT) 0.4 MG SL tablet Place 1 tablet (0.4 mg total) under the tongue every 5 (five) minutes x 3 doses as needed for chest pain.   ofloxacin (OCUFLOX) 0.3 % ophthalmic solution Place 1 drop into the left eye 4 (four) times daily.   prednisoLONE acetate (PRED FORTE) 1 % ophthalmic suspension Place 1 drop into the left eye in the morning, at noon, in the evening, and at bedtime.   RABEprazole (ACIPHEX) 20 MG tablet Take 20 mg by mouth daily.   ranolazine (RANEXA) 500 MG 12 hr tablet Take 1 tablet (500 mg total) by mouth 2 (two) times daily.   rosuvastatin (CRESTOR) 20 MG tablet Take 20 mg by mouth at bedtime.   Current Facility-Administered Medications for the 05/17/23 encounter (Office Visit) with Georgeanna Lea, MD  Medication   0.9 %  sodium chloride infusion   0.9 %  sodium chloride infusion     Allergies:   Penicillins, Codeine, Aciphex [rabeprazole], Guaifenesin & derivatives, Latex, Other, Pseudoephedrine, Sulfa antibiotics, Sulfasalazine, and Tape   Social History   Socioeconomic History   Marital status: Widowed    Spouse name: Not on file   Number of children: Not on file    Years of education: Not on file   Highest education level: Not on file  Occupational History   Not on file  Tobacco Use   Smoking status: Never   Smokeless tobacco: Never  Vaping Use   Vaping status: Never Used  Substance and Sexual Activity   Alcohol use: No   Drug use: No   Sexual activity: Not Currently    Birth control/protection: Post-menopausal  Other Topics Concern   Not on file  Social History Narrative   Not on file   Social Determinants of Health   Financial Resource Strain: Not on file  Food Insecurity: Not on file  Transportation Needs: Not on file  Physical Activity: Not on file  Stress: Not on file  Social Connections: Not on file     Family History: The patient's family history includes CAD in her mother; COPD in her father; CVA in her brother; Heart failure in her father; Hypertension in her mother; Kidney disease in her mother. There is no history of Colon cancer, Rectal cancer, Stomach cancer, or Esophageal cancer. ROS:   Please see the history of present illness.    All 14 point review of systems negative except as described per history of present illness  EKGs/Labs/Other Studies Reviewed:         Recent Labs: 12/28/2022: ALT 16 12/31/2022: Hemoglobin 12.6; NT-Pro BNP 84; Platelets 152 02/11/2023: TSH 4.300 02/24/2023: BUN 14; Creatinine, Ser 1.36; Potassium 3.9; Sodium 137  Recent Lipid Panel No results found for: "CHOL", "TRIG", "HDL", "CHOLHDL", "VLDL", "LDLCALC", "LDLDIRECT"  Physical Exam:    VS:  BP (!) 146/70 (BP Location: Right Arm, Patient Position: Sitting)   Pulse 64   Ht 5' (1.524 m)   Wt 168 lb (76.2 kg)   LMP  (LMP Unknown)   SpO2 96%   BMI 32.81 kg/m     Wt Readings from Last 3 Encounters:  05/17/23 168 lb (76.2 kg)  02/11/23 170 lb 3.2 oz (77.2 kg)  12/31/22 166 lb 6.4 oz (75.5 kg)     GEN:  Well nourished, well developed in no acute distress HEENT: Normal NECK: No JVD; No carotid bruits LYMPHATICS: No  lymphadenopathy CARDIAC: RRR, no murmurs, no rubs, no gallops RESPIRATORY:  Clear to auscultation without rales, wheezing or rhonchi  ABDOMEN: Soft, non-tender, non-distended MUSCULOSKELETAL:  No edema; No deformity  SKIN: Warm and dry LOWER EXTREMITIES: no swelling NEUROLOGIC:  Alert and oriented x 3 PSYCHIATRIC:  Normal affect   ASSESSMENT:    1. Coronary artery disease involving native coronary artery of native heart without angina pectoris   2. Primary hypertension   3. Atypical chest pain   4. Mixed hyperlipidemia    PLAN:    In order of problems listed above:  Chest pain is somewhat atypical but does have nonobstructive coronary disease before.  Will schedule her to have a stress test.  In the meantime continue antiplatelets therapy and continue statin. Essential hypertension blood pressure elevated today but she was driving long time to get to her office because of snow.  She said she checked it at home it is always good we will continue monitoring. Mixed dyslipidemia, I did review blood work done by primary care physician LDL 94 HDL 66 with triglycerides being hide this is from September of this year.  I will ask her to increase dose of Crestor from 20 to 40 mg daily.   Medication Adjustments/Labs and Tests Ordered: Current medicines are reviewed at length with the patient today.  Concerns regarding medicines are outlined above.  Orders Placed This Encounter  Procedures   EKG 12-Lead   Medication changes: No orders of the defined types were placed in this encounter.   Signed, Georgeanna Lea, MD, [facility] 05/17/2023 8:30 AM    [facility]

## 2023-05-17 NOTE — Telephone Encounter (Signed)
Patient given detailed instructions per Myocardial Perfusion Study Information Sheet for the test on 05/19/23 Patient notified to arrive 15 minutes early and that it is imperative to arrive on time for appointment to keep from having the test rescheduled.  If you need to cancel or reschedule your appointment, please call the office within 24 hours of your appointment. . Patient verbalized understanding.Ricky Ala

## 2023-05-18 LAB — BASIC METABOLIC PANEL
BUN/Creatinine Ratio: 9 — ABNORMAL LOW (ref 12–28)
BUN: 8 mg/dL (ref 8–27)
CO2: 25 mmol/L (ref 20–29)
Calcium: 9.5 mg/dL (ref 8.7–10.3)
Chloride: 107 mmol/L — ABNORMAL HIGH (ref 96–106)
Creatinine, Ser: 0.94 mg/dL (ref 0.57–1.00)
Glucose: 93 mg/dL (ref 70–99)
Potassium: 4.8 mmol/L (ref 3.5–5.2)
Sodium: 144 mmol/L (ref 134–144)
eGFR: 66 mL/min/{1.73_m2} (ref >59)

## 2023-05-18 LAB — PRO B NATRIURETIC PEPTIDE: NT-Pro BNP: 712 pg/mL — ABNORMAL HIGH (ref 0–301)

## 2023-05-19 ENCOUNTER — Ambulatory Visit: Payer: Medicare HMO | Attending: Cardiology

## 2023-05-19 DIAGNOSIS — R0789 Other chest pain: Secondary | ICD-10-CM

## 2023-05-19 LAB — MYOCARDIAL PERFUSION IMAGING
LV dias vol: 85 mL (ref 46–106)
LV sys vol: 35 mL
Nuc Stress EF: 59 %
Peak HR: 66 {beats}/min
Rest HR: 53 {beats}/min
Rest Nuclear Isotope Dose: 10.6 mCi
SDS: 0
SRS: 1
SSS: 1
Stress Nuclear Isotope Dose: 30.3 mCi
TID: 0.82

## 2023-05-19 MED ORDER — TECHNETIUM TC 99M TETROFOSMIN IV KIT
30.3000 | PACK | Freq: Once | INTRAVENOUS | Status: AC | PRN
  Administered 2023-05-19: 30.3 via INTRAVENOUS

## 2023-05-19 MED ORDER — REGADENOSON 0.4 MG/5ML IV SOLN
0.4000 mg | Freq: Once | INTRAVENOUS | Status: AC
  Administered 2023-05-19: 0.4 mg via INTRAVENOUS

## 2023-05-19 MED ORDER — TECHNETIUM TC 99M TETROFOSMIN IV KIT
10.6000 | PACK | Freq: Once | INTRAVENOUS | Status: AC | PRN
  Administered 2023-05-19: 10.6 via INTRAVENOUS

## 2023-05-25 ENCOUNTER — Telehealth: Payer: Self-pay

## 2023-05-25 NOTE — Telephone Encounter (Signed)
Spoke with pt about her labs and adding Lasix 20 mg per Dr. Vanetta Shawl note on labs. Pt already takes Lasix 40mg  in the AM and 20mg  in the PM. She currently has some mild leg and feet swelling and shortness of breath. She does not take any potassium. Will send to Dr. Bing Matter for further direction.

## 2023-05-27 ENCOUNTER — Telehealth: Payer: Self-pay | Admitting: Cardiology

## 2023-05-27 NOTE — Telephone Encounter (Signed)
Patient wants call back to discuss stress test results and next steps regarding her CHF diagnosis.

## 2023-05-30 NOTE — Telephone Encounter (Signed)
Spoke with pt and advised per Dr. Vanetta Shawl note to increase lasix to 40mg  in the morning and 40mg  in the afternoon and come for BMP in 1 week. Pt's swelling and shortness of breath has not worsened and will advise if her symptoms worsen at any time.

## 2023-05-30 NOTE — Addendum Note (Signed)
Addended by: Gypsy Balsam on: 05/30/2023 03:16 PM   Modules accepted: Orders

## 2023-06-03 ENCOUNTER — Telehealth: Payer: Self-pay | Admitting: Cardiology

## 2023-06-03 NOTE — Telephone Encounter (Signed)
Pt stated that she was feeling weak taking the increased dose of Lasix. She stated that she only took one this morning. She reported that it did help her fluid in her legs. She will continue on one and increase to 2 as needed if her legs begin to swell again. Encouraged her to drink plenty of fluids and eat well. She will be in for blood work on Monday. She verbalized understanding and had no further questions.

## 2023-06-03 NOTE — Telephone Encounter (Signed)
Pt was put on Lasix and it is making her feel so weak. She did not take it this morning.

## 2023-06-06 DIAGNOSIS — E782 Mixed hyperlipidemia: Secondary | ICD-10-CM | POA: Diagnosis not present

## 2023-06-07 ENCOUNTER — Telehealth: Payer: Self-pay

## 2023-06-07 LAB — LIPID PANEL
Chol/HDL Ratio: 2 {ratio} (ref 0.0–4.4)
Cholesterol, Total: 129 mg/dL (ref 100–199)
HDL: 64 mg/dL (ref >39)
LDL Chol Calc (NIH): 43 mg/dL (ref 0–99)
Triglycerides: 131 mg/dL (ref 0–149)
VLDL Cholesterol Cal: 22 mg/dL (ref 5–40)

## 2023-06-07 LAB — ALT: ALT: 11 [IU]/L (ref 0–32)

## 2023-06-07 LAB — AST: AST: 20 [IU]/L (ref 0–40)

## 2023-06-07 NOTE — Telephone Encounter (Signed)
LM informing the patient of results. Ok per [employer]

## 2023-06-07 NOTE — Telephone Encounter (Signed)
-----   Message from [facility] Ozzie Knobel P sent at 06/06/2023  5:29 PM EST -----  ----- Message ----- From: Georgeanna Lea, MD Sent: 05/31/2023   8:19 PM EST To: Neena Rhymes, RN  Stress test negative

## 2023-06-13 NOTE — Telephone Encounter (Signed)
Made patient aware that lab results have not been resulted by Dr Bing Matter at this time. Patient verbalized understanding and had no further questions.

## 2023-06-13 NOTE — Telephone Encounter (Signed)
Patient is returning call.  °

## 2023-06-14 NOTE — Telephone Encounter (Signed)
Patient is requesting call back in regards to labs and for an update.

## 2023-06-14 NOTE — Telephone Encounter (Signed)
Advised that labs are normal and once Dr. Bing Matter reviews we will call and advise if any changes are needed. Pt verbalized understanding and had no additional questions.

## 2023-06-16 DIAGNOSIS — M47812 Spondylosis without myelopathy or radiculopathy, cervical region: Secondary | ICD-10-CM | POA: Diagnosis not present

## 2023-06-16 DIAGNOSIS — G5620 Lesion of ulnar nerve, unspecified upper limb: Secondary | ICD-10-CM | POA: Diagnosis not present

## 2023-06-16 DIAGNOSIS — G5600 Carpal tunnel syndrome, unspecified upper limb: Secondary | ICD-10-CM | POA: Diagnosis not present

## 2023-06-28 ENCOUNTER — Inpatient Hospital Stay: Payer: Medicare HMO | Attending: Oncology

## 2023-06-28 ENCOUNTER — Telehealth: Payer: Self-pay | Admitting: Gastroenterology

## 2023-06-28 DIAGNOSIS — Z79899 Other long term (current) drug therapy: Secondary | ICD-10-CM | POA: Diagnosis not present

## 2023-06-28 DIAGNOSIS — Z85038 Personal history of other malignant neoplasm of large intestine: Secondary | ICD-10-CM | POA: Diagnosis not present

## 2023-06-28 DIAGNOSIS — R970 Elevated carcinoembryonic antigen [CEA]: Secondary | ICD-10-CM | POA: Insufficient documentation

## 2023-06-28 DIAGNOSIS — Z7982 Long term (current) use of aspirin: Secondary | ICD-10-CM | POA: Insufficient documentation

## 2023-06-28 DIAGNOSIS — R109 Unspecified abdominal pain: Secondary | ICD-10-CM | POA: Diagnosis not present

## 2023-06-28 DIAGNOSIS — I509 Heart failure, unspecified: Secondary | ICD-10-CM | POA: Insufficient documentation

## 2023-06-28 DIAGNOSIS — R978 Other abnormal tumor markers: Secondary | ICD-10-CM

## 2023-06-28 LAB — CBC WITH DIFFERENTIAL (CANCER CENTER ONLY)
Abs Immature Granulocytes: 0.14 10*3/uL — ABNORMAL HIGH (ref 0.00–0.07)
Basophils Absolute: 0.1 10*3/uL (ref 0.0–0.1)
Basophils Relative: 1 %
Eosinophils Absolute: 0.3 10*3/uL (ref 0.0–0.5)
Eosinophils Relative: 4 %
HCT: 38.2 % (ref 36.0–46.0)
Hemoglobin: 13.1 g/dL (ref 12.0–15.0)
Immature Granulocytes: 2 %
Lymphocytes Relative: 19 %
Lymphs Abs: 1.5 10*3/uL (ref 0.7–4.0)
MCH: 29.9 pg (ref 26.0–34.0)
MCHC: 34.3 g/dL (ref 30.0–36.0)
MCV: 87.2 fL (ref 80.0–100.0)
Monocytes Absolute: 0.7 10*3/uL (ref 0.1–1.0)
Monocytes Relative: 9 %
Neutro Abs: 5.2 10*3/uL (ref 1.7–7.7)
Neutrophils Relative %: 65 %
Platelet Count: 274 10*3/uL (ref 150–400)
RBC: 4.38 MIL/uL (ref 3.87–5.11)
RDW: 13.3 % (ref 11.5–15.5)
WBC Count: 7.9 10*3/uL (ref 4.0–10.5)
nRBC: 0 % (ref 0.0–0.2)
nRBC: 0 /100{WBCs}

## 2023-06-28 LAB — CMP (CANCER CENTER ONLY)
ALT: 13 U/L (ref 0–44)
AST: 23 U/L (ref 15–41)
Albumin: 3.6 g/dL (ref 3.5–5.0)
Alkaline Phosphatase: 168 U/L — ABNORMAL HIGH (ref 38–126)
Anion gap: 11 (ref 5–15)
BUN: 12 mg/dL (ref 8–23)
CO2: 23 mmol/L (ref 22–32)
Calcium: 9.7 mg/dL (ref 8.9–10.3)
Chloride: 103 mmol/L (ref 98–111)
Creatinine: 1.7 mg/dL — ABNORMAL HIGH (ref 0.44–1.00)
GFR, Estimated: 32 mL/min — ABNORMAL LOW (ref >60)
Glucose, Bld: 111 mg/dL — ABNORMAL HIGH (ref 70–99)
Potassium: 3.8 mmol/L (ref 3.5–5.1)
Sodium: 137 mmol/L (ref 135–145)
Total Bilirubin: 0.5 mg/dL (ref 0.0–1.2)
Total Protein: 6.3 g/dL — ABNORMAL LOW (ref 6.5–8.1)

## 2023-06-28 LAB — CEA (ACCESS): CEA (CHCC): 4.8 ng/mL (ref 0.00–5.00)

## 2023-06-28 NOTE — Telephone Encounter (Signed)
 Patient called and stated that she is having abdominal discomfort. Patient also stated that whenever she wears any type of pants around her waist it causes her to have more discomfort. Patient is requesting a call back. Please advise.

## 2023-06-28 NOTE — Telephone Encounter (Signed)
 The pt last seen 2023 is calling with complaints of abd discomfort, nausea and bloating.  She is on Pepcid  at bedtime, aciphex  daily. Symptoms began several weeks ago.   Appt offered for tomorrow but pt declines-she has another appt.  She has been set up for 07/08/23 at 11 am with Vina Dasen NP

## 2023-06-29 ENCOUNTER — Telehealth: Payer: Self-pay | Admitting: Emergency Medicine

## 2023-06-29 ENCOUNTER — Other Ambulatory Visit: Payer: Medicare HMO

## 2023-06-29 DIAGNOSIS — I2510 Atherosclerotic heart disease of native coronary artery without angina pectoris: Secondary | ICD-10-CM | POA: Diagnosis not present

## 2023-06-29 NOTE — Telephone Encounter (Signed)
 Patient here to get blood work. Per Dr. Gordan Latina note on 05/17/2023. Patient was to return in 6 weeks for blood work. Labs ordered as per Dr. Tonja Fray note.

## 2023-06-30 LAB — LIPID PANEL
Chol/HDL Ratio: 2.4 {ratio} (ref 0.0–4.4)
Cholesterol, Total: 88 mg/dL — ABNORMAL LOW (ref 100–199)
HDL: 37 mg/dL — ABNORMAL LOW (ref >39)
LDL Chol Calc (NIH): 31 mg/dL (ref 0–99)
Triglycerides: 107 mg/dL (ref 0–149)
VLDL Cholesterol Cal: 20 mg/dL (ref 5–40)

## 2023-06-30 LAB — ALT: ALT: 12 [IU]/L (ref 0–32)

## 2023-06-30 LAB — AST: AST: 19 [IU]/L (ref 0–40)

## 2023-06-30 NOTE — Progress Notes (Signed)
[facility]  [address] [place],  [place]  [address] [phone]  Clinic Day: 07/01/23  Referring physician: Jerrye Bushy, FNP  CHIEF COMPLAINT:  CC:  History of stage IIIB colon cancer with mild elevation of the CEA  Current Treatment:   Observation  HISTORY OF PRESENT ILLNESS:  [name] is a 68 y.o. 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  She was treated with surgical resection.  Pathology revealed a 3.6 centimeter, grade 2, adenocarcinoma with 3 of 45 nodes positive for metastasis.  She received adjuvant chemotherapy with FOLFOX for 5 cycles.  Oxaliplatin was then discontinued because of severe cytopenias.  She continued 5 fluorouracil and leucovorin for 4 more cycles, but then developed a severe allergic reaction to the 5-FU.  She was then placed on Xeloda for 6 weeks to complete a full 6 months of adjuvant chemotherapy.  She also had difficulty tolerating the Xeloda.  She has not had evidence of recurrence.  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  The patient is a nonsmoker.  She had a CT abdomen/pelvis in June 2016 for abdominal pain, but this was negative other than a small hiatal hernia.  MRI of the lumbar spine in July 2019 revealed left sided neural impingement at L1-2 with multilevel spondylosis. She had spinal surgery with Dr. Precious Gilding.  Her CEA was mildly elevated in April 2021 at 5.5. CT chest, abdomen and pelvis at that time did not reveal any evidence of malignancy. The CEA was 5 in July, then 5.2 in October. Colonoscopy with Dr. Chales Abrahams in December 2021 was unremarkable. Repeat in 5 years was recommended.   The CEA went up to 7.5 in January 2022, and went down to 7.0 in February. CT chest, abdomen and pelvis  from February did not reveal any evidence of local recurrence of metastatic disease. There was a stable moderate to large hiatal hernia noted.  The CEA was down to 5.6 in May, then 5.2 in August. She states she has 40% coronary artery blockage, for which she is on aspirin 81 mg daily, metoprolol 25 mg twice daily, nitroglycerin prn, and rosuvastatin 20 mg daily through Dr. Bing Matter. CT chest, abdomen and pelvis from November 9th of 2022 revealed no findings to suggest metastatic disease. CEA went back up to 5.9 in November of 2022. CEA was 4.5 in April 2023, the best it's been, and first normal reading since January of 2022. She has never been a smoker.  INTERVAL HISTORY:  [name] is here for routine follow up for her remote history of stage IIIB colon cancer with mild chronic elevation of the CEA. Patient states that she feels ok and has no complaints of pain at this moment. She did inform me that she has been experiencing stomach pain for the past week and has a upcoming appointment with Dr. Chales Abrahams for further evaluation.  She also informed me that she was recently diagnosed with congestive heart failure. She has now been placed on Lasix 40 mg BID and began feeling too weak so is now on just once daily. She follows up with Dr. Bing Matter. She had labs done on 06/28/2023 and has a WBC of 7.9, hemoglobin of 13.1, and platelet count of 274,000. Her CMP is  normal other than an elevated creatinine of 1.70 and a low total protein of 6.3. Her CEA had improved from 8.3 to 5.2, and now to 4.80. She will return in 6 months for repeat CEA. I will see her back in a year with CBC, CMP, and CEA.   A CT scan from September 30, 2022 revealed new clustered nodularity, posteriorly in the lingula with nodules up to 10mm and 13mm. A repeat was done on December 28, 2022 which showed slightly diminished size but increased consolidated appearance of heterogenous air space opacity and nodularity and the posterior lingula abutting the fissure.  This measured 2.9cm and was previously 3.2cm in maximum diameter. This is most likely a non-specific infectious/inflammatory process. We will plan to monitor periodically and pursue further evaluation if this worsens or she becomes symptomatic. She also has a large hiatal hernia with the gastric body fundus in an intra thoracic position. She also had cardiomegaly and coronary artery disease.   She denies signs of infection such as sore throat, sinus drainage, cough, or urinary symptoms.  She denies fevers or recurrent chills. She denies pain. She denies nausea, vomiting, chest pain, dyspnea or cough. Her appetite is good and her weight has decreased 10 pounds over last month .  REVIEW OF SYSTEMS:  Review of Systems  Constitutional: Negative.  Negative for appetite change, chills, diaphoresis, fatigue, fever and unexpected weight change.  HENT:  Negative.  Negative for hearing loss, lump/mass, mouth sores, nosebleeds, sore throat, tinnitus, trouble swallowing and voice change.   Eyes: Negative.  Negative for eye problems and icterus.  Respiratory: Negative.  Negative for chest tightness, cough, hemoptysis, shortness of breath and wheezing.   Cardiovascular: Negative.  Negative for chest pain, leg swelling and palpitations.  Gastrointestinal: Negative.  Negative for abdominal distention, abdominal pain, blood in stool, constipation, diarrhea, nausea, rectal pain and vomiting.       Acid reflux  Endocrine: Negative.   Genitourinary: Negative.  Negative for bladder incontinence, difficulty urinating, dyspareunia, dysuria, frequency, hematuria, menstrual problem, nocturia, pelvic pain, vaginal bleeding and vaginal discharge.   Musculoskeletal:  Negative for arthralgias, back pain, flank pain, gait problem, myalgias, neck pain and neck stiffness.       Sciatica pain in her right hip/leg  Skin: Negative.  Negative for itching, rash and wound.  Neurological:  Positive for numbness (fingers/feet). Negative  for dizziness, extremity weakness, gait problem, headaches, light-headedness, seizures and speech difficulty.  Hematological: Negative.  Negative for adenopathy. Does not bruise/bleed easily.  Psychiatric/Behavioral:  Negative for confusion, decreased concentration, depression, sleep disturbance and suicidal ideas. The patient is nervous/anxious.       VITALS:  Blood pressure 113/79, pulse (!) 57, temperature 97.7 F (36.5 C), temperature source Oral, resp. rate 18, height 5' (1.524 m), weight 158 lb 11.2 oz (72 kg), SpO2 98%.  Wt Readings from Last 3 Encounters:  07/01/23 158 lb 11.2 oz (72 kg)  05/19/23 168 lb (76.2 kg)  05/17/23 168 lb (76.2 kg)    Body mass index is 30.99 kg/m.  Performance status (ECOG): 0 - Asymptomatic  PHYSICAL EXAM:  Physical Exam Vitals and nursing note reviewed.  Constitutional:      General: She is not in acute distress.    Appearance: Normal appearance. She is normal weight. She is not ill-appearing, toxic-appearing or diaphoretic.  HENT:     Head: Normocephalic and atraumatic.     Right Ear: Tympanic membrane, ear canal and external ear normal. There is no impacted cerumen.  Left Ear: Tympanic membrane, ear canal and external ear normal. There is no impacted cerumen.     Nose: Nose normal. No congestion or rhinorrhea.     Mouth/Throat:     Mouth: Mucous membranes are moist.     Pharynx: Oropharynx is clear. No oropharyngeal exudate or posterior oropharyngeal erythema.  Eyes:     General: No scleral icterus.       Right eye: No discharge.        Left eye: No discharge.     Extraocular Movements: Extraocular movements intact.     Conjunctiva/sclera: Conjunctivae normal.     Pupils: Pupils are equal, round, and reactive to light.  Neck:     Vascular: No carotid bruit.  Cardiovascular:     Rate and Rhythm: Regular rhythm. Bradycardia present.     Pulses: Normal pulses.     Heart sounds: Normal heart sounds. No murmur heard.    No friction  rub. No gallop.  Pulmonary:     Effort: Pulmonary effort is normal. No respiratory distress.     Breath sounds: Normal breath sounds. No stridor. No wheezing, rhonchi or rales.  Chest:     Chest wall: No tenderness.  Abdominal:     General: Bowel sounds are normal. There is no distension.     Palpations: Abdomen is soft. There is no hepatomegaly, splenomegaly or mass.     Tenderness: There is no abdominal tenderness. There is no right CVA tenderness, left CVA tenderness, guarding or rebound.     Hernia: No hernia is present.  Musculoskeletal:        General: No swelling, tenderness, deformity or signs of injury. Normal range of motion.     Cervical back: Normal range of motion and neck supple. No rigidity or tenderness.     Right lower leg: No edema.     Left lower leg: No edema.  Lymphadenopathy:     Cervical: No cervical adenopathy.  Skin:    General: Skin is warm and dry.     Coloration: Skin is not jaundiced or pale.     Findings: No bruising, erythema, lesion or rash.  Neurological:     General: No focal deficit present.     Mental Status: She is alert and oriented to person, place, and time. Mental status is at baseline.     Cranial Nerves: No cranial nerve deficit.     Sensory: No sensory deficit.     Motor: No weakness.     Coordination: Coordination normal.     Gait: Gait normal.     Deep Tendon Reflexes: Reflexes normal.  Psychiatric:        Mood and Affect: Mood normal.        Behavior: Behavior normal.        Thought Content: Thought content normal.        Judgment: Judgment normal.     LABS:      Latest Ref Rng & Units 06/28/2023    9:06 AM 12/31/2022   12:00 AM 12/28/2022   12:00 AM  CBC  WBC 4.0 - 10.5 K/uL 7.9  5.6  6.0      Hemoglobin 12.0 - 15.0 g/dL 16.1  09.6  04.5      Hematocrit 36.0 - 46.0 % 38.2  38.0  38      Platelets 150 - 400 K/uL 274  152  149         This result is from an external source.  Latest Ref Rng & Units 06/29/2023    9:03  AM 06/28/2023    9:06 AM 06/06/2023    8:33 AM  CMP  Glucose 70 - 99 mg/dL  403    BUN 8 - 23 mg/dL  12    Creatinine 4.74 - 1.00 mg/dL  2.59    Sodium 563 - 875 mmol/L  137    Potassium 3.5 - 5.1 mmol/L  3.8    Chloride 98 - 111 mmol/L  103    CO2 22 - 32 mmol/L  23    Calcium 8.9 - 10.3 mg/dL  9.7    Total Protein 6.5 - 8.1 g/dL  6.3    Total Bilirubin 0.0 - 1.2 mg/dL  0.5    Alkaline Phos 38 - 126 U/L  168    AST 0 - 40 IU/L 19  23  20    ALT 0 - 32 IU/L 12  13  11     Lab Results  Component Value Date   CEA1 8.3 (H) 09/22/2022   CEA 4.80 06/28/2023   /  CEA  Date Value Ref Range Status  09/22/2022 8.3 (H) 0.0 - 4.7 ng/mL Final    Comment:    (NOTE)                             Nonsmokers          <3.9                             Smokers             <5.6 Roche [employer] Electrochemiluminescence Immunoassay (ECLIA) Values obtained with different assay methods or kits cannot be used interchangeably.  Results cannot be interpreted as absolute evidence of the presence or absence of malignant disease. Performed At: [facility] [address] [place], [place] [id number] Jolene Schimke MD AC:[id number]    CEA [facility])  Date Value Ref Range Status  06/28/2023 4.80 0.00 - 5.00 ng/mL Final    Comment:    (NOTE) This test was performed using [employer] Coulter's paramagnetic chemiluminescent immunoassay. Values obtained from different assay methods cannot be used interchangeably. Please note that up to 8% of patients who smoke may see values 5.1-10.0 ng/ml and 1% of patients who smoke may see CEA levels >10.0 ng/ml. Performed at [facility], [address], [place], [place] [address]    STUDIES:  No Studies Found.    HISTORY:   Allergies:  Allergies  Allergen Reactions   Penicillins Rash    Has patient had a PCN reaction causing immediate rash, facial/tongue/throat swelling, SOB or lightheadedness with hypotension: Yes Has patient had a  PCN reaction causing severe rash involving mucus membranes or skin necrosis: No Has patient had a PCN reaction that required hospitalization: No Has patient had a PCN reaction occurring within the last 10 years: No If all of the above answers are "NO", then may proceed with Cephalosporin use.    Codeine Other (See Comments)    Dizziness and lethargy   Aciphex [Rabeprazole] Rash   Guaifenesin & Derivatives Rash   Latex Rash    Usually from adhesive tapes   Other Rash    Tape cannot be on the skin for an extended period of time   Pseudoephedrine Rash   Sulfa Antibiotics Rash   Sulfasalazine Rash   Tape Rash    Cannot be on  the skin for an extended period of time    Current Medications: Current Outpatient Medications  Medication Sig Dispense Refill   aspirin EC 81 MG tablet Take 81 mg by mouth daily.     B Complex-Folic Acid (B COMPLEX-VITAMIN B12 PO) Take 1 tablet by mouth daily. Unknown strength     CALCIUM PO Take 2 tablets by mouth daily. Unknown strength     Cholecalciferol (VITAMIN D3) 2000 units TABS Take 2,000 Units by mouth daily.     CINNAMON PO Take 1 tablet by mouth daily. Unknown strength     dexlansoprazole (DEXILANT) 60 MG capsule TAKE 1  BY MOUTH ONCE DAILY (Patient taking differently: Take 60 mg by mouth daily.) 90 capsule 0   DULoxetine (CYMBALTA) 60 MG capsule Take 60 mg by mouth daily.  1   famotidine (PEPCID) 20 MG tablet TAKE 1 TABLET BY MOUTH AT BEDTIME 90 tablet 0   furosemide (LASIX) 20 MG tablet Take 1 tablet (20 mg total) by mouth daily. 90 tablet 2   gabapentin (NEURONTIN) 800 MG tablet Take 800 mg by mouth 3 (three) times daily.     meloxicam (MOBIC) 15 MG tablet Take 15 mg by mouth daily.     metoprolol succinate (TOPROL XL) 50 MG 24 hr tablet Take 1 tablet (50 mg total) by mouth 2 (two) times daily. Take with or immediately following a meal. 180 tablet 3   Multiple Vitamins-Minerals (CENTRUM SILVER 50+WOMEN) TABS Take 1 tablet by mouth daily. Unknown  strength     nitroGLYCERIN (NITROSTAT) 0.4 MG SL tablet Place 1 tablet (0.4 mg total) under the tongue every 5 (five) minutes x 3 doses as needed for chest pain. 25 tablet 2   ofloxacin (OCUFLOX) 0.3 % ophthalmic solution Place 1 drop into the left eye 4 (four) times daily.     prednisoLONE acetate (PRED FORTE) 1 % ophthalmic suspension Place 1 drop into the left eye in the morning, at noon, in the evening, and at bedtime.     RABEprazole (ACIPHEX) 20 MG tablet Take 20 mg by mouth daily.     ranolazine (RANEXA) 500 MG 12 hr tablet Take 1 tablet (500 mg total) by mouth 2 (two) times daily. 180 tablet 3   rosuvastatin (CRESTOR) 40 MG tablet Take 1 tablet (40 mg total) by mouth daily. 90 tablet 3   Current Facility-Administered Medications  Medication Dose Route Frequency Provider Last Rate Last Admin   0.9 %  sodium chloride infusion  500 mL Intravenous Once Lynann Bologna, MD       0.9 %  sodium chloride infusion  500 mL Intravenous Once Lynann Bologna, MD       ASSESSMENT & PLAN:  Assessment:   1. History of stage IIIB colon cancer diagnosed in December 2012.  The patient remains without obvious evidence of recurrence.  Colonoscopy in December 2021 was unremarkable and she will be due repeat examination in 2026.   2. Mild elevation of the CEA of uncertain etiology.  CT imaging in February and November 2022 did not reveal any evidence of malignancy.  CEA was in the normal range in April 2023 but then went back up again a little in September and October.  However, it increased to 8.3 in April, 2024, but back down to 5.2 in July, 2024 and now down to 4.8 in January, 2025. I will simply repeat it again in 6 months and see her in 1 year.    3. Moderate to large hiatal hernia. Dr.  Chales Abrahams performed an EGD in October, 2023 with findings of a 6 cm hiatal hernia and diffuse inflammation. CT in July, 2024 revealed a large hiatal hernia with the gastric body fundus in an intra thoracic position.  4. Nodularity  in the lingula noted in April, 2024.  A repeat CT was done on December 28, 2022 which showed slightly diminished size but increased consolidated appearance of heterogenous air space opacity and nodularity and the posterior lingula abutting the fissure. This measured 2.9cm and was previously 3.2cm in maximum diameter. This is most likely a non-specific infectious/inflammatory process. I wonder if this could represent aspiration pneumonia due to her large hiatal hernia and reflux, since it does not look typical for a primary lung cancer or a metastatic lesion. We will plan to monitor periodically and pursue further evaluation if this worsens or she becomes symptomatic.   5. 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 Precious Gilding.  6. Congestive heart failure. CT scan in July, 2024 revealed cardiomegaly and coronary artery disease.   Plan:   She has a upcoming appointment with Dr. Chales Abrahams for further evaluation of her stomach pain.  She also informed me that she was recently diagnosed with congestive heart failure. She has now been placed on Lasix 40mg  BID and began feeling too weak so is now on just once daily. She follows up with Dr. Bing Matter. She had labs done on 06/28/2023 and has a WBC of 7.9, hemoglobin of 13.1, and platelet count of 274,000. Her CMP is normal other than a elevated creatinine of 1.70 and a low total protein of 6.3. Her CEA had improved from 8.3 to 5.2, and now to 4.80. She will return in 6 months for repeat CEA. I will see her back in a year with CBC, CMP, and CEA. The patient understands the plans discussed today and is in agreement with them.  She knows to contact our office if she develops concerns prior to her next appointment.  Addendum: After the patient left, I reviewed her CT scan from July in detail and the area in the posterior lingular appears improved but not resolved. I still think this could represent aspiration from her large hiatal  hernia and do not suspect malignancy. However, this should be repeated periodically and I will discuss that with her as to when to schedule her next one.   I provided 13 minutes of face-to-face time during this this encounter and > 50% was spent counseling as documented under my assessment and plan.  Dellia Beckwith, MD  [facility] [address] [place] [address] Dept: [phone] Dept Fax: [phone]   No orders of the defined types were placed in this encounter.  I,Jasmine M Lassiter,acting as a scribe for Dellia Beckwith, MD.,have documented all relevant documentation on the behalf of Dellia Beckwith, MD,as directed by  Dellia Beckwith, MD while in the presence of Dellia Beckwith, MD.

## 2023-07-01 ENCOUNTER — Other Ambulatory Visit: Payer: Self-pay | Admitting: Oncology

## 2023-07-01 ENCOUNTER — Inpatient Hospital Stay (HOSPITAL_BASED_OUTPATIENT_CLINIC_OR_DEPARTMENT_OTHER): Payer: Medicare HMO | Admitting: Oncology

## 2023-07-01 ENCOUNTER — Encounter: Payer: Self-pay | Admitting: Oncology

## 2023-07-01 VITALS — BP 113/79 | HR 57 | Temp 97.7°F | Resp 18 | Ht 60.0 in | Wt 158.7 lb

## 2023-07-01 DIAGNOSIS — I509 Heart failure, unspecified: Secondary | ICD-10-CM | POA: Diagnosis not present

## 2023-07-01 DIAGNOSIS — C182 Malignant neoplasm of ascending colon: Secondary | ICD-10-CM

## 2023-07-01 DIAGNOSIS — R978 Other abnormal tumor markers: Secondary | ICD-10-CM | POA: Diagnosis not present

## 2023-07-01 DIAGNOSIS — Z7982 Long term (current) use of aspirin: Secondary | ICD-10-CM | POA: Diagnosis not present

## 2023-07-01 DIAGNOSIS — R109 Unspecified abdominal pain: Secondary | ICD-10-CM | POA: Diagnosis not present

## 2023-07-01 DIAGNOSIS — Z79899 Other long term (current) drug therapy: Secondary | ICD-10-CM | POA: Diagnosis not present

## 2023-07-01 DIAGNOSIS — R970 Elevated carcinoembryonic antigen [CEA]: Secondary | ICD-10-CM | POA: Diagnosis not present

## 2023-07-01 DIAGNOSIS — Z85038 Personal history of other malignant neoplasm of large intestine: Secondary | ICD-10-CM | POA: Diagnosis not present

## 2023-07-04 ENCOUNTER — Telehealth: Payer: Self-pay | Admitting: Cardiology

## 2023-07-04 NOTE — Telephone Encounter (Signed)
Called patient and reviewed her latest lab work with her. 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 Dr. Dulce Sellar was not sure what the term "dry kidney" meant. I recommended that she contact Dr. Gilman Buttner to find out what she meant by the term "dry kidney". Patient verbalized understanding and had no further questions at this time.

## 2023-07-04 NOTE — Telephone Encounter (Signed)
Patient called to follow-up on lab results and stated her cancer doctor suggested she may have "dry kidney".  Patient wants a call back to discuss next steps.

## 2023-07-05 ENCOUNTER — Telehealth: Payer: Self-pay

## 2023-07-05 NOTE — Telephone Encounter (Signed)
Patient has called wanting to know what you meant by dry kidneys.

## 2023-07-05 NOTE — Telephone Encounter (Signed)
Patient notified of message

## 2023-07-08 ENCOUNTER — Ambulatory Visit: Payer: Medicare HMO | Admitting: Nurse Practitioner

## 2023-07-13 ENCOUNTER — Other Ambulatory Visit: Payer: Self-pay | Admitting: Oncology

## 2023-07-13 DIAGNOSIS — R911 Solitary pulmonary nodule: Secondary | ICD-10-CM

## 2023-07-13 NOTE — Addendum Note (Signed)
Addended byDyane Dustman on: 07/13/2023 11:39 AM   Modules accepted: Orders

## 2023-07-14 ENCOUNTER — Telehealth: Payer: Self-pay | Admitting: Oncology

## 2023-07-14 NOTE — Telephone Encounter (Signed)
Pt aware of CT scan details and MD FU has been scheduled w/ pt.

## 2023-07-14 NOTE — Telephone Encounter (Signed)
CT Chest has been scheduled for 07/19/23 @ 10:00 ; Checking in @ 9:30   LVM notifying pt of date,time and instructions. Also provided my direct line so that she can call to confirm this appt and to schedule a follow up w/ Dr. Gilman Buttner to discuss results.

## 2023-07-17 NOTE — Progress Notes (Signed)
 SUBJECTIVE   Patient ID: [name] is a 68 y.o. (DOB [birth year]) female with a past medical history of GERD, colon cancer, coronary artery disease, hypertension, anxiety, hyperlipidemia, who presents for evaluation of peripheral edema.  Chief Complaint  Patient presents with  . Edema   Edema: Patient complains of edema. The location of the edema is lower leg(s) bilateral.  The edema has been minimal since medications adjusted by cardiology..   Cardiac risk factors include advanced age (older than 53 for men, 38 for women), dyslipidemia, hypertension, sedentary lifestyle, and coronary artery disease..   OBJECTIVE   Allergies  Allergen Reactions  . Codeine Fatigue  . Latex Rash  . Penicillin G Rash  . Rabeprazole  Rash  . Sulfamethoxazole Rash    Current Outpatient Medications  Medication Sig Dispense Refill  . diclofenac (VOLTAREN) 75 mg EC tablet TAKE 1 TABLET BY MOUTH TWICE DAILY AFTER A MEAL    . rosuvastatin  (CRESTOR ) 40 mg tablet Take 1 tablet by mouth daily.    . aspirin  81 mg EC tablet Take 81 mg by mouth Once Daily.    . calcium  carbonate-vitamin D3 500 mg-3.125 mcg (125 unit) tab Take 2 tablets by mouth Once Daily.    . cholecalciferol (VITAMIN D3) 2,000 unit tablet Take 2,000 Units by mouth Once Daily.    . cinnamon 500 mg cap Take 1,000 mg by mouth Once Daily.    . cyproheptadine (PERIACTIN) 4 mg tablet Take 1 tablet by mouth nightly 90 tablet 1  . dexlansoprazole  (Dexilant ) 60 mg DR capsule Take 60 mg by mouth Once Daily.    . dilTIAZem  (CARDIZEM  CD) 120 mg 24 hr capsule Take 120 mg by mouth Once Daily.    . DULoxetine  (CYMBALTA ) 60 mg capsule Take 1 capsule (60 mg total) by mouth daily. 90 capsule 3  . famotidine  (PEPCID ) 20 mg tablet Take 20 mg by mouth Once Daily.    . fluorometholone (FML LIQUIFILM) 0.1 % drps ophthalmic suspension     . furosemide  (LASIX ) 20 mg tablet Take 20 mg by mouth Once Daily.    SABRA gabapentin (NEURONTIN) 300 mg capsule Take 1  capsule (300 mg total) by mouth in the morning and 1 capsule (300 mg total) at noon and 1 capsule (300 mg total) in the evening. 270 capsule 3  . hydrOXYzine pamoate (VISTARIL) 25 mg capsule TAKE 1 CAPSULE BY MOUTH NIGHTLY 30 capsule 0  . nitroglycerin  (Nitrostat ) 0.4 mg SL tablet Place 0.4 mg under the tongue every 5 (five) minutes as needed.    . ranolazine  (RANEXA ) 500 mg 12 hr tablet Take 500 mg by mouth 2 (two) times a day.     No current facility-administered medications for this visit.    Patient Active Problem List  Diagnosis  . Blurred vision, bilateral  . Pseudophakia of both eyes  . Dermatochalasis of eyelids of both eyes  . Conjunctivochalasis of both eyes  . Ametropia  . Retinal detachment, rhegmatogenous, left eye  . Vitreomacular traction syndrome of left eye  . Fusion of lumbar spine  . Overlapping malignant neoplasm of colon (HCC)  . Hoarseness  . Left leg pain  . Varicose veins of bilateral lower extremities with other complications  . Benign paroxysmal positional vertigo of right ear  . Vocal cord palsy  . Muscle tension dysphonia  . Laryngeal spasm  . Anxiety disorder  . Ascending aortic aneurysm (HCC)  . CAD (coronary artery disease)  . Depression  . Gastroesophageal reflux disease  .  Hyperlipidemia  . Insomnia  . Primary hypertension  . Dysphagia  . Other chronic pain    Past Medical History:  Diagnosis Date  . Asthma, unspecified asthma severity, unspecified whether complicated, unspecified whether persistent    Med Hx: Asthma;   . Bulging lumbar disc   . Colon cancer (CMD) 05/26/2011  . Coronary artery disease   . Degenerative joint disease of spine   . Diverticulitis 05/26/2011  . GERD (gastroesophageal reflux disease) 06/14/2002  . Hypertension   . Kidney stone   . OSA (obstructive sleep apnea)    no CPAP  . Overlapping malignant neoplasm of colon (CMD) 09/02/2020  . Plantar fasciitis       Past Surgical History:  Procedure  Laterality Date  . ANTERIOR FUSION LUMBAR SPINE N/A 08/21/2018   Procedure: L4-5 ANTERIOR/POSTERIOR FUSION W/ OBLIQUE LATERAL INTERBODY FUSION AND INSTRUMENTATION AND ALLOGRAFT;  Surgeon: Royden Elsie Schneider, MD;  Location: [facility] MAIN OR;  Service: Orthopedics;  Laterality: N/A;  Supine, C-Arm, O-Arm, ProAxis, [employer], Justin, PACS on [facility]  . COLON SURGERY  2012   Procedure: COLON SURGERY; colectomy  . CYSTOSCOPY N/A 10/03/2012   Procedure: CYSTOSCOPY;  Surgeon: Sula Latch, MD;  Location: [facility] MAIN OR;  Service: Urology;  Laterality: N/A;  . HYSTERECTOMY      Procedure: HYSTERECTOMY  . INCISION & DRAINAGE SOFT TISSUE ABSCESS, SUBFASCIAL N/A 08/22/2018   Procedure: INCISION AND DRAINAGE SOFT TISSUE ABSCESS SUBFASCIAL;  Surgeon: Royden Elsie Schneider, MD;  Location: [facility] MAIN OR;  Service: Orthopedics;  Laterality: N/A;  . KIDNEY STONE SURGERY Right 10/03/2012   Procedure: URETEROSCOPIC STONE REMOVAL;  Surgeon: Sula Latch, MD;  Location[facility] MAIN OR;  Service: Urology;  Laterality: Right;  . LITHOTRIPSY     Procedure: LITHOTRIPSY    Family History  Problem Relation Name Age of Onset  . Hypertension Father    . Kidney disease Father    . Cancer Mother    . Heart attack Mother    . Diabetes Mother    . Arthritis Mother      BP 118/64 (BP Location: Left arm, Patient Position: Sitting)   Pulse 59   Ht 1.524 m (5')   Wt 74.4 kg (164 lb)   SpO2 98%   BMI 32.03 kg/m    Health Maintenance  Topic Date Due  . Bone Density Scan  Never done  . Hepatitis C Screening  Never done  . Hepatitis A Vaccines (1 of 2 - Risk 2-dose series) Never done  . Hepatitis B Vaccines (1 of 3 - Risk 3-dose series) Never done  . COVID-19 Vaccine (4 - 2024-25 season) 02/13/2023  . Depression Screening  08/16/2023  . Comprehensive Annual Visit  02/16/2024  . Medicare Annual Wellness (AWV) Subsequent Visits  02/16/2024  . Diabetes Screening  02/16/2024  . Breast Cancer Screening (Mammogram)   03/28/2025  . DTaP/Tdap/Td Vaccines (3 - Td or Tdap) 02/10/2032  . Adult RSV (60+ Years or Pregnancy)  Completed  . Influenza Vaccine  Completed  . Pneumococcal Vaccine for Ages 65+  Completed  . ZOSTER VACCINE  Completed  . HIB Vaccines  Aged Out  . IPV Vaccines  Aged Out  . Meningococcal Conjugate (ACWY) Vaccine  Aged Out  . Rotavirus Vaccines  Aged Out  . HPV Vaccines  Aged Out  . Medicare Annual Wellness (AWV) Initial Visit  Discontinued  . Colorectal Cancer Screening  Discontinued    Review of Systems A complete ROS was performed with positive/negative pertinent findings listed  in the HPI. All other systems are negative.  Review of Systems  Constitutional:  Positive for fatigue.  HENT: Negative.    Eyes: Negative.   Respiratory: Negative.    Cardiovascular: Negative.   Gastrointestinal: Negative.   Endocrine: Negative.   Genitourinary: Negative.   Musculoskeletal: Negative.   Skin: Negative.   Allergic/Immunologic: Negative.   Neurological: Negative.   Hematological: Negative.   Psychiatric/Behavioral: Negative.      Constitutional:  NAD.  Appears well-developed, well groomed and well nourished. HEENT: Oropharynx with moist mucous membranes Neck: No cervical adenopathy.  Cardiovascular: Normal rate and rhythm.  Pulmonary/chest: Effort normal and breath sounds normal. Lungs clear to auscultation, no use of accessory muscles. No wheezes. No rales.  Extremities: No edema bilaterally.  Skin: Warm and dry. No rashes noted.  Neuro:  Alert and oriented x 3, CN grossly intact.  Psychiatric: Normal mood and affect. Behavior normal. Judgement and thought content normal.    ASSESSMENT/PLAN   1. Peripheral edema (Primary) -Strict DASH diet - Comprehensive Metabolic Panel; Future - furosemide  (LASIX ) 20 mg tablet; Take 1 tablet (20 mg total) by mouth 2 (two) times a day.  Dispense: 180 tablet; Refill: 3 - Comprehensive Metabolic Panel   Outpatient Encounter Medications  as of 07/18/2023  Medication Sig Dispense Refill  . diclofenac (VOLTAREN) 75 mg EC tablet TAKE 1 TABLET BY MOUTH TWICE DAILY AFTER A MEAL    . rosuvastatin  (CRESTOR ) 40 mg tablet Take 1 tablet by mouth daily.    . aspirin  81 mg EC tablet Take 81 mg by mouth Once Daily.    . calcium  carbonate-vitamin D3 500 mg-3.125 mcg (125 unit) tab Take 2 tablets by mouth Once Daily.    . cholecalciferol (VITAMIN D3) 2,000 unit tablet Take 2,000 Units by mouth Once Daily.    . cinnamon 500 mg cap Take 1,000 mg by mouth Once Daily.    . cyproheptadine (PERIACTIN) 4 mg tablet Take 1 tablet by mouth nightly 90 tablet 1  . dexlansoprazole  (Dexilant ) 60 mg DR capsule Take 60 mg by mouth Once Daily.    . dilTIAZem  (CARDIZEM  CD) 120 mg 24 hr capsule Take 120 mg by mouth Once Daily.    . DULoxetine  (CYMBALTA ) 60 mg capsule Take 1 capsule (60 mg total) by mouth daily. 90 capsule 3  . famotidine  (PEPCID ) 20 mg tablet Take 20 mg by mouth Once Daily.    . fluorometholone (FML LIQUIFILM) 0.1 % drps ophthalmic suspension     . furosemide  (LASIX ) 20 mg tablet Take 20 mg by mouth Once Daily.    SABRA gabapentin (NEURONTIN) 300 mg capsule Take 1 capsule (300 mg total) by mouth in the morning and 1 capsule (300 mg total) at noon and 1 capsule (300 mg total) in the evening. 270 capsule 3  . hydrOXYzine pamoate (VISTARIL) 25 mg capsule TAKE 1 CAPSULE BY MOUTH NIGHTLY 30 capsule 0  . nitroglycerin  (Nitrostat ) 0.4 mg SL tablet Place 0.4 mg under the tongue every 5 (five) minutes as needed.    . ranolazine  (RANEXA ) 500 mg 12 hr tablet Take 500 mg by mouth 2 (two) times a day.    . [DISCONTINUED] rosuvastatin  (CRESTOR ) 20 mg tablet Take 1 tablet (20 mg total) by mouth daily. 90 tablet 3   No facility-administered encounter medications on file as of 07/18/2023.   Risks, benefits, and alternatives of the medication(s) and treatment plan(s) were discussed, and she expressed understanding. Plan follow up as discussed or as needed. No barriers  to  treatment identified in this visit.     This document serves as a record of services personally performed by Dr. Lamar Simpers.  It was created on their behalf by Darryle Chester, a trained medical scribe, and Certified Medical Assistant (CMA). During the course of documenting the history, physical exam and medical decision making, I was functioning as a Stage manager. The creation of this record is the provider's dictation and/or activities during the visit.  Electronically signed by: Darryle Sherwin Chester, CMA 07/18/2023 12:18 PM   I agree the documentation is accurate and complete.  Electronically signed by: Lamar DELENA Simpers, MD 07/18/2023 12:26 PM

## 2023-07-18 DIAGNOSIS — R600 Localized edema: Secondary | ICD-10-CM | POA: Diagnosis not present

## 2023-07-19 DIAGNOSIS — R911 Solitary pulmonary nodule: Secondary | ICD-10-CM | POA: Diagnosis not present

## 2023-07-19 DIAGNOSIS — R918 Other nonspecific abnormal finding of lung field: Secondary | ICD-10-CM | POA: Diagnosis not present

## 2023-07-20 NOTE — Progress Notes (Signed)
 [facility]  [address] [place],  [place]  [address] [phone]  Clinic Day: 07/21/23  Referring physician: Silver Lamar LABOR, MD  CHIEF COMPLAINT:  CC:  History of stage IIIB colon cancer with mild elevation of the CEA  Current Treatment:   Observation  HISTORY OF PRESENT ILLNESS:  [name] is a 68 y.o. 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  She was treated with surgical resection.  Pathology revealed a 3.6 centimeter, grade 2, adenocarcinoma with 3 of 45 nodes positive for metastasis.  She received adjuvant chemotherapy with FOLFOX for 5 cycles.  Oxaliplatin was then discontinued because of severe cytopenias.  She continued 5 fluorouracil and leucovorin for 4 more cycles, but then developed a severe allergic reaction to the 5-FU.  She was then placed on Xeloda for 6 weeks to complete a full 6 months of adjuvant chemotherapy.  She also had difficulty tolerating the Xeloda.  She has not had evidence of recurrence.  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  The patient is a nonsmoker.  She had a CT abdomen/pelvis in June 2016 for abdominal pain, but this was negative other than a small hiatal hernia.  MRI of the lumbar spine in July 2019 revealed left sided neural impingement at L1-2 with multilevel spondylosis. She had spinal surgery with Dr. Marlyce.  Her CEA was mildly elevated in April 2021 at 5.5. CT chest, abdomen and pelvis at that time did not reveal any evidence of malignancy. The CEA was 5 in July, then 5.2 in October. Colonoscopy with Dr. Charlanne in December 2021 was unremarkable. Repeat in 5 years was recommended.   The CEA went up to 7.5 in January 2022, and went down to 7.0 in February. CT chest, abdomen and pelvis from February did not  reveal any evidence of local recurrence of metastatic disease. There was a stable moderate to large hiatal hernia noted.  The CEA was down to 5.6 in May, then 5.2 in August. She states she has 40% coronary artery blockage, for which she is on aspirin  81 mg daily, metoprolol  25 mg twice daily, nitroglycerin  prn, and rosuvastatin  20 mg daily through Dr. Krasowski. CT chest, abdomen and pelvis from November 9th of 2022 revealed no findings to suggest metastatic disease. CEA went back up to 5.9 in November of 2022. CEA was 4.5 in April 2023, the best it's been, and first normal reading since January of 2022. She has never been a smoker.  INTERVAL HISTORY:  [name] is here for routine follow up for her remote history of stage IIIB colon cancer with mild chronic elevation of the CEA. After the patient last's office visit I reviewed her CT scan from July 2024 in detail and the area in the posterior lingular appears improved but not resolved. I still think this could represent aspiration from her large hiatal hernia and do not suspect malignancy. However, this should be repeated periodically and so this was scheduled. Repeat CT chest done on 07/19/2023 revealed the lingular bandlike opacity likely represents subsegmental atelectasis and surrounding centrilobular tree-in-bud micro nodules suggest small airways disease. Pulmonary micro nodules measuring up to 3 mm as above and a 9 mm right hepatic hypodensity suboptimally characterized on this examination. No routine imaging was recommended  for her pulmonary micro nodules. I informed her that this scan is stable to improved compared to the last and that this looks more like sequelae from her asthma. She does note having asthma, cough, and occasionally hoarseness. Patient complains of severe back pain rating a 10/10. She has had 2 falls since her last visit with me but is doing better. She had a CMP done on 07/18/2023 which was normal other than a low total protein of 5.7.  Her last total protein with us  was 6.3 and I encouraged her to eat more protein in her diet.  She will return in 6 months for labs. I will see her back in a year with CBC, CMP, CEA, and I will add CT chest, abdomen, and pelvis.   She denies signs of infection such as sore throat, sinus drainage, cough, or urinary symptoms.  She denies fevers or recurrent chills. She denies pain. She denies nausea, vomiting, chest pain, dyspnea or cough. Her appetite is good and her weight has increased 3 pounds over last 3 weeks .  REVIEW OF SYSTEMS:  Review of Systems  Constitutional: Negative.  Negative for appetite change, chills, diaphoresis, fatigue, fever and unexpected weight change.  HENT:  Negative.  Negative for hearing loss, lump/mass, mouth sores, nosebleeds, sore throat, tinnitus, trouble swallowing and voice change.   Eyes: Negative.  Negative for eye problems and icterus.  Respiratory: Negative.  Negative for chest tightness, cough, hemoptysis, shortness of breath and wheezing.   Cardiovascular: Negative.  Negative for chest pain, leg swelling and palpitations.  Gastrointestinal: Negative.  Negative for abdominal distention, abdominal pain, blood in stool, constipation, diarrhea, nausea, rectal pain and vomiting.       Acid reflux  Endocrine: Negative.   Genitourinary: Negative.  Negative for bladder incontinence, difficulty urinating, dyspareunia, dysuria, frequency, hematuria, menstrual problem, nocturia, pelvic pain, vaginal bleeding and vaginal discharge.   Musculoskeletal:  Positive for back pain (10/10). Negative for arthralgias, flank pain, gait problem, myalgias, neck pain and neck stiffness.       Sciatica pain in her right hip/leg  Skin: Negative.  Negative for itching, rash and wound.  Neurological:  Positive for numbness (fingers/feet). Negative for dizziness, extremity weakness, gait problem, headaches, light-headedness, seizures and speech difficulty.  Hematological: Negative.   Negative for adenopathy. Does not bruise/bleed easily.  Psychiatric/Behavioral:  Negative for confusion, decreased concentration, depression, sleep disturbance and suicidal ideas. The patient is nervous/anxious.     VITALS:  Blood pressure 125/76, pulse (!) 57, temperature 98.1 F (36.7 C), temperature source Oral, resp. rate 18, height 5' (1.524 m), weight 161 lb 6.4 oz (73.2 kg), SpO2 99%.  Wt Readings from Last 3 Encounters:  07/21/23 161 lb 6.4 oz (73.2 kg)  07/01/23 158 lb 11.2 oz (72 kg)  05/19/23 168 lb (76.2 kg)    Body mass index is 31.52 kg/m.  Performance status (ECOG): 0 - Asymptomatic  PHYSICAL EXAM:  Physical Exam Vitals and nursing note reviewed.  Constitutional:      General: She is not in acute distress.    Appearance: Normal appearance. She is normal weight. She is not ill-appearing, toxic-appearing or diaphoretic.  HENT:     Head: Normocephalic and atraumatic.     Right Ear: Tympanic membrane, ear canal and external ear normal. There is no impacted cerumen.     Left Ear: Tympanic membrane, ear canal and external ear normal. There is no impacted cerumen.     Nose: Nose normal. No congestion or rhinorrhea.  Mouth/Throat:     Mouth: Mucous membranes are moist.     Pharynx: Oropharynx is clear. No oropharyngeal exudate or posterior oropharyngeal erythema.  Eyes:     General: No scleral icterus.       Right eye: No discharge.        Left eye: No discharge.     Extraocular Movements: Extraocular movements intact.     Conjunctiva/sclera: Conjunctivae normal.     Pupils: Pupils are equal, round, and reactive to light.  Neck:     Vascular: No carotid bruit.  Cardiovascular:     Rate and Rhythm: Regular rhythm. Bradycardia present.     Pulses: Normal pulses.     Heart sounds: Normal heart sounds. No murmur heard.    No friction rub. No gallop.  Pulmonary:     Effort: Pulmonary effort is normal. No respiratory distress.     Breath sounds: Normal breath sounds.  No stridor. No wheezing, rhonchi or rales.  Chest:     Chest wall: No tenderness.  Abdominal:     General: Bowel sounds are normal. There is no distension.     Palpations: Abdomen is soft. There is no hepatomegaly, splenomegaly or mass.     Tenderness: There is no abdominal tenderness. There is no right CVA tenderness, left CVA tenderness, guarding or rebound.     Hernia: No hernia is present.  Musculoskeletal:        General: No swelling, tenderness, deformity or signs of injury. Normal range of motion.     Cervical back: Normal range of motion and neck supple. No rigidity or tenderness.     Right lower leg: No edema.     Left lower leg: No edema.  Lymphadenopathy:     Cervical: No cervical adenopathy.  Skin:    General: Skin is warm and dry.     Coloration: Skin is not jaundiced or pale.     Findings: No bruising, erythema, lesion or rash.  Neurological:     General: No focal deficit present.     Mental Status: She is alert and oriented to person, place, and time. Mental status is at baseline.     Cranial Nerves: No cranial nerve deficit.     Sensory: No sensory deficit.     Motor: No weakness.     Coordination: Coordination normal.     Gait: Gait normal.     Deep Tendon Reflexes: Reflexes normal.  Psychiatric:        Mood and Affect: Mood normal.        Behavior: Behavior normal.        Thought Content: Thought content normal.        Judgment: Judgment normal.     LABS:      Latest Ref Rng & Units 06/28/2023    9:06 AM 12/31/2022   12:00 AM 12/28/2022   12:00 AM  CBC  WBC 4.0 - 10.5 K/uL 7.9  5.6  6.0      Hemoglobin 12.0 - 15.0 g/dL 86.8  87.3  87.3      Hematocrit 36.0 - 46.0 % 38.2  38.0  38      Platelets 150 - 400 K/uL 274  152  149         This result is from an external source.      Latest Ref Rng & Units 06/29/2023    9:03 AM 06/28/2023    9:06 AM 06/06/2023    8:33 AM  CMP  Glucose 70 -  99 mg/dL  888    BUN 8 - 23 mg/dL  12    Creatinine 9.55 - 1.00  mg/dL  8.29    Sodium 864 - 854 mmol/L  137    Potassium 3.5 - 5.1 mmol/L  3.8    Chloride 98 - 111 mmol/L  103    CO2 22 - 32 mmol/L  23    Calcium  8.9 - 10.3 mg/dL  9.7    Total Protein 6.5 - 8.1 g/dL  6.3    Total Bilirubin 0.0 - 1.2 mg/dL  0.5    Alkaline Phos 38 - 126 U/L  168    AST 0 - 40 IU/L 19  23  20    ALT 0 - 32 IU/L 12  13  11     Lab Results  Component Value Date   CEA1 8.3 (H) 09/22/2022   CEA 4.80 06/28/2023   /  CEA  Date Value Ref Range Status  09/22/2022 8.3 (H) 0.0 - 4.7 ng/mL Final    Comment:    (NOTE)                             Nonsmokers          <3.9                             Smokers             <5.6 [employer] Electrochemiluminescence Immunoassay (ECLIA) Values obtained with different assay methods or kits cannot be used interchangeably.  Results cannot be interpreted as absolute evidence of the presence or absence of malignant disease. Performed At: [facility] [address] [place], [place] [id number] Jennette Shorter MD Ey:[id number]    CEA ([facility])  Date Value Ref Range Status  06/28/2023 4.80 0.00 - 5.00 ng/mL Final    Comment:    (NOTE) This test was performed using [employer]'s paramagnetic chemiluminescent immunoassay. Values obtained from different assay methods cannot be used interchangeably. Please note that up to 8% of patients who smoke may see values 5.1-10.0 ng/ml and 1% of patients who smoke may see CEA levels >10.0 ng/ml. Performed at [facility], [address], [place], [place] [address]    STUDIES:  Exam: 07/19/2023 CT Chest with Contrast Impression: Lingular bandlike opacity likely representing subsegmental atelectasis, surrounding centrilobular tree-in-bud micro nodules suggest small airways disease (small airways inflammatory infectious process). Pulmonary micro nodules measuring up to 3mm as above. Per Fleischner Society guidelines in a low risk patient, no routine imaging  follow-up is recommended. In a high-risk patient, follow-up CT may be considered in 12 months.  9mm right hepatic hypodensity suboptimally characterized on this examination. Consider further evaluation with dedicated hepatic ultrasound. One or more dose reduction techniques were used.    HISTORY:   Allergies:  Allergies  Allergen Reactions   Penicillins Rash    Has patient had a PCN reaction causing immediate rash, facial/tongue/throat swelling, SOB or lightheadedness with hypotension: Yes Has patient had a PCN reaction causing severe rash involving mucus membranes or skin necrosis: No Has patient had a PCN reaction that required hospitalization: No Has patient had a PCN reaction occurring within the last 10 years: No If all of the above answers are NO, then may proceed with Cephalosporin use.    Codeine Other (See Comments)    Dizziness and lethargy   Aciphex  [Rabeprazole ] Rash  Guaifenesin & Derivatives Rash   Latex Rash    Usually from adhesive tapes   Other Rash    Tape cannot be on the skin for an extended period of time   Pseudoephedrine Rash   Sulfa Antibiotics Rash   Sulfasalazine Rash   Tape Rash    Cannot be on the skin for an extended period of time    Current Medications: Current Outpatient Medications  Medication Sig Dispense Refill   aspirin  EC 81 MG tablet Take 81 mg by mouth daily.     B Complex-Folic Acid (B COMPLEX-VITAMIN B12 PO) Take 1 tablet by mouth daily. Unknown strength     CALCIUM  PO Take 2 tablets by mouth daily. Unknown strength     Cholecalciferol (VITAMIN D3) 2000 units TABS Take 2,000 Units by mouth daily.     CINNAMON PO Take 1 tablet by mouth daily. Unknown strength     dexlansoprazole  (DEXILANT ) 60 MG capsule Take 1 capsule (60 mg total) by mouth daily. Please call [phone] to schedule an office visit for more refills 30 capsule 3   DULoxetine  (CYMBALTA ) 60 MG capsule Take 60 mg by mouth daily.  1   famotidine  (PEPCID ) 20 MG tablet  TAKE 1 TABLET BY MOUTH AT BEDTIME 90 tablet 0   furosemide  (LASIX ) 20 MG tablet Take 1 tablet (20 mg total) by mouth daily. 90 tablet 2   gabapentin (NEURONTIN) 800 MG tablet Take 800 mg by mouth 3 (three) times daily.     meloxicam (MOBIC) 15 MG tablet Take 15 mg by mouth daily.     metoprolol  succinate (TOPROL  XL) 50 MG 24 hr tablet Take 1 tablet (50 mg total) by mouth 2 (two) times daily. Take with or immediately following a meal. 180 tablet 3   Multiple Vitamins-Minerals (CENTRUM SILVER 50+WOMEN) TABS Take 1 tablet by mouth daily. Unknown strength     nitroGLYCERIN  (NITROSTAT ) 0.4 MG SL tablet Place 1 tablet (0.4 mg total) under the tongue every 5 (five) minutes x 3 doses as needed for chest pain. 25 tablet 2   ofloxacin (OCUFLOX) 0.3 % ophthalmic solution Place 1 drop into the left eye 4 (four) times daily.     prednisoLONE acetate (PRED FORTE) 1 % ophthalmic suspension Place 1 drop into the left eye in the morning, at noon, in the evening, and at bedtime.     RABEprazole  (ACIPHEX ) 20 MG tablet Take 20 mg by mouth daily.     ranolazine  (RANEXA ) 500 MG 12 hr tablet Take 1 tablet (500 mg total) by mouth 2 (two) times daily. 180 tablet 3   rosuvastatin  (CRESTOR ) 40 MG tablet Take 1 tablet (40 mg total) by mouth daily. 90 tablet 3   Current Facility-Administered Medications  Medication Dose Route Frequency Provider Last Rate Last Admin   0.9 %  sodium chloride  infusion  500 mL Intravenous Once Charlanne Groom, MD       0.9 %  sodium chloride  infusion  500 mL Intravenous Once Charlanne Groom, MD       ASSESSMENT & PLAN:  Assessment:   1. History of stage IIIB colon cancer diagnosed in December 2012.  The patient remains without obvious evidence of recurrence.  Colonoscopy in December 2021 was unremarkable and she will be due repeat examination in 2026.   2. Mild elevation of the CEA of uncertain etiology.  CT imaging in February and November 2022 did not reveal any evidence of malignancy.  CEA was  in the normal range in April 2023  but then went back up again a little in September and October.  However, it increased to 8.3 in April, 2024, but back down to 5.2 in July, 2024 and now down to 4.8 in January, 2025. I will simply repeat it again in 6 months and see her in 1 year.    3. Moderate to large hiatal hernia. Dr. Charlanne performed an EGD in October, 2023 with findings of a 6 cm hiatal hernia and diffuse inflammation. CT in July, 2024 revealed a large hiatal hernia with the gastric body fundus in an intra thoracic position.  4. Nodularity in the lingula noted in April, 2024.  A repeat CT was done on December 28, 2022 which showed slightly diminished size but increased consolidated appearance of heterogenous air space opacity and nodularity and the posterior lingula abutting the fissure. This measured 2.9cm and was previously 3.2cm in maximum diameter. This is most likely a non-specific infectious/inflammatory process. We will plan to monitor periodically and pursue further evaluation if this worsens or she becomes symptomatic. Repeat CT scan 07/19/2023 revealed the lingular bandlike opacity likely represents subsegmental atelectasis and surrounding centrilobular tree-in-bud micro nodules suggest small airways disease. Pulmonary micro nodules measuring up to 3mm as above and a 9mm right hepatic hypodensity suboptimally characterized on this examination. No routine imaging was recommended for her pulmonary micro nodules. I informed her that this scan is stable to improved compared to the last and that this looks more like sequelae from her asthma.  5. 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 Marlyce.  6. Congestive heart failure. CT scan in July, 2024 revealed cardiomegaly and coronary artery disease.   Plan:   Repeat CT chest done on 07/19/2023 revealed the lingular bandlike opacity likely represents subsegmental atelectasis and surrounding  centrilobular tree-in-bud micro nodules suggest small airways disease. Pulmonary micro nodules measuring up to 3 mm as above and a 9 mm right hepatic hypodensity suboptimally characterized on this examination. No routine imaging was recommended for her pulmonary micro nodules. I informed her that this scan is stable to improved compared to the last and that this looks more like sequelae from her asthma. She does note having asthma, cough, and occasionally hoarseness. Patient complains of severe back pain rating a 10/10. She had a CMP done on 07/18/2023 which was normal other than a low total protein of 5.7. Her last total protein with us  was 6.3 and I encouraged her to eat more protein in her diet.  She will return in 6 months for labs. I will see her back in a year with CBC, CMP, CEA, and I will add CT chest, abdomen, and pelvis. The patient understands the plans discussed today and is in agreement with them.  She knows to contact our office if she develops concerns prior to her next appointment.  I provided 16 minutes of face-to-face time during this this encounter and > 50% was spent counseling as documented under my assessment and plan.  Megan VEAR Cornish, MD  [facility] DE[facility] OF [facility] [address] [place] [address] Dept: [phone] Dept Fax: [phone]   No orders of the defined types were placed in this encounter.  I,Jasmine M Lassiter,acting as a scribe for Megan VEAR Cornish, MD.,have documented all relevant documentation on the behalf of Megan VEAR Cornish, MD,as directed by  Megan VEAR Cornish, MD while in the presence of Megan VEAR Cornish, MD.

## 2023-07-21 ENCOUNTER — Inpatient Hospital Stay: Payer: Medicare HMO | Attending: Oncology | Admitting: Oncology

## 2023-07-21 ENCOUNTER — Other Ambulatory Visit: Payer: Self-pay | Admitting: Oncology

## 2023-07-21 VITALS — BP 125/76 | HR 57 | Temp 98.1°F | Resp 18 | Ht 60.0 in | Wt 161.4 lb

## 2023-07-21 DIAGNOSIS — C182 Malignant neoplasm of ascending colon: Secondary | ICD-10-CM | POA: Diagnosis not present

## 2023-07-21 DIAGNOSIS — R918 Other nonspecific abnormal finding of lung field: Secondary | ICD-10-CM | POA: Diagnosis not present

## 2023-07-21 DIAGNOSIS — R978 Other abnormal tumor markers: Secondary | ICD-10-CM

## 2023-07-21 DIAGNOSIS — Z85038 Personal history of other malignant neoplasm of large intestine: Secondary | ICD-10-CM | POA: Insufficient documentation

## 2023-07-23 ENCOUNTER — Other Ambulatory Visit: Payer: Self-pay | Admitting: Gastroenterology

## 2023-07-25 ENCOUNTER — Telehealth: Payer: Self-pay | Admitting: Cardiology

## 2023-07-25 NOTE — Telephone Encounter (Signed)
 Pt c/o medication issue:  1. Name of Medication: furosemide  (LASIX ) 20 MG tablet   2. How are you currently taking this medication (dosage and times per day)?    3. Are you having a reaction (difficulty breathing--STAT)? no  4. What is your medication issue? Patient took a double taste of medication. What  to see if she will be okay. Please advise

## 2023-07-26 NOTE — Telephone Encounter (Signed)
Spoke with pt who stated that she took extra Lasix over the weekend due to leg swelling. She is back to her regular dose and her swelling has resolved. Encouraged her to continue to eat well and drink plenty of fluids. She will call if her swelling worsens.

## 2023-07-26 NOTE — Telephone Encounter (Signed)
Pt is calling to check the status of this message

## 2023-07-27 ENCOUNTER — Encounter: Payer: Self-pay | Admitting: Oncology

## 2023-07-29 ENCOUNTER — Other Ambulatory Visit: Payer: Self-pay | Admitting: Gastroenterology

## 2023-08-01 ENCOUNTER — Other Ambulatory Visit: Payer: Self-pay | Admitting: Gastroenterology

## 2023-08-05 ENCOUNTER — Encounter: Payer: Self-pay | Admitting: Oncology

## 2023-08-10 DIAGNOSIS — Z981 Arthrodesis status: Secondary | ICD-10-CM | POA: Diagnosis not present

## 2023-08-10 DIAGNOSIS — M5115 Intervertebral disc disorders with radiculopathy, thoracolumbar region: Secondary | ICD-10-CM | POA: Diagnosis not present

## 2023-08-10 DIAGNOSIS — M4726 Other spondylosis with radiculopathy, lumbar region: Secondary | ICD-10-CM | POA: Diagnosis not present

## 2023-08-10 DIAGNOSIS — M5416 Radiculopathy, lumbar region: Secondary | ICD-10-CM | POA: Diagnosis not present

## 2023-08-10 DIAGNOSIS — M47816 Spondylosis without myelopathy or radiculopathy, lumbar region: Secondary | ICD-10-CM | POA: Diagnosis not present

## 2023-08-10 DIAGNOSIS — M4725 Other spondylosis with radiculopathy, thoracolumbar region: Secondary | ICD-10-CM | POA: Diagnosis not present

## 2023-08-15 DIAGNOSIS — M47812 Spondylosis without myelopathy or radiculopathy, cervical region: Secondary | ICD-10-CM | POA: Diagnosis not present

## 2023-08-15 DIAGNOSIS — G5623 Lesion of ulnar nerve, bilateral upper limbs: Secondary | ICD-10-CM | POA: Diagnosis not present

## 2023-08-15 DIAGNOSIS — Z9221 Personal history of antineoplastic chemotherapy: Secondary | ICD-10-CM | POA: Diagnosis not present

## 2023-08-23 DIAGNOSIS — G5620 Lesion of ulnar nerve, unspecified upper limb: Secondary | ICD-10-CM | POA: Diagnosis not present

## 2023-08-23 DIAGNOSIS — M47812 Spondylosis without myelopathy or radiculopathy, cervical region: Secondary | ICD-10-CM | POA: Diagnosis not present

## 2023-08-30 DIAGNOSIS — M5416 Radiculopathy, lumbar region: Secondary | ICD-10-CM | POA: Diagnosis not present

## 2023-08-31 DIAGNOSIS — M5117 Intervertebral disc disorders with radiculopathy, lumbosacral region: Secondary | ICD-10-CM | POA: Diagnosis not present

## 2023-08-31 DIAGNOSIS — M4326 Fusion of spine, lumbar region: Secondary | ICD-10-CM | POA: Diagnosis not present

## 2023-08-31 DIAGNOSIS — G894 Chronic pain syndrome: Secondary | ICD-10-CM | POA: Diagnosis not present

## 2023-08-31 DIAGNOSIS — M4316 Spondylolisthesis, lumbar region: Secondary | ICD-10-CM | POA: Diagnosis not present

## 2023-08-31 DIAGNOSIS — M47816 Spondylosis without myelopathy or radiculopathy, lumbar region: Secondary | ICD-10-CM | POA: Diagnosis not present

## 2023-09-06 ENCOUNTER — Encounter: Payer: Self-pay | Admitting: Gastroenterology

## 2023-09-06 ENCOUNTER — Ambulatory Visit: Payer: Medicare HMO | Admitting: Gastroenterology

## 2023-09-06 VITALS — BP 123/64 | HR 56 | Ht 60.0 in | Wt 159.5 lb

## 2023-09-06 DIAGNOSIS — K219 Gastro-esophageal reflux disease without esophagitis: Secondary | ICD-10-CM | POA: Diagnosis not present

## 2023-09-06 DIAGNOSIS — R1319 Other dysphagia: Secondary | ICD-10-CM

## 2023-09-06 DIAGNOSIS — K222 Esophageal obstruction: Secondary | ICD-10-CM

## 2023-09-06 DIAGNOSIS — K449 Diaphragmatic hernia without obstruction or gangrene: Secondary | ICD-10-CM | POA: Diagnosis not present

## 2023-09-06 DIAGNOSIS — R1310 Dysphagia, unspecified: Secondary | ICD-10-CM

## 2023-09-06 DIAGNOSIS — Z760 Encounter for issue of repeat prescription: Secondary | ICD-10-CM

## 2023-09-06 MED ORDER — FAMOTIDINE 20 MG PO TABS: 20.0000 mg | ORAL_TABLET | Freq: Every day | ORAL | 0 refills | Status: DC

## 2023-09-06 MED ORDER — DEXLANSOPRAZOLE 60 MG PO CPDR: 60.0000 mg | DELAYED_RELEASE_CAPSULE | Freq: Every day | ORAL | 3 refills | Status: DC

## 2023-09-06 NOTE — Patient Instructions (Addendum)
 You have been scheduled for an endoscopy. Please follow written instructions given to you at your visit today.  If you use inhalers (even only as needed), please bring them with you on the day of your procedure.  If you take any of the following medications, they will need to be adjusted prior to your procedure:   DO NOT TAKE 7 DAYS PRIOR TO TEST- Trulicity (dulaglutide) Ozempic, Wegovy (semaglutide) Mounjaro (tirzepatide) Bydureon Bcise (exanatide extended release)  DO NOT TAKE 1 DAY PRIOR TO YOUR TEST Rybelsus (semaglutide) Adlyxin (lixisenatide) Victoza (liraglutide) Byetta (exanatide) _______________________________________________________________________   Thank you for trusting me with your gastrointestinal care!   Deanna May, NP    _______________________________________________________  If your blood pressure at your visit was 140/90 or greater, please contact your primary care physician to follow up on this.  _______________________________________________________  If you are age 24 or older, your body mass index should be between 23-30. Your Body mass index is 31.15 kg/m. If this is out of the aforementioned range listed, please consider follow up with your Primary Care Provider.  If you are age 81 or younger, your body mass index should be between 19-25. Your Body mass index is 31.15 kg/m. If this is out of the aformentioned range listed, please consider follow up with your Primary Care Provider.   ________________________________________________________  The [facility] providers would like to encourage you to use [facility] to communicate with providers for non-urgent requests or questions.  Due to long hold times on the telephone, sending your provider a message by [facility] may be a faster and more efficient way to get a response.  Please allow 48 business hours for a response.  Please remember that this is for non-urgent requests.   _______________________________________________________

## 2023-09-06 NOTE — Progress Notes (Signed)
 Chief Complaint:GERD and med refill Primary GI Doctor:Dr. Chales Abrahams  HPI:  Patient is a 68 year old female patient with past medical history of hypertension, GERD with HH, anxiety, depression, who presents for GERD and med refill.  03/23/22 patient last seen in GI office in [facility] for EGD with dilatation for LPR/hoarseness and dysphagia. EGD with Dr. Chales Abrahams Impression:  - Moderate Schatzki ring. Dilated.  - Large hiatal hernia.  - Gastritis. Biopsied.  Past GI procedures:   EGD 12/02/2020 - Schatzki ring. Biopsied. Dilated with 54Fr - 8 cm hiatal hernia with Cameron erosions. Biopsied. - Normal examined duodenum.   Colonoscopy 05/14/2020, recall 05/2025 -Pancolonic diverticulosis. -S/P right hemicolectomy. -Few erosions and neo-TI ?Importance (bx- mild inflammation.  No granulomas. -Non-bleeding internal hemorrhoids. -The examination was otherwise normal to anastomosis. No recurrence. -Repeat in 5 years.   CT chest/Abdo/pelvis 04/22/2021 -No evidence of metastatic disease in chest, Abdo/pelvis -Large hiatal hernia -Mild sigmoid diverticulosis.   -EGD 05/26/2017 8 cm HH with small paraesophageal component, Schatzki ring s/p dil 52 Fr -Colonoscopy 04/09/2015 ([facility])-6 mm sessile polyp s/p polypectomy (Bx- TA), R hemi, mild pancolonic div. -CT chest Abdo/pelvis with p.o. and IV contrast 07/2020: No recurrence, moderate to large hiatal hernia, atherosclerosis.  Interval History    Patient has history of GERD and currently taking Dexilant 60 mg po daily and Pepcid 20mg  at bedtime, accompanied by her daughter. Patient presents with main complaint intermittent issues of esophageal dysphagia that started few months ago. Patient denies nausea, vomiting, or weight loss.   Patient taking baby aspirin 81 mg po daily.   Wt Readings from Last 3 Encounters:  09/06/23 159 lb 8 oz (72.3 kg)  07/21/23 161 lb 6.4 oz (73.2 kg)  07/01/23 158 lb 11.2 oz (72 kg)    Past Medical History:  Diagnosis  Date   Adjustment disorder    Anesthesia complication 01/09/2018   Overview:  "It takes me a while to wake up."  Formatting of this note might be different from the original. "It takes me a while to wake up."   Anxiety 01/09/2018   Anxiety disorder    Arthritis    hands, back   Ascending aortic aneurysm (HCC) 38 mm based on echocardiogram from 2021 05/29/2020   Atypical chest pain 12/19/2016   Borderline diabetes 01/09/2018   CAD (coronary artery disease)    Cancer (HCC) 01/09/2018   Overview:  colon  Formatting of this note might be different from the original. colon   Cataract    removed by surgery   Chest pain    Colon cancer (HCC) 2012   Depression    Depression 01/09/2018   Dysphonia    Dysthymic disorder    Fatty liver 01/09/2018   Fusion of lumbar spine 08/21/2018   Gastroesophageal reflux disease    Hiatal hernia with GERD 01/09/2018   History of kidney stones 01/09/2018   Hyperlipidemia    Insomnia    Kidney stones    removed by surgery   MRSA (methicillin resistant Staphylococcus aureus)    MRSA infection 01/09/2018   Overview:  states winter of 2016, treated at [facility] by [place] NP  Formatting of this note might be different from the original. states winter of 2016, treated at [facility] by Syble Creek NP   Neuropathy    Obesity    Palpitations 01/20/2017   Pneumonia    Pre-syncope    Primary hypertension 06/02/2022   Sinus bradycardia 02/10/2021   Sleep apnea  Varicose veins of bilateral lower extremities with other complications 05/05/2021    Past Surgical History:  Procedure Laterality Date   ABDOMINAL HYSTERECTOMY     BACK SURGERY     BLADDER SURGERY     CATARACT EXTRACTION, BILATERAL     COLON SURGERY  2012   Due to colon cancer stage 3   COLONOSCOPY  04/09/2015   Colonic polyp status post polypectomy. Mild pancolonic diverticulosis/ History of colon cancer status post right hemicolectomy. No evidence of recurrence     ESOPHAGOGASTRODUODENOSCOPY  05/26/2017   Schatzkis ring statust post esophageal dilatation. Hiatal hernia.    EYE SURGERY Left 01/26/2022   to remove floaters   PORT-A-CATH REMOVAL     removal kidney stones     TUBAL LIGATION     UPPER GASTROINTESTINAL ENDOSCOPY      Current Outpatient Medications  Medication Sig Dispense Refill   aspirin EC 81 MG tablet Take 81 mg by mouth daily.     B Complex-Folic Acid (B COMPLEX-VITAMIN B12 PO) Take 1 tablet by mouth daily. Unknown strength     CALCIUM PO Take 2 tablets by mouth daily. Unknown strength     Cholecalciferol (VITAMIN D3) 2000 units TABS Take 2,000 Units by mouth daily.     dexlansoprazole (DEXILANT) 60 MG capsule Take 1 capsule (60 mg total) by mouth daily. Please call [phone] to schedule an office visit for more refills 30 capsule 3   DULoxetine (CYMBALTA) 60 MG capsule Take 60 mg by mouth daily.  1   famotidine (PEPCID) 20 MG tablet Take 1 tablet (20 mg total) by mouth at bedtime. Please call [phone] to schedule an office visit for more refills 90 tablet 0   furosemide (LASIX) 20 MG tablet Take 1 tablet (20 mg total) by mouth daily. 90 tablet 2   gabapentin (NEURONTIN) 800 MG tablet Take 800 mg by mouth 3 (three) times daily.     meloxicam (MOBIC) 15 MG tablet Take 15 mg by mouth daily.     Multiple Vitamins-Minerals (CENTRUM SILVER 50+WOMEN) TABS Take 1 tablet by mouth daily. Unknown strength     nitroGLYCERIN (NITROSTAT) 0.4 MG SL tablet Place 1 tablet (0.4 mg total) under the tongue every 5 (five) minutes x 3 doses as needed for chest pain. 25 tablet 2   RABEprazole (ACIPHEX) 20 MG tablet Take 20 mg by mouth daily.     ranolazine (RANEXA) 500 MG 12 hr tablet Take 1 tablet (500 mg total) by mouth 2 (two) times daily. 180 tablet 3   rosuvastatin (CRESTOR) 40 MG tablet Take 1 tablet (40 mg total) by mouth daily. 90 tablet 3   Current Facility-Administered Medications  Medication Dose Route Frequency Provider Last Rate Last  Admin   0.9 %  sodium chloride infusion  500 mL Intravenous Once Lynann Bologna, MD       0.9 %  sodium chloride infusion  500 mL Intravenous Once Lynann Bologna, MD        Allergies as of 09/06/2023 - Review Complete 07/21/2023  Allergen Reaction Noted   Penicillins Rash 12/19/2016   Codeine Other (See Comments) 06/30/2012   Aciphex [rabeprazole] Rash 10/31/2020   Guaifenesin & derivatives Rash 12/19/2016   Latex Rash 12/19/2016   Other Rash 12/19/2016   Pseudoephedrine Rash 12/19/2016   Sulfa antibiotics Rash 12/19/2016   Sulfasalazine Rash 12/19/2016   Tape Rash 12/19/2016    Family History  Problem Relation Age of Onset   CAD Mother  Age of Onset 69   Hypertension Mother    Kidney disease Mother    Heart failure Father    COPD Father    CVA Brother    Colon cancer Neg Hx    Rectal cancer Neg Hx    Stomach cancer Neg Hx    Esophageal cancer Neg Hx     Review of Systems:    Constitutional: No weight loss, fever, chills, weakness or fatigue HEENT: Eyes: No change in vision               Ears, Nose, Throat:  No change in hearing or congestion Skin: No rash or itching Cardiovascular: No chest pain, chest pressure or palpitations   Respiratory: No SOB or cough Gastrointestinal: See HPI and otherwise negative Genitourinary: No dysuria or change in urinary frequency Neurological: No headache, dizziness or syncope Musculoskeletal: No new muscle or joint pain Hematologic: No bleeding or bruising Psychiatric: No history of depression or anxiety    Physical Exam:  Vital signs: BP 123/64   Pulse (!) 56   Ht 5' (1.524 m)   Wt 159 lb 8 oz (72.3 kg)   LMP  (LMP Unknown)   SpO2 96%   BMI 31.15 kg/m   Constitutional:   Pleasant  female appears to be in NAD, Well developed, Well nourished, alert and cooperative Throat: Oral cavity and pharynx without inflammation, swelling or lesion.  Respiratory: Respirations even and unlabored. Lungs clear to auscultation  bilaterally.   No wheezes, crackles, or rhonchi.  Cardiovascular: Normal S1, S2. Regular rate and rhythm. No peripheral edema, cyanosis or pallor.  Gastrointestinal:  Soft, nondistended, nontender. No rebound or guarding. Normal bowel sounds. No appreciable masses or hepatomegaly. Rectal:  Not performed.  Msk:  Symmetrical without gross deformities. Without edema, no deformity or joint abnormality.  Neurologic:  Alert and  oriented x4;  grossly normal neurologically.  Skin:   Dry and intact without significant lesions or rashes. Psychiatric: Oriented to person, place and time. Demonstrates good judgement and reason without abnormal affect or behaviors.  RELEVANT LABS AND IMAGING: CBC    Latest Ref Rng & Units 06/28/2023    9:06 AM 12/31/2022   12:00 AM 12/28/2022   12:00 AM  CBC  WBC 4.0 - 10.5 K/uL 7.9  5.6  6.0      Hemoglobin 12.0 - 15.0 g/dL 11.9  14.7  82.9      Hematocrit 36.0 - 46.0 % 38.2  38.0  38      Platelets 150 - 400 K/uL 274  152  149         This result is from an external source.     CMP     Latest Ref Rng & Units 06/29/2023    9:03 AM 06/28/2023    9:06 AM 06/06/2023    8:33 AM  CMP  Glucose 70 - 99 mg/dL  562    BUN 8 - 23 mg/dL  12    Creatinine 1.30 - 1.00 mg/dL  8.65    Sodium 784 - 696 mmol/L  137    Potassium 3.5 - 5.1 mmol/L  3.8    Chloride 98 - 111 mmol/L  103    CO2 22 - 32 mmol/L  23    Calcium 8.9 - 10.3 mg/dL  9.7    Total Protein 6.5 - 8.1 g/dL  6.3    Total Bilirubin 0.0 - 1.2 mg/dL  0.5    Alkaline Phos 38 - 126 U/L  168  AST 0 - 40 IU/L 19  23  20    ALT 0 - 32 IU/L 12  13  11       Lab Results  Component Value Date   TSH 4.300 02/11/2023   01/31/23 echo- Left ventricular ejection fraction, by estimation, is 55 to 60%.   Assessment: Encounter Diagnoses  Name Primary?   Hiatal hernia with GERD Yes   Esophageal dysphagia    Esophageal stricture      68 year old female patient with GERD and large HH with esophageal dysphagia. H/O  Schatzki's ring s/p eso dil 2023. Schedule endoscopy with dilation today. She will discuss with Dr. Chales Abrahams after procedure if [facility] is large enough for surgical intervention. Refilled her medications for GERD.  Plan: - Continue Dexilant 60mg  po daily, refilled -Continue Pepcid 20mg  at bedtime, refilled -Schedule EGD with dilatation in [facility] with Dr. Chales Abrahams. The risks and benefits of EGD with possible biopsies and esophageal dilation were discussed with the patient who agrees to proceed. -recall colonoscopy 05/2025   Thank you for the courtesy of this consult. Please call me with any questions or concerns.   Jiovanni Heeter, FNP-C [facility] 09/06/2023, 10:23 AM  Cc: Hadley Pen, MD

## 2023-09-07 DIAGNOSIS — E669 Obesity, unspecified: Secondary | ICD-10-CM | POA: Diagnosis not present

## 2023-09-07 DIAGNOSIS — G5602 Carpal tunnel syndrome, left upper limb: Secondary | ICD-10-CM | POA: Diagnosis not present

## 2023-09-07 DIAGNOSIS — G5622 Lesion of ulnar nerve, left upper limb: Secondary | ICD-10-CM | POA: Diagnosis not present

## 2023-09-07 HISTORY — PX: ELBOW SURGERY: SHX618

## 2023-09-08 NOTE — Addendum Note (Signed)
 Addended by: Myrtie Hawk on: 09/08/2023 04:50 PM   Modules accepted: Orders

## 2023-09-15 ENCOUNTER — Encounter: Payer: Self-pay | Admitting: Oncology

## 2023-09-15 ENCOUNTER — Other Ambulatory Visit: Payer: Self-pay | Admitting: Rehabilitation

## 2023-09-15 DIAGNOSIS — M47816 Spondylosis without myelopathy or radiculopathy, lumbar region: Secondary | ICD-10-CM | POA: Diagnosis not present

## 2023-09-15 DIAGNOSIS — I82461 Acute embolism and thrombosis of right calf muscular vein: Secondary | ICD-10-CM

## 2023-09-15 DIAGNOSIS — M5416 Radiculopathy, lumbar region: Secondary | ICD-10-CM | POA: Diagnosis not present

## 2023-09-15 DIAGNOSIS — M79661 Pain in right lower leg: Secondary | ICD-10-CM | POA: Diagnosis not present

## 2023-09-15 DIAGNOSIS — M4326 Fusion of spine, lumbar region: Secondary | ICD-10-CM | POA: Diagnosis not present

## 2023-09-16 ENCOUNTER — Other Ambulatory Visit

## 2023-09-30 NOTE — Progress Notes (Signed)
 SUBJECTIVE   Patient ID: [name] is a 68 y.o. (DOB 17-Nov-1955) female with a past medical history of colon cancer, GERD, coronary artery disease, fusion of the lumbar spine, depression, hyperlipidemia, who presents to discuss vascular testing.  Chief Complaint  Patient presents with  . Follow-up   Ankle Swelling: Patient complains of right ankle swelling.  Onset of the symptoms was several weeks ago. Inciting event: none known. Current symptoms include ability to bear weight, but with some pain and swelling.  Patient recently underwent a lumbar MRI and evaluation by spine surgeon for lumbar radiculopathy.  She was sent to [facility] to rule out a DVT in the right lower extremity due to the fact that she had discoloration and numbness in the right lower extremity.  Right arm pain: Patient complains of right arm numbness for which has been present for several months. Patient reports she underwent an EMG/NCV which revealed ulnar nerve compression.  She is scheduled to have surgery by a local orthopedist but would like a second opinion before proceeding.     Results for orders placed or performed in visit on 07/18/23  Comprehensive Metabolic Panel   Collection Time: 07/18/23 12:23 PM  Result Value Ref Range   Sodium 139 136 - 145 mmol/L   Potassium 4.6 3.5 - 5.1 mmol/L   Chloride 108 (H) 98 - 107 mmol/L   CO2 26 21 - 31 mmol/L   Anion Gap 5 (L) 6 - 14 mmol/L   Glucose, Random 114 (H) 70 - 99 mg/dL   Blood Urea Nitrogen (BUN) 14 7 - 25 mg/dL   Creatinine 8.85 9.39 - 1.20 mg/dL   eGFR 52 (L) >40 fO/fpw/8.26f7   Albumin 3.8 3.5 - 5.7 g/dL   Total Protein 5.7 (L) 6.4 - 8.9 g/dL   Bilirubin, Total 0.6 0.3 - 1.0 mg/dL   Alkaline Phosphatase (ALP) 90 34 - 104 U/L   Aspartate Aminotransferase (AST) 16 13 - 39 U/L   Alanine Aminotransferase (ALT) 10 7 - 52 U/L   Calcium  9.7 8.6 - 10.3 mg/dL   BUN/Creatinine Ratio      OBJECTIVE   Allergies  Allergen Reactions  . Codeine  Fatigue  . Latex Rash  . Penicillin G Rash  . Rabeprazole  Rash  . Sulfamethoxazole Rash    Current Outpatient Medications  Medication Sig Dispense Refill  . aspirin  81 mg EC tablet Take 81 mg by mouth Once Daily.    . calcium  carbonate-vitamin D3 500 mg-3.125 mcg (125 unit) tab Take 2 tablets by mouth Once Daily.    . cholecalciferol (VITAMIN D3) 2,000 unit tablet Take 2,000 Units by mouth Once Daily.    . cinnamon 500 mg cap Take 1,000 mg by mouth Once Daily.    . cyproheptadine (PERIACTIN) 4 mg tablet Take 1 tablet by mouth nightly 90 tablet 1  . dexlansoprazole  (Dexilant ) 60 mg DR capsule Take 60 mg by mouth Once Daily.    . diclofenac (VOLTAREN) 75 mg EC tablet TAKE 1 TABLET BY MOUTH TWICE DAILY AFTER A MEAL    . dilTIAZem  (CARDIZEM  CD) 120 mg 24 hr capsule Take 120 mg by mouth Once Daily.    . DULoxetine  (CYMBALTA ) 60 mg capsule Take 1 capsule (60 mg total) by mouth daily. 90 capsule 3  . famotidine  (PEPCID ) 20 mg tablet Take 20 mg by mouth Once Daily.    . fluorometholone (FML LIQUIFILM) 0.1 % drps ophthalmic suspension     . furosemide  (LASIX ) 20 mg tablet Take 1  tablet (20 mg total) by mouth 2 (two) times a day. 180 tablet 3  . gabapentin (NEURONTIN) 300 mg capsule Take 1 capsule (300 mg total) by mouth in the morning and 1 capsule (300 mg total) at noon and 1 capsule (300 mg total) in the evening. 270 capsule 3  . hydrOXYzine pamoate (VISTARIL) 25 mg capsule TAKE 1 CAPSULE BY MOUTH NIGHTLY 30 capsule 0  . methocarbamoL (ROBAXIN) 500 mg tablet Take 1 tablet (500 mg total) by mouth every 8 (eight) hours. 40 tablet 0  . nitroglycerin  (Nitrostat ) 0.4 mg SL tablet Place 1 tablet (0.4 mg total) under the tongue every 5 (five) minutes as needed for chest pain. 25 tablet 3  . ranolazine  (RANEXA ) 500 mg 12 hr tablet Take 500 mg by mouth 2 (two) times a day.    . rosuvastatin  (CRESTOR ) 40 mg tablet Take 1 tablet by mouth daily.     No current facility-administered medications for this  visit.    Patient Active Problem List  Diagnosis  . Blurred vision, bilateral  . Pseudophakia of both eyes  . Dermatochalasis of eyelids of both eyes  . Conjunctivochalasis of both eyes  . Ametropia  . Retinal detachment, rhegmatogenous, left eye  . Vitreomacular traction syndrome of left eye  . Fusion of lumbar spine  . Overlapping malignant neoplasm of colon (HCC)  . Hoarseness  . Left leg pain  . Varicose veins of bilateral lower extremities with other complications  . Benign paroxysmal positional vertigo of right ear  . Vocal cord palsy  . Muscle tension dysphonia  . Laryngeal spasm  . Anxiety disorder  . Ascending aortic aneurysm (HCC)  . CAD (coronary artery disease)  . Depression  . Gastroesophageal reflux disease  . Hyperlipidemia  . Insomnia  . Primary hypertension  . Dysphagia  . Other chronic pain  . Cervical spondylosis  . Ulnar neuropathy of both upper extremities  . History of chemotherapy    Past Medical History:  Diagnosis Date  . Asthma, unspecified asthma severity, unspecified whether complicated, unspecified whether persistent (CMD)    Med Hx: Asthma;   . Bulging lumbar disc   . Colon cancer    (CMD) 05/26/2011  . Coronary artery disease   . Degenerative joint disease of spine   . Diverticulitis 05/26/2011  . GERD (gastroesophageal reflux disease) 06/14/2002  . Hypertension   . Kidney stone   . OSA (obstructive sleep apnea)    no CPAP  . Overlapping malignant neoplasm of colon    (CMD) 09/02/2020  . Plantar fasciitis       Past Surgical History:  Procedure Laterality Date  . ANTERIOR FUSION LUMBAR SPINE N/A 08/21/2018   Procedure: L4-5 ANTERIOR/POSTERIOR FUSION W/ OBLIQUE LATERAL INTERBODY FUSION AND INSTRUMENTATION AND ALLOGRAFT;  Surgeon: Royden Elsie Schneider, MD;  Location: [facility] MAIN OR;  Service: Orthopedics;  Laterality: N/A;  Supine, C-Arm, O-Arm, ProAxis, [employer], Justin, PACS on [facility]  . COLON SURGERY  2012   Procedure: COLON SURGERY;  colectomy  . CYSTOSCOPY N/A 10/03/2012   Procedure: CYSTOSCOPY;  Surgeon: Sula Latch, MD;  Location[facility] MAIN OR;  Service: Urology;  Laterality: N/A;  . HYSTERECTOMY      Procedure: HYSTERECTOMY  . INCISION & DRAINAGE SOFT TISSUE ABSCESS, SUBFASCIAL N/A 08/22/2018   Procedure: INCISION AND DRAINAGE SOFT TISSUE ABSCESS SUBFASCIAL;  Surgeon: Royden Elsie Schneider, MD;  Location: [facility] MAIN OR;  Service: Orthopedics;  Laterality: N/A;  . KIDNEY STONE SURGERY Right 10/03/2012   Procedure: URETEROSCOPIC STONE REMOVAL;  Surgeon: Sula Latch, MD;  Location: [facility] MA[facility] OR;  Service: Urology;  Laterality: Right;  . LITHOTRIPSY     Procedure: LITHOTRIPSY    Family History  Problem Relation Name Age of Onset  . Hypertension Father    . Kidney disease Father    . Cancer Mother    . Heart attack Mother    . Diabetes Mother    . Arthritis Mother      BP 128/82 (BP Location: Left arm, Patient Position: Sitting)   Pulse 59   Ht 1.524 m (5')   Wt 71.7 kg (158 lb)   SpO2 97%   BMI 30.86 kg/m   Most recent PHQ-2 results: Patient Health Questionnaire-2 Score: 0 (10/03/2023  8:59 AM)  Most recent PHQ-9 result: Patient Health Questionnaire-9 Score: 0 (10/03/2023  8:59 AM)  PHQ-9 Question # 9 Thoughts that you would be better off dead or hurting yourself in some way: Not at all (10/03/2023  8:59 AM)  Interpretation: PHQ-2 Interpretation: Negative (None-minimal Depression Severity) (10/03/2023  8:59 AM)  PHQ-9 Interpretation: Negative (None-minimal Depression Severity) (10/03/2023  8:59 AM)  Depression Plan: Normal/Negative Screening   Health Maintenance  Topic Date Due  . Bone Density Scan  Never done  . Hepatitis C Screening  Never done  . Hepatitis A Vaccines (1 of 2 - Risk 2-dose series) Never done  . Hepatitis B Vaccines (1 of 3 - Risk 3-dose series) Never done  . COVID-19 Vaccine (4 - 2024-25 season) 02/13/2023  . Depression Monitoring  02/07/2024  . Comprehensive Annual  Visit  02/16/2024  . Medicare Annual Wellness (AWV) Subsequent Visits  02/16/2024  . Diabetes Screening  07/17/2024  . Breast Cancer Screening (Mammogram)  03/28/2025  . DTaP/Tdap/Td Vaccines (3 - Td or Tdap) 02/10/2032  . Adult RSV (60+ Years or Pregnancy)  Completed  . Influenza Vaccine  Completed  . Pneumococcal Vaccine for Ages 50+  Completed  . ZOSTER VACCINE  Completed  . HIB Vaccines  Aged Out  . IPV Vaccines  Aged Out  . Meningococcal Conjugate (ACWY) Vaccine  Aged Out  . Rotavirus Vaccines  Aged Out  . HPV Vaccines  Aged Out  . Meningococcal B Vaccine  Aged Out  . Medicare Annual Wellness (AWV) Initial Visit  Discontinued  . Colorectal Cancer Screening  Discontinued  . Depression Screening  Discontinued    Review of Systems A complete ROS was performed with positive/negative pertinent findings listed in the HPI. All other systems are negative.  Review of Systems  Constitutional: Negative.   HENT: Negative.    Eyes: Negative.   Respiratory: Negative.    Cardiovascular: Negative.   Gastrointestinal: Negative.   Endocrine: Negative.   Genitourinary: Negative.   Musculoskeletal:  Positive for back pain and joint swelling.  Skin: Negative.   Allergic/Immunologic: Negative.   Neurological:  Positive for numbness.  Hematological: Negative.   Psychiatric/Behavioral: Negative.      Constitutional:  NAD.  Appears well-developed, well groomed and well nourished. Cardiovascular: Normal rate and rhythm.   Pulmonary/chest: Effort normal and breath sounds normal. Lungs clear to auscultation,  Musculo-Skeletal: Subcutaneous mass anterior to right lateral malleolus. Extremities: No edema bilaterally.  2+ pedal pulses Skin: Warm and dry. No rashes noted. Neuro:  Alert and oriented x 3, CN grossly intact.  Ulnar nerve issues right arm Psychiatric: Normal mood and affect. Behavior normal. Judgement and thought content normal.    ASSESSMENT/PLAN   1. Right ankle swelling  (Primary) Ultrasound of right ankle/lateral malleolus Further  recommendations pending ultrasound results.  2. 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 aspirin  81 mg EC tablet Take 81 mg by mouth Once Daily.    . calcium  carbonate-vitamin D3 500 mg-3.125 mcg (125 unit) tab Take 2 tablets by mouth Once Daily.    . cholecalciferol (VITAMIN D3) 2,000 unit tablet Take 2,000 Units by mouth Once Daily.    . cinnamon 500 mg cap Take 1,000 mg by mouth Once Daily.    . cyproheptadine (PERIACTIN) 4 mg tablet Take 1 tablet by mouth nightly 90 tablet 1  . dexlansoprazole  (Dexilant ) 60 mg DR capsule Take 60 mg by mouth Once Daily.    . diclofenac (VOLTAREN) 75 mg EC tablet TAKE 1 TABLET BY MOUTH TWICE DAILY AFTER A MEAL    . dilTIAZem  (CARDIZEM  CD) 120 mg 24 hr capsule Take 120 mg by mouth Once Daily.    . DULoxetine  (CYMBALTA ) 60 mg capsule Take 1 capsule (60 mg total) by mouth daily. 90 capsule 3  . famotidine  (PEPCID ) 20 mg tablet Take 20 mg by mouth Once Daily.    . fluorometholone (FML LIQUIFILM) 0.1 % drps ophthalmic suspension     . furosemide  (LASIX ) 20 mg tablet Take 1 tablet (20 mg total) by mouth 2 (two) times a day. 180 tablet 3  . gabapentin (NEURONTIN) 300 mg capsule Take 1 capsule (300 mg total) by mouth in the morning and 1 capsule (300 mg total) at noon and 1 capsule (300 mg total) in the evening. 270 capsule 3  . hydrOXYzine pamoate (VISTARIL) 25 mg capsule TAKE 1 CAPSULE BY MOUTH NIGHTLY 30 capsule 0  . methocarbamoL (ROBAXIN) 500 mg tablet Take 1 tablet (500 mg total) by mouth every 8 (eight) hours. 40 tablet 0  . nitroglycerin  (Nitrostat ) 0.4 mg SL tablet Place 1 tablet (0.4 mg total) under the tongue every 5 (five) minutes as needed for chest pain. 25 tablet 3  . ranolazine  (RANEXA ) 500 mg 12 hr tablet Take 500 mg by mouth 2 (two) times a day.     . rosuvastatin  (CRESTOR ) 40 mg tablet Take 1 tablet by mouth daily.     No facility-administered encounter medications on file as of 10/03/2023.   Risks, benefits, and alternatives of the medication(s) and treatment plan(s) were discussed, and she expressed understanding. Plan follow up as discussed or as needed. No barriers to treatment identified in this visit.     This document serves as a record of services personally performed by Lamar Simpers, MD.  It was created on their behalf by Leita Sharl Kilts, CMA, a trained medical scribe, and Certified Medical Assistant (CMA). During the course of documenting the history, physical exam and medical decision making, I was functioning as a Stage manager. The creation of this record is the provider's dictation and/or activities during the visit.  Electronically signed by Leita Sharl Souther, CMA 10/03/2023 9:18 AM   I agree the documentation is accurate and complete.  Electronically signed by: Lamar DELENA Simpers, MD 10/03/2023 9:23 AM

## 2023-10-03 DIAGNOSIS — M25471 Effusion, right ankle: Secondary | ICD-10-CM | POA: Diagnosis not present

## 2023-10-03 DIAGNOSIS — G5621 Lesion of ulnar nerve, right upper limb: Secondary | ICD-10-CM | POA: Diagnosis not present

## 2023-10-05 DIAGNOSIS — M25741 Osteophyte, right hand: Secondary | ICD-10-CM | POA: Diagnosis not present

## 2023-10-05 DIAGNOSIS — M25471 Effusion, right ankle: Secondary | ICD-10-CM | POA: Diagnosis not present

## 2023-10-05 DIAGNOSIS — R2241 Localized swelling, mass and lump, right lower limb: Secondary | ICD-10-CM | POA: Diagnosis not present

## 2023-10-06 NOTE — Telephone Encounter (Signed)
 Patient scheduled per directive and patient states she has not had any surgery on this arm

## 2023-10-10 ENCOUNTER — Encounter: Payer: Self-pay | Admitting: Gastroenterology

## 2023-10-10 ENCOUNTER — Ambulatory Visit (AMBULATORY_SURGERY_CENTER): Admitting: Gastroenterology

## 2023-10-10 VITALS — BP 138/68 | HR 52 | Temp 97.9°F | Resp 21 | Ht 60.0 in | Wt 159.0 lb

## 2023-10-10 DIAGNOSIS — K760 Fatty (change of) liver, not elsewhere classified: Secondary | ICD-10-CM | POA: Diagnosis not present

## 2023-10-10 DIAGNOSIS — Q399 Congenital malformation of esophagus, unspecified: Secondary | ICD-10-CM

## 2023-10-10 DIAGNOSIS — E669 Obesity, unspecified: Secondary | ICD-10-CM | POA: Diagnosis not present

## 2023-10-10 DIAGNOSIS — G4730 Sleep apnea, unspecified: Secondary | ICD-10-CM | POA: Diagnosis not present

## 2023-10-10 DIAGNOSIS — R1310 Dysphagia, unspecified: Secondary | ICD-10-CM | POA: Diagnosis not present

## 2023-10-10 DIAGNOSIS — F419 Anxiety disorder, unspecified: Secondary | ICD-10-CM | POA: Diagnosis not present

## 2023-10-10 DIAGNOSIS — K222 Esophageal obstruction: Secondary | ICD-10-CM

## 2023-10-10 DIAGNOSIS — K219 Gastro-esophageal reflux disease without esophagitis: Secondary | ICD-10-CM | POA: Diagnosis not present

## 2023-10-10 DIAGNOSIS — F32A Depression, unspecified: Secondary | ICD-10-CM | POA: Diagnosis not present

## 2023-10-10 DIAGNOSIS — K449 Diaphragmatic hernia without obstruction or gangrene: Secondary | ICD-10-CM

## 2023-10-10 MED ORDER — SODIUM CHLORIDE 0.9 % IV SOLN
500.0000 mL | Freq: Once | INTRAVENOUS | Status: DC
Start: 2023-10-10 — End: 2023-10-10

## 2023-10-10 NOTE — Progress Notes (Signed)
 Pt resting comfortably. VSS. Airway intact. SBAR complete to RN. All questions answered.

## 2023-10-10 NOTE — Progress Notes (Signed)
 Chief Complaint: FU  Referring Provider:  Melva Stabile, MD      ASSESSMENT AND PLAN;   #1. GERD with large HH with LPR/hoarsenss. Now with eso dysphagia. H/O Schatzki's ring s/p eso dil 2018.  #2.  H/O colon cancer (stage III, [id number]) s/p R hemicolectomy 2012.  H/O colonic polyps 03/2015.  Neg colon 05/2020, neg CT C/A/P Nov 2022.  Plan:  - EGD with eso dilatation. - Dexilant  60mg  po qd #90, 4 RF - Pepcid  20mg  po qhs. #90, 4 RF - Senna 4/day - Fleets ennema x 2. One tinight and one in AM - Miralax 17 g BID - Drink water. - Instructed patient to chew foods especially meats and breads well and eat slowly.  HPI:    [name] is a 68 y.o. female  For follow-up  S/P left eye Sx for floaters recently.  Still has eye shield on.  Also seen by ENT- neg eval except for ? VC nodule.  Awaiting biopsy.   Reflux is under good control with Dexilant  in a.m. and Pepcid  at bedtime.  She felt better after dilation.  Occasional dysphagia but not as bad.  She would like to have another dilation.  Also complains of constipation with no Bms x 2 weeks Took senna 2 previously with some relief. Complains of abdominal discomfort and bloating.  Most recently her CT chest/Abdo/pelvis November 2022 was unremarkable.  Her CEA level is down as compared to the previous level.  Her colonoscopy December 2021 did not show any recurrence.  No weight loss.  No melena or hematochezia.   Past GI procedures:  EGD 12/02/2020 - Schatzki ring. Biopsied. Dilated with 54Fr - 8 cm hiatal hernia with Cameron erosions. Biopsied. - Normal examined duodenum.  Colonoscopy 05/14/2020 -Pancolonic diverticulosis. -S/P right hemicolectomy. -Few erosions and neo-TI ?Importance (bx- mild inflammation.  No granulomas. -Non-bleeding internal hemorrhoids. -The examination was otherwise normal to anastomosis. No recurrence. -Repeat in 5 years.  CT chest/Abdo/pelvis 04/22/2021 -No evidence of  metastatic disease in chest, Abdo/pelvis -Large hiatal hernia -Mild sigmoid diverticulosis.  -EGD 05/26/2017 8 cm HH with small paraesophageal component, Schatzki ring s/p dil 52 Fr -Colonoscopy 04/09/2015 ([facility])-6 mm sessile polyp s/p polypectomy (Bx- TA), R hemi, mild pancolonic div. -CT chest Abdo/pelvis with p.o. and IV contrast 07/2020: No recurrence, moderate to large hiatal hernia, atherosclerosis. Past Medical History:  Diagnosis Date   Adjustment disorder    Anesthesia complication 01/09/2018   Overview:  "It takes me a while to wake up."  Formatting of this note might be different from the original. "It takes me a while to wake up."   Anxiety 01/09/2018   Anxiety disorder    Arthritis    hands, back   Ascending aortic aneurysm (HCC) 38 mm based on echocardiogram from 2021 05/29/2020   Atypical chest pain 12/19/2016   Borderline diabetes 01/09/2018   CAD (coronary artery disease)    Cancer (HCC) 01/09/2018   Overview:  colon  Formatting of this note might be different from the original. colon   Cataract    removed by surgery   Chest pain    Colon cancer (HCC) 2012   Depression    Depression 01/09/2018   Dysphonia    Dysthymic disorder    Fatty liver 01/09/2018   Fusion of lumbar spine 08/21/2018   Gastroesophageal reflux disease    Hiatal hernia with GERD 01/09/2018   History of kidney stones 01/09/2018   Hyperlipidemia    Insomnia  Kidney stones    removed by surgery   MRSA (methicillin resistant Staphylococcus aureus)    MRSA infection 01/09/2018   Overview:  states winter of 2016, treated at [facility] by Ruben Corolla NP  Formatting of this note might be different from the original. states winter of 2016, treated at [facility] by Ruben Corolla NP   Neuropathy    Obesity    Palpitations 01/20/2017   Pneumonia    Pre-syncope    Primary hypertension 06/02/2022   Sinus bradycardia 02/10/2021   Sleep apnea    Varicose veins of bilateral lower  extremities with other complications 05/05/2021    Past Surgical History:  Procedure Laterality Date   ABDOMINAL HYSTERECTOMY     BACK SURGERY     BLADDER SURGERY     CATARACT EXTRACTION, BILATERAL     COLON SURGERY  2012   Due to colon cancer stage 3   COLONOSCOPY  04/09/2015   Colonic polyp status post polypectomy. Mild pancolonic diverticulosis/ History of colon cancer status post right hemicolectomy. No evidence of recurrence    ELBOW SURGERY Left 09/07/2023   Due to pinched nerve   ESOPHAGOGASTRODUODENOSCOPY  05/26/2017   Schatzkis ring statust post esophageal dilatation. Hiatal hernia.    EYE SURGERY Left 01/26/2022   to remove floaters   PORT-A-CATH REMOVAL     removal kidney stones     TUBAL LIGATION     UPPER GASTROINTESTINAL ENDOSCOPY      Family History  Problem Relation Age of Onset   CAD Mother        Age of Onset 33   Hypertension Mother    Kidney disease Mother    Heart failure Father    COPD Father    CVA Brother    Colon cancer Neg Hx    Rectal cancer Neg Hx    Stomach cancer Neg Hx    Esophageal cancer Neg Hx     Social History   Tobacco Use   Smoking status: Never   Smokeless tobacco: Never  Vaping Use   Vaping status: Never Used  Substance Use Topics   Alcohol use: No   Drug use: No    Current Outpatient Medications  Medication Sig Dispense Refill   aspirin  EC 81 MG tablet Take 81 mg by mouth daily.     B Complex-Folic Acid (B COMPLEX-VITAMIN B12 PO) Take 1 tablet by mouth daily. Unknown strength     CALCIUM  PO Take 2 tablets by mouth daily. Unknown strength     Cholecalciferol (VITAMIN D3) 2000 units TABS Take 2,000 Units by mouth daily.     dexlansoprazole  (DEXILANT ) 60 MG capsule Take 1 capsule (60 mg total) by mouth daily. Please call [phone] to schedule an office visit for more refills 90 capsule 3   DULoxetine  (CYMBALTA ) 60 MG capsule Take 60 mg by mouth daily.  1   famotidine  (PEPCID ) 20 MG tablet Take 1 tablet (20 mg total)  by mouth at bedtime. Please call [phone] to schedule an office visit for more refills 90 tablet 0   furosemide  (LASIX ) 20 MG tablet Take 1 tablet (20 mg total) by mouth daily. 90 tablet 2   gabapentin (NEURONTIN) 800 MG tablet Take 800 mg by mouth 3 (three) times daily.     meloxicam (MOBIC) 15 MG tablet Take 15 mg by mouth daily.     metoprolol  succinate (TOPROL -XL) 50 MG 24 hr tablet Take 50 mg by mouth 2 (two) times daily.  Multiple Vitamins-Minerals (CENTRUM SILVER 50+WOMEN) TABS Take 1 tablet by mouth daily. Unknown strength     RABEprazole  (ACIPHEX ) 20 MG tablet Take 20 mg by mouth daily.     ranolazine  (RANEXA ) 500 MG 12 hr tablet Take 1 tablet (500 mg total) by mouth 2 (two) times daily. 180 tablet 3   nitroGLYCERIN  (NITROSTAT ) 0.4 MG SL tablet Place 1 tablet (0.4 mg total) under the tongue every 5 (five) minutes x 3 doses as needed for chest pain. 25 tablet 2   rosuvastatin  (CRESTOR ) 40 MG tablet Take 1 tablet (40 mg total) by mouth daily. 90 tablet 3   Current Facility-Administered Medications  Medication Dose Route Frequency Provider Last Rate Last Admin   0.9 %  sodium chloride  infusion  500 mL Intravenous Once Lajuan Pila, MD       0.9 %  sodium chloride  infusion  500 mL Intravenous Once Lajuan Pila, MD       0.9 %  sodium chloride  infusion  500 mL Intravenous Once Lajuan Pila, MD        Allergies  Allergen Reactions   Penicillins Rash    Has patient had a PCN reaction causing immediate rash, facial/tongue/throat swelling, SOB or lightheadedness with hypotension: Yes Has patient had a PCN reaction causing severe rash involving mucus membranes or skin necrosis: No Has patient had a PCN reaction that required hospitalization: No Has patient had a PCN reaction occurring within the last 10 years: No If all of the above answers are "NO", then may proceed with Cephalosporin use.    Codeine Other (See Comments)    Dizziness and lethargy   Aciphex  [Rabeprazole ] Rash    Guaifenesin & Derivatives Rash   Latex Rash    Usually from adhesive tapes   Other Rash    Tape cannot be on the skin for an extended period of time   Pseudoephedrine Rash   Sulfa Antibiotics Rash   Sulfasalazine Rash   Tape Rash    Cannot be on the skin for an extended period of time    Review of Systems:  neg     Physical Exam:    BP 128/73   Pulse (!) 52   Temp 97.9 F (36.6 C) (Temporal)   Resp 19   Ht 5' (1.524 m)   Wt 159 lb (72.1 kg)   LMP  (LMP Unknown)   SpO2 100%   BMI 31.05 kg/m  Filed Weights   10/10/23 0927  Weight: 159 lb (72.1 kg)  Gen: awake, alert, NAD HEENT: anicteric, no pallor CV: RRR, no mrg Pulm: CTA b/l Abd: soft, NT/ND, +BS throughout Ext: no c/c/e Neuro: nonfocal       Magnus Schuller, MD 10/10/2023, 10:14 AM  Cc: Melva Stabile, MD

## 2023-10-10 NOTE — Patient Instructions (Signed)
 Follow post dilation diet. Repeat upper as needed. Resume previous medications.  YOU HAD AN ENDOSCOPIC PROCEDURE TODAY AT THE [facility]:   Refer to the procedure report that was given to you for any specific questions about what was found during the examination.  If the procedure report does not answer your questions, please call your gastroenterologist to clarify.  If you requested that your care partner not be given the details of your procedure findings, then the procedure report has been included in a sealed envelope for you to review at your convenience later.  YOU SHOULD EXPECT: Some feelings of bloating in the abdomen. Passage of more gas than usual.  Walking can help get rid of the air that was put into your GI tract during the procedure and reduce the bloating. If you had a lower endoscopy (such as a colonoscopy or flexible sigmoidoscopy) you may notice spotting of blood in your stool or on the toilet paper. If you underwent a bowel prep for your procedure, you may not have a normal bowel movement for a few days.  Please Note:  You might notice some irritation and congestion in your nose or some drainage.  This is from the oxygen used during your procedure.  There is no need for concern and it should clear up in a day or so.  SYMPTOMS TO REPORT IMMEDIATELY:  Following upper endoscopy (EGD)  Vomiting of blood or coffee ground material  New chest pain or pain under the shoulder blades  Painful or persistently difficult swallowing  New shortness of breath  Fever of 100F or higher  Black, tarry-looking stools  For urgent or emergent issues, a gastroenterologist can be reached at any hour by calling [phone]. Do not use MyChart messaging for urgent concerns.    DIET:  We do recommend a small meal at first, but then you may proceed to your regular diet.  Drink plenty of fluids but you should avoid alcoholic beverages for 24 hours.  ACTIVITY:  You should plan to take  it easy for the rest of today and you should NOT DRIVE or use heavy machinery until tomorrow (because of the sedation medicines used during the test).    FOLLOW UP: Our staff will call the number listed on your records the next business day following your procedure.  We will call around 7:15- 8:00 am to check on you and address any questions or concerns that you may have regarding the information given to you following your procedure. If we do not reach you, we will leave a message.     If any biopsies were taken you will be contacted by phone or by letter within the next 1-3 weeks.  Please call us  at [phone] if you have not heard about the biopsies in 3 weeks.    SIGNATURES/CONFIDENTIALITY: You and/or your care partner have signed paperwork which will be entered into your electronic medical record.  These signatures attest to the fact that that the information above on your After Visit Summary has been reviewed and is understood.  Full responsibility of the confidentiality of this discharge information lies with you and/or your care-partner.

## 2023-10-10 NOTE — Progress Notes (Signed)
 Left elbow & left wrist surgery on 09/07/2023 since last office visit with [facility] due to a pinched nerve.

## 2023-10-10 NOTE — Op Note (Signed)
 [facility] Patient Name: [name] Procedure Date: 10/10/2023 10:13 AM MRN: [id number] Endoscopist: Lajuan Pila , MD, [id number] Age: 68 Referring MD:  Date of Birth: 05-08-1956 Gender: Female Account #: [id number] Procedure:                Upper GI endoscopy Indications:              Dysphagia Medicines:                Monitored Anesthesia Care Procedure:                Pre-Anesthesia Assessment:                           - Prior to the procedure, a History and Physical                            was performed, and patient medications and                            allergies were reviewed. The patient's tolerance of                            previous anesthesia was also reviewed. The risks                            and benefits of the procedure and the sedation                            options and risks were discussed with the patient.                            All questions were answered, and informed consent                            was obtained. Prior Anticoagulants: The patient has                            taken no anticoagulant or antiplatelet agents. ASA                            Grade Assessment: II - A patient with mild systemic                            disease. After reviewing the risks and benefits,                            the patient was deemed in satisfactory condition to                            undergo the procedure.                           After obtaining informed consent, the endoscope was  passed under direct vision. Throughout the                            procedure, the patient's blood pressure, pulse, and                            oxygen saturations were monitored continuously. The                            Olympus Scope [id number] was introduced through the                            mouth, and advanced to the second part of duodenum.                            The upper GI endoscopy was accomplished  without                            difficulty. The patient tolerated the procedure                            well. Scope In: 10:20:06 AM Scope Out: 10:23:42 AM Total Procedure Duration: 0 hours 3 minutes 36 seconds  Findings:                 The lower third of the esophagus was significantly                            tortuous and foreshortened.                           A moderate Schatzki ring was found at the                            gastroesophageal junction at 32 cm luminal diameter                            of 12 mm. The scope was withdrawn. Dilation was                            performed with a Maloney dilator with mild                            resistance at 52 Fr and 54 Fr.                           A 6 cm hiatal hernia was present.                           The exam was otherwise without abnormality. Complications:            No immediate complications. Estimated Blood Loss:     Estimated blood loss: none. Impression:               - Moderate Schatzki ring. Dilated.                           -  6 cm hiatal hernia.                           - The examination was otherwise normal.                           - No specimens collected. Recommendation:           - Patient has a contact number available for                            emergencies. The signs and symptoms of potential                            delayed complications were discussed with the                            patient. Return to normal activities tomorrow.                            Written discharge instructions were provided to the                            patient.                           - Post dil diet.                           - Continue present medications.                           - Rpt EGD as needed.                           - The findings and recommendations were discussed                            with the patient's daughter [name]. Lajuan Pila, MD 10/10/2023 10:30:54 AM This report has  been signed electronically.

## 2023-10-10 NOTE — Progress Notes (Signed)
 Called to room to assist during endoscopic procedure.  Patient ID and intended procedure confirmed with present staff. Received instructions for my participation in the procedure from the performing physician.

## 2023-10-11 ENCOUNTER — Telehealth: Payer: Self-pay

## 2023-10-11 DIAGNOSIS — M5416 Radiculopathy, lumbar region: Secondary | ICD-10-CM | POA: Diagnosis not present

## 2023-10-11 NOTE — Telephone Encounter (Signed)
  Follow up Call-     10/10/2023    9:23 AM 03/23/2022   10:35 AM  Call back number  Post procedure Call Back phone  # [phone]  Permission to leave phone message Yes Yes     Patient questions:  Do you have a fever, pain , or abdominal swelling? No. Pain Score  0 *  Have you tolerated food without any problems? Yes.    Have you been able to return to your normal activities? Yes.    Do you have any questions about your discharge instructions: Diet   No. Medications  No. Follow up visit  No.  Do you have questions or concerns about your Care? No.  Actions: * If pain score is 4 or above: No action needed, pain <4.

## 2023-10-17 DIAGNOSIS — D1720 Benign lipomatous neoplasm of skin and subcutaneous tissue of unspecified limb: Secondary | ICD-10-CM | POA: Diagnosis not present

## 2023-11-01 DIAGNOSIS — M5416 Radiculopathy, lumbar region: Secondary | ICD-10-CM | POA: Insufficient documentation

## 2023-11-03 DIAGNOSIS — G5601 Carpal tunnel syndrome, right upper limb: Secondary | ICD-10-CM | POA: Diagnosis not present

## 2023-11-03 DIAGNOSIS — G5621 Lesion of ulnar nerve, right upper limb: Secondary | ICD-10-CM | POA: Insufficient documentation

## 2023-11-13 HISTORY — PX: ELBOW SURGERY: SHX618

## 2023-11-17 DIAGNOSIS — I25119 Atherosclerotic heart disease of native coronary artery with unspecified angina pectoris: Secondary | ICD-10-CM | POA: Diagnosis not present

## 2023-11-17 DIAGNOSIS — M1990 Unspecified osteoarthritis, unspecified site: Secondary | ICD-10-CM | POA: Diagnosis not present

## 2023-11-17 DIAGNOSIS — Z8249 Family history of ischemic heart disease and other diseases of the circulatory system: Secondary | ICD-10-CM | POA: Diagnosis not present

## 2023-11-17 DIAGNOSIS — E785 Hyperlipidemia, unspecified: Secondary | ICD-10-CM | POA: Diagnosis not present

## 2023-11-17 DIAGNOSIS — G629 Polyneuropathy, unspecified: Secondary | ICD-10-CM | POA: Diagnosis not present

## 2023-11-17 DIAGNOSIS — Z008 Encounter for other general examination: Secondary | ICD-10-CM | POA: Diagnosis not present

## 2023-11-17 DIAGNOSIS — I509 Heart failure, unspecified: Secondary | ICD-10-CM | POA: Diagnosis not present

## 2023-11-17 DIAGNOSIS — I700 Atherosclerosis of aorta: Secondary | ICD-10-CM | POA: Diagnosis not present

## 2023-11-17 DIAGNOSIS — K219 Gastro-esophageal reflux disease without esophagitis: Secondary | ICD-10-CM | POA: Diagnosis not present

## 2023-11-17 DIAGNOSIS — E669 Obesity, unspecified: Secondary | ICD-10-CM | POA: Diagnosis not present

## 2023-11-17 DIAGNOSIS — I130 Hypertensive heart and chronic kidney disease with heart failure and stage 1 through stage 4 chronic kidney disease, or unspecified chronic kidney disease: Secondary | ICD-10-CM | POA: Diagnosis not present

## 2023-11-17 DIAGNOSIS — G4733 Obstructive sleep apnea (adult) (pediatric): Secondary | ICD-10-CM | POA: Diagnosis not present

## 2023-11-17 DIAGNOSIS — K5900 Constipation, unspecified: Secondary | ICD-10-CM | POA: Diagnosis not present

## 2023-11-18 ENCOUNTER — Ambulatory Visit: Attending: Cardiology | Admitting: Cardiology

## 2023-11-18 VITALS — BP 100/68 | HR 59 | Ht 60.0 in | Wt 158.4 lb

## 2023-11-18 DIAGNOSIS — E782 Mixed hyperlipidemia: Secondary | ICD-10-CM | POA: Diagnosis not present

## 2023-11-18 DIAGNOSIS — R7303 Prediabetes: Secondary | ICD-10-CM

## 2023-11-18 DIAGNOSIS — I10 Essential (primary) hypertension: Secondary | ICD-10-CM | POA: Diagnosis not present

## 2023-11-18 DIAGNOSIS — I2510 Atherosclerotic heart disease of native coronary artery without angina pectoris: Secondary | ICD-10-CM | POA: Diagnosis not present

## 2023-11-18 DIAGNOSIS — R0789 Other chest pain: Secondary | ICD-10-CM

## 2023-11-18 NOTE — Progress Notes (Signed)
 Cardiology Office Note:    Date:  11/18/2023   ID:  [name], DOB 1955-07-06, MRN [id number]  PCP:  Melva Stabile, MD  Cardiologist:  Ralene Burger, MD    Referring MD: Melva Stabile, MD   Chief Complaint  Patient presents with   Follow-up    History of Present Illness:    [name] is a 68 y.o. female past medical history significant for coronary artery disease she did have cardiac catheterization 2000 show 40% stenosis then repeated cardiac catheterization 2019 showed no hemodynamically significant obstruction.  Additional problem include essential hypertension dyslipidemia malnourishment of the aorta but measuring only 39 mm. Comes today to months for follow-up.  Overall doing great.  She denies have any chest pain tightness squeezing pressure burning chest no palpitations dizziness swelling of lower extremities.  Recently she had some elbow surgery for entrapment of the nerve did okay and not completely satisfied with his results though.  Past Medical History:  Diagnosis Date   Adjustment disorder    Anesthesia complication 01/09/2018   Overview:  "It takes me a while to wake up."  Formatting of this note might be different from the original. "It takes me a while to wake up."   Anxiety 01/09/2018   Anxiety disorder    Arthritis    hands, back   Ascending aortic aneurysm (HCC) 38 mm based on echocardiogram from 2021 05/29/2020   Atypical chest pain 12/19/2016   Borderline diabetes 01/09/2018   CAD (coronary artery disease)    Cancer (HCC) 01/09/2018   Overview:  colon  Formatting of this note might be different from the original. colon   Cataract    removed by surgery   Chest pain    Colon cancer (HCC) 2012   Depression    Depression 01/09/2018   Dysphonia    Dysthymic disorder    Fatty liver 01/09/2018   Fusion of lumbar spine 08/21/2018   Gastroesophageal reflux disease    Hiatal hernia with GERD 01/09/2018   History of  kidney stones 01/09/2018   Hyperlipidemia    Insomnia    Kidney stones    removed by surgery   MRSA (methicillin resistant Staphylococcus aureus)    MRSA infection 01/09/2018   Overview:  states winter of 2016, treated at [facility] by Ruben Corolla NP  Formatting of this note might be different from the original. states winter of 2016, treated at [facility] by Ruben Corolla NP   Neuropathy    Obesity    Palpitations 01/20/2017   Pneumonia    Pre-syncope    Primary hypertension 06/02/2022   Sinus bradycardia 02/10/2021   Sleep apnea    Varicose veins of bilateral lower extremities with other complications 05/05/2021    Past Surgical History:  Procedure Laterality Date   ABDOMINAL HYSTERECTOMY     BACK SURGERY     BLADDER SURGERY     CATARACT EXTRACTION, BILATERAL     COLON SURGERY  2012   Due to colon cancer stage 3   COLONOSCOPY  04/09/2015   Colonic polyp status post polypectomy. Mild pancolonic diverticulosis/ History of colon cancer status post right hemicolectomy. No evidence of recurrence    ELBOW SURGERY Left 09/07/2023   Due to pinched nerve   ESOPHAGOGASTRODUODENOSCOPY  05/26/2017   Schatzkis ring statust post esophageal dilatation. Hiatal hernia.    EYE SURGERY Left 01/26/2022   to remove floaters   PORT-A-CATH REMOVAL     removal  kidney stones     TUBAL LIGATION     UPPER GASTROINTESTINAL ENDOSCOPY      Current Medications: Current Meds  Medication Sig   aspirin  EC 81 MG tablet Take 81 mg by mouth daily.   B Complex-Folic Acid (B COMPLEX-VITAMIN B12 PO) Take 1 tablet by mouth daily. Unknown strength   CALCIUM  PO Take 2 tablets by mouth daily. Unknown strength   Cholecalciferol (VITAMIN D3) 2000 units TABS Take 2,000 Units by mouth daily.   dexlansoprazole  (DEXILANT ) 60 MG capsule Take 1 capsule (60 mg total) by mouth daily. Please call [phone] to schedule an office visit for more refills   DULoxetine  (CYMBALTA ) 60 MG capsule Take 60 mg by  mouth daily.   famotidine  (PEPCID ) 20 MG tablet Take 1 tablet (20 mg total) by mouth at bedtime. Please call [phone] to schedule an office visit for more refills   furosemide  (LASIX ) 20 MG tablet Take 1 tablet (20 mg total) by mouth daily.   meloxicam (MOBIC) 15 MG tablet Take 15 mg by mouth daily.   metoprolol  succinate (TOPROL -XL) 50 MG 24 hr tablet Take 50 mg by mouth 2 (two) times daily.   Multiple Vitamins-Minerals (CENTRUM SILVER 50+WOMEN) TABS Take 1 tablet by mouth daily. Unknown strength   nitroGLYCERIN  (NITROSTAT ) 0.4 MG SL tablet Place 1 tablet (0.4 mg total) under the tongue every 5 (five) minutes x 3 doses as needed for chest pain.   pregabalin (LYRICA) 25 MG capsule Take 25-50 mg by mouth at bedtime.   RABEprazole  (ACIPHEX ) 20 MG tablet Take 20 mg by mouth daily.   ranolazine  (RANEXA ) 500 MG 12 hr tablet Take 1 tablet (500 mg total) by mouth 2 (two) times daily.   rosuvastatin  (CRESTOR ) 40 MG tablet Take 1 tablet (40 mg total) by mouth daily.   [DISCONTINUED] gabapentin (NEURONTIN) 800 MG tablet Take 800 mg by mouth 3 (three) times daily.     Allergies:   Penicillins, Codeine, Sulfa antibiotics, Wound dressing adhesive, Wound dressings, Aciphex  [rabeprazole ], Guaifenesin & derivatives, Latex, Other, Pseudoephedrine, Sulfasalazine, and Tape   Social History   Socioeconomic History   Marital status: Widowed    Spouse name: Not on file   Number of children: Not on file   Years of education: Not on file   Highest education level: Not on file  Occupational History   Not on file  Tobacco Use   Smoking status: Never   Smokeless tobacco: Never  Vaping Use   Vaping status: Never Used  Substance and Sexual Activity   Alcohol use: No   Drug use: No   Sexual activity: Not Currently    Birth control/protection: Post-menopausal  Other Topics Concern   Not on file  Social History Narrative   Not on file   Social Drivers of Health   Financial Resource Strain: Low Risk   (11/01/2023)   [facility]   Overall Financial Resource Strain (CARDIA)    Difficulty of Paying Living Expenses: Not hard at all  Food Insecurity: Low Risk  (11/03/2023)   Received from [facility]   Hunger Vital Sign    Worried About Running Out of Food in the Last Year: Never true    Ran Out of Food in the Last Year: Never true  Transportation Needs: No Transportation Needs (11/03/2023)   Received from [facility]    In the past 12 months, has lack of reliable transportation kept you from medical appointments, meetings, work or from getting things  needed for daily living? : No  Physical Activity: Not on file  Stress: Not on file  Social Connections: Not on file     Family History: The patient's family history includes CAD in her mother; COPD in her father; CVA in her brother; Heart failure in her father; Hypertension in her mother; Kidney disease in her mother. There is no history of Colon cancer, Rectal cancer, Stomach cancer, or Esophageal cancer. ROS:   Please see the history of present illness.    All 14 point review of systems negative except as described per history of present illness  EKGs/Labs/Other Studies Reviewed:         Recent Labs: 02/11/2023: TSH 4.300 05/17/2023: NT-Pro BNP 712 06/28/2023: BUN 12; Creatinine 1.70; Hemoglobin 13.1; Platelet Count 274; Potassium 3.8; Sodium 137 06/29/2023: ALT 12  Recent Lipid Panel    Component Value Date/Time   CHOL 88 (L) 06/29/2023 0903   TRIG 107 06/29/2023 0903   HDL 37 (L) 06/29/2023 0903   CHOLHDL 2.4 06/29/2023 0903   LDLCALC 31 06/29/2023 0903    Physical Exam:    VS:  BP 100/68 (BP Location: Left Arm, Patient Position: Sitting)   Pulse (!) 59   Ht 5' (1.524 m)   Wt 158 lb 6.4 oz (71.8 kg)   LMP  (LMP Unknown)   SpO2 92%   BMI 30.94 kg/m     Wt Readings from Last 3 Encounters:  11/18/23 158 lb 6.4 oz (71.8 kg)  10/10/23 159 lb (72.1 kg)  09/06/23 159 lb 8 oz (72.3 kg)      GEN:  Well nourished, well developed in no acute distress HEENT: Normal NECK: No JVD; No carotid bruits LYMPHATICS: No lymphadenopathy CARDIAC: RRR, no murmurs, no rubs, no gallops RESPIRATORY:  Clear to auscultation without rales, wheezing or rhonchi  ABDOMEN: Soft, non-tender, non-distended MUSCULOSKELETAL:  No edema; No deformity  SKIN: Warm and dry LOWER EXTREMITIES: no swelling NEUROLOGIC:  Alert and oriented x 3 PSYCHIATRIC:  Normal affect   ASSESSMENT:    1. Coronary artery disease involving native coronary artery of native heart without angina pectoris   2. Primary hypertension   3. Atypical chest pain   4. Borderline diabetes   5. Mixed hyperlipidemia    PLAN:    In order of problems listed above:  Coronary disease stable nonobstructive on last assessment asymptomatic last stress test reviewed from last year was negative.  Continue risk modification with antiplatelet therapy and cholesterol control. Essential hypertension blood pressure well-controlled continue present management. Dyslipidemia I did review K PN LDL 31 HDL 37 good management continue present management. Borderline diabetes I do not have any recent hemoglobin A1c being followed by internal medicine team.   Medication Adjustments/Labs and Tests Ordered: Current medicines are reviewed at length with the patient today.  Concerns regarding medicines are outlined above.  No orders of the defined types were placed in this encounter.  Medication changes: No orders of the defined types were placed in this encounter.   Signed, Manfred Seed, MD, [facility] 11/18/2023 8:49 AM    [facility]

## 2023-11-18 NOTE — Patient Instructions (Signed)
 Medication Instructions:  Your physician recommends that you continue on your current medications as directed. Please refer to the Current Medication list given to you today.  *If you need a refill on your cardiac medications before your next appointment, please call your pharmacy*  Lab Work: None If you have labs (blood work) drawn today and your tests are completely normal, you will receive your results only by: MyChart Message (if you have MyChart) OR A paper copy in the mail If you have any lab test that is abnormal or we need to change your treatment, we will call you to review the results.  Testing/Procedures: None  Follow-Up: At [facility], you and your health needs are our priority.  As part of our continuing mission to provide you with exceptional heart care, our providers are all part of one team.  This team includes your primary Cardiologist (physician) and Advanced Practice Providers or APPs (Physician Assistants and Nurse Practitioners) who all work together to provide you with the care you need, when you need it.  Your next appointment:   1 year(s)  Provider:   Ralene Burger, MD    We recommend signing up for the patient portal called "MyChart".  Sign up information is provided on this After Visit Summary.  MyChart is used to connect with patients for Virtual Visits (Telemedicine).  Patients are able to view lab/test results, encounter notes, upcoming appointments, etc.  Non-urgent messages can be sent to your provider as well.   To learn more about what you can do with MyChart, go to [email or web address].   Other Instructions None

## 2023-11-22 DIAGNOSIS — M5416 Radiculopathy, lumbar region: Secondary | ICD-10-CM | POA: Diagnosis not present

## 2023-12-06 DIAGNOSIS — G5621 Lesion of ulnar nerve, right upper limb: Secondary | ICD-10-CM | POA: Diagnosis not present

## 2023-12-06 DIAGNOSIS — G5601 Carpal tunnel syndrome, right upper limb: Secondary | ICD-10-CM | POA: Diagnosis not present

## 2023-12-06 NOTE — Progress Notes (Signed)
 O2 SATS DECREASED ON ROOM AIR, NASAL CANNULA RESTARTED AT 2L

## 2023-12-21 DIAGNOSIS — Z1330 Encounter for screening examination for mental health and behavioral disorders, unspecified: Secondary | ICD-10-CM | POA: Diagnosis not present

## 2023-12-21 DIAGNOSIS — M5416 Radiculopathy, lumbar region: Secondary | ICD-10-CM | POA: Diagnosis not present

## 2023-12-21 DIAGNOSIS — M4326 Fusion of spine, lumbar region: Secondary | ICD-10-CM | POA: Diagnosis not present

## 2023-12-29 ENCOUNTER — Inpatient Hospital Stay: Payer: Medicare HMO

## 2023-12-29 ENCOUNTER — Inpatient Hospital Stay

## 2024-01-09 ENCOUNTER — Telehealth: Payer: Self-pay | Admitting: Oncology

## 2024-01-09 NOTE — Telephone Encounter (Signed)
 Patient has been scheduled. Aware of appt date and time.    Scheduling Message Entered by CORNELIUS REUSING H on 01/03/2024 at  1:30 PM Priority: High <No visit type provided>  Department: [facility]  Provider:  Scheduling Notes:  She was supposed to have labs 7/17 - [facility].  I just need it drawn before her Aug appt.pls

## 2024-01-10 ENCOUNTER — Inpatient Hospital Stay: Attending: Oncology

## 2024-01-10 DIAGNOSIS — R970 Elevated carcinoembryonic antigen [CEA]: Secondary | ICD-10-CM | POA: Diagnosis not present

## 2024-01-10 DIAGNOSIS — Z85038 Personal history of other malignant neoplasm of large intestine: Secondary | ICD-10-CM | POA: Diagnosis not present

## 2024-01-10 DIAGNOSIS — R978 Other abnormal tumor markers: Secondary | ICD-10-CM

## 2024-01-10 LAB — CEA (ACCESS): CEA (CHCC): 6.4 ng/mL — ABNORMAL HIGH (ref 0.00–5.00)

## 2024-01-12 ENCOUNTER — Other Ambulatory Visit: Payer: Self-pay | Admitting: Cardiology

## 2024-01-16 NOTE — Telephone Encounter (Signed)
 For review-Historical Provider, are we managing??

## 2024-01-16 NOTE — Progress Notes (Signed)
 [name] MRN: [id number] DOB: 02-01-1956 (age: 68 y.o.)  HPI: Is in clinic today for follow up s/p right carpal and cubital tunnel release 12/06/2023 by Dr. Delene.  States that she is doing well with resolution of her preop numbness and tingling.  Continues to have increased sensitivity and soreness over the incisions at the hand and elbow.  Denies fever, chills, night sweats.  Physical Exam: On exam this is a well-nourished well-developed individual in no acute distress.  They are alert and oriented x3.  They are cooperative with the exam.  Right upper Extremity Exam: Overlying skin is warm dry and intact. Incisions are completely healed.  With increased sensitivity particularly over the carpal tunnel incision.  Capillary refill is brisk and skin turgor is appropriate. SILT in the ulnar, radial, and median distributions.  Can make a composite fist when compared bilaterally.  Fingers are warm and pink.   Impression/plan: 1.  Status post right carpal and cubital tunnel releases: Healing well with some increased scar sensitivity particularly at the carpal tunnel scar - Discussed referral to OT for scar desensitization.  However, she not interested in this at this time.  Call and let us  know if she changes her mind and would like a referral - Activity as tolerated  Follow-up in 6 weeks with Dr. Delene for reassessment.   Isaiah Anton, PA-C

## 2024-01-17 NOTE — Progress Notes (Signed)
 [facility]  [address] [place],  [place]  [address] [phone]  Clinic Day:01/18/24  Referring physician: Silver Lamar LABOR, MD  CHIEF COMPLAINT:  CC:  History of stage IIIB colon cancer with mild elevation of the CEA  Current Treatment:   Observation  HISTORY OF PRESENT ILLNESS:  [name] is a 68 y.o. 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  She was treated with surgical resection.  Pathology revealed a 3.6 centimeter, grade 2, adenocarcinoma with 3 of 45 nodes positive for metastasis.  She received adjuvant chemotherapy with FOLFOX for 5 cycles.  Oxaliplatin was then discontinued because of severe cytopenias.  She continued 5 fluorouracil and leucovorin for 4 more cycles, but then developed a severe allergic reaction to the 5-FU.  She was then placed on Xeloda for 6 weeks to complete a full 6 months of adjuvant chemotherapy.  She also had difficulty tolerating the Xeloda.  She has not had evidence of recurrence.  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  The patient is a nonsmoker.  She had a CT abdomen/pelvis in June 2016 for abdominal pain, but this was negative other than a small hiatal hernia.  MRI of the lumbar spine in July 2019 revealed left sided neural impingement at L1-2 with multilevel spondylosis. She had spinal surgery with Dr. Marlyce.  Her CEA was mildly elevated in April 2021 at 5.5. CT chest, abdomen and pelvis at that time did not reveal any evidence of malignancy. The CEA was 5 in July, then 5.2 in October. Colonoscopy with Dr. Charlanne in December 2021 was unremarkable. Repeat in 5 years was recommended.   The CEA went up to 7.5 in January 2022, and went down to 7.0 in February. CT chest, abdomen and pelvis from February did not  reveal any evidence of local recurrence of metastatic disease. There was a stable moderate to large hiatal hernia noted.  The CEA was down to 5.6 in May, then 5.2 in August. She states she has 40% coronary artery blockage, for which she is on aspirin  81 mg daily, metoprolol  25 mg twice daily, nitroglycerin  prn, and rosuvastatin  20 mg daily through Dr. Krasowski. CT chest, abdomen and pelvis from November 9th of 2022 revealed no findings to suggest metastatic disease. CEA went back up to 5.9 in November of 2022. CEA was 4.5 in April 2023, the best it's been, and first normal reading since January of 2022. She has never been a smoker.  INTERVAL HISTORY:  [name] is here for routine follow up for her remote history of stage IIIB colon cancer with mild chronic elevation of the CEA. Patient states that she feels ok but complains of constant left ear irritation, fatigue, occasional nausea, and right upper quadrant pain. I will prescribe Cortisporin Otic for her ears as she appears to have otitis externa. 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 She has a mammogram scheduled in September and is up to date on colonoscopies. Her CEA on 01/10/2024 was elevated at 6.40 up from 4.80 in January, 2025. Her  CEA has been elevated in the past, in April. 2024 it was elevated at 8.3 but dropped to 5.2 in July, 2024. Her last CT chest was done in February, 2025 but she has not had any recent scanning of her abdomen. I will schedule a CT chest, abdomen, and pelvis as she has had recent abdomen tenderness and I will call her with the results.  I will draw a CBC and CMP today.  If all is well on her CT scan she can return in 6 months with CBC, CMP, and CEA as scheduled. I will cancel the CT scans ordered for January  if the current ones are clear.  She denies fever, chills, night sweats, or other signs of infection. She denies cardiorespiratory and gastrointestinal issues. Her appetite is good and Her weight has been stable.     REVIEW OF SYSTEMS:  Review of Systems  Constitutional:  Positive for fatigue. Negative for appetite change, chills, diaphoresis, fever and unexpected weight change.  HENT:  Negative.  Negative for hearing loss, lump/mass, mouth sores, nosebleeds, sore throat, tinnitus, trouble swallowing and voice change.        Left ear irritation  Eyes: Negative.  Negative for eye problems and icterus.  Respiratory: Negative.  Negative for chest tightness, cough, hemoptysis, shortness of breath and wheezing.   Cardiovascular: Negative.  Negative for chest pain, leg swelling and palpitations.  Gastrointestinal:  Positive for abdominal pain (right upper quadrant). Negative for abdominal distention, blood in stool, constipation, diarrhea, nausea, rectal pain and vomiting.       Acid reflux  Endocrine: Negative.   Genitourinary: Negative.  Negative for bladder incontinence, difficulty urinating, dyspareunia, dysuria, frequency, hematuria, menstrual problem, nocturia, pelvic pain, vaginal bleeding and vaginal discharge.   Musculoskeletal:  Positive for back pain (10/10). Negative for arthralgias, flank pain, gait problem, myalgias, neck pain and neck stiffness.       Sciatica pain in her right hip/leg  Skin: Negative.  Negative for itching, rash and wound.  Neurological:  Positive for numbness (fingers/feet). Negative for dizziness, extremity weakness, gait problem, headaches, light-headedness, seizures and speech difficulty.  Hematological: Negative.  Negative for adenopathy. Does not bruise/bleed easily.  Psychiatric/Behavioral:  Negative for confusion, decreased concentration, depression, sleep disturbance and suicidal ideas. The patient is nervous/anxious.     VITALS:  Blood pressure 135/74,  pulse (!) 51, temperature 97.8 F (36.6 C), temperature source Oral, resp. rate 16, height 5' (1.524 m), weight 161 lb (73 kg), SpO2 99%.  Wt Readings from Last 3 Encounters:  01/18/24 161 lb (73 kg)  11/18/23 158 lb 6.4 oz (71.8 kg)  10/10/23 159 lb (72.1 kg)    Body mass index is 31.44 kg/m.  Performance status (ECOG): 1 - Symptomatic but completely ambulatory  PHYSICAL EXAM:  Physical Exam Vitals and nursing note reviewed.  Constitutional:      General: She is not in acute distress.    Appearance: Normal appearance. She is normal weight. She is not ill-appearing, toxic-appearing or diaphoretic.  HENT:     Head: Normocephalic and atraumatic.     Right Ear: Tympanic membrane and external ear normal. There is no impacted cerumen.     Left Ear: Tympanic membrane and external ear normal. There is no impacted cerumen.     Ears:     Comments: Both ear drums have a good light reflex but there is some irritation and erythema of the ear canal L>R.     Nose: Nose normal. No congestion or rhinorrhea.  Mouth/Throat:     Mouth: Mucous membranes are moist.     Pharynx: Oropharynx is clear. No oropharyngeal exudate or posterior oropharyngeal erythema.  Eyes:     General: No scleral icterus.       Right eye: No discharge.        Left eye: No discharge.     Extraocular Movements: Extraocular movements intact.     Conjunctiva/sclera: Conjunctivae normal.     Pupils: Pupils are equal, round, and reactive to light.  Neck:     Vascular: No carotid bruit.  Cardiovascular:     Rate and Rhythm: Regular rhythm. Bradycardia present.     Pulses: Normal pulses.     Heart sounds: Normal heart sounds. No murmur heard.    No friction rub. No gallop.  Pulmonary:     Effort: Pulmonary effort is normal. No respiratory distress.     Breath sounds: Normal breath sounds. No stridor. No wheezing, rhonchi or rales.  Chest:     Chest wall: No tenderness.  Abdominal:     General: Bowel sounds are normal.  There is no distension.     Palpations: Abdomen is soft. There is no hepatomegaly, splenomegaly or mass.     Tenderness: There is abdominal tenderness in the right upper quadrant. There is no right CVA tenderness, left CVA tenderness, guarding or rebound.     Hernia: No hernia is present.  Musculoskeletal:        General: No swelling, tenderness, deformity or signs of injury. Normal range of motion.     Cervical back: Normal range of motion and neck supple. No rigidity or tenderness.     Right lower leg: No edema.     Left lower leg: No edema.  Lymphadenopathy:     Cervical: No cervical adenopathy.  Skin:    General: Skin is warm and dry.     Coloration: Skin is not jaundiced or pale.     Findings: No bruising, erythema, lesion or rash.  Neurological:     General: No focal deficit present.     Mental Status: She is alert and oriented to person, place, and time. Mental status is at baseline.     Cranial Nerves: No cranial nerve deficit.     Sensory: No sensory deficit.     Motor: No weakness.     Coordination: Coordination normal.     Gait: Gait normal.     Deep Tendon Reflexes: Reflexes normal.  Psychiatric:        Mood and Affect: Mood normal.        Behavior: Behavior normal.        Thought Content: Thought content normal.        Judgment: Judgment normal.    LABS:      Latest Ref Rng & Units 01/18/2024   10:02 AM 06/28/2023    9:06 AM 12/31/2022   12:00 AM  CBC  WBC 4.0 - 10.5 K/uL 6.4  7.9  5.6   Hemoglobin 12.0 - 15.0 g/dL 87.9  86.8  87.3   Hematocrit 36.0 - 46.0 % 36.4  38.2  38.0   Platelets 150 - 400 K/uL 142  274  152       Latest Ref Rng & Units 01/18/2024   10:02 AM 06/29/2023    9:03 AM 06/28/2023    9:06 AM  CMP  Glucose 70 - 99 mg/dL 94   888   BUN 8 - 23 mg/dL 19   12   Creatinine  0.44 - 1.00 mg/dL 8.80   8.29   Sodium 864 - 145 mmol/L 140   137   Potassium 3.5 - 5.1 mmol/L 4.0   3.8   Chloride 98 - 111 mmol/L 106   103   CO2 22 - 32 mmol/L 23   23    Calcium  8.9 - 10.3 mg/dL 9.3   9.7   Total Protein 6.5 - 8.1 g/dL 6.3   6.3   Total Bilirubin 0.0 - 1.2 mg/dL 0.5   0.5   Alkaline Phos 38 - 126 U/L 76   168   AST 15 - 41 U/L 24  19  23    ALT 0 - 44 U/L 15  12  13     Lab Results  Component Value Date   CEA1 8.3 (H) 09/22/2022   CEA 6.40 (H) 01/10/2024   /  CEA  Date Value Ref Range Status  09/22/2022 8.3 (H) 0.0 - 4.7 ng/mL Final    Comment:    (NOTE)                             Nonsmokers          <3.9                             Smokers             <5.6 [employer] Electrochemiluminescence Immunoassay (ECLIA) Values obtained with different assay methods or kits cannot be used interchangeably.  Results cannot be interpreted as absolute evidence of the presence or absence of malignant disease. Performed At: [facility] [address] [place], [place] [id number] Jennette Shorter MD Ey:[phone]    CEA ([facility])  Date Value Ref Range Status  01/10/2024 6.40 (H) 0.00 - 5.00 ng/mL Final    Comment:    (NOTE) This test was performed using [employer] Coulter's paramagnetic chemiluminescent immunoassay. Values obtained from different assay methods cannot be used interchangeably. Please note that up to 8% of patients who smoke may see values 5.1-10.0 ng/ml and 1% of patients who smoke may see CEA levels >10.0 ng/ml. Performed at [facility], [address], [place], [place] [address]    STUDIES:  Exam: 07/19/2023 CT Chest with Contrast Impression: Lingular bandlike opacity likely representing subsegmental atelectasis, surrounding centrilobular tree-in-bud micro nodules suggest small airways disease (small airways inflammatory infectious process). Pulmonary micro nodules measuring up to 3mm as above. Per Fleischner Society guidelines in a low risk patient, no routine imaging follow-up is recommended. In a high-risk patient, follow-up CT may be considered in 12 months.  9mm right hepatic  hypodensity suboptimally characterized on this examination. Consider further evaluation with dedicated hepatic ultrasound. One or more dose reduction techniques were used.    HISTORY:   Allergies:  Allergies  Allergen Reactions   Penicillins Rash    Has patient had a PCN reaction causing immediate rash, facial/tongue/throat swelling, SOB or lightheadedness with hypotension: Yes Has patient had a PCN reaction causing severe rash involving mucus membranes or skin necrosis: No Has patient had a PCN reaction that required hospitalization: No Has patient had a PCN reaction occurring within the last 10 years: No If all of the above answers are NO, then may proceed with Cephalosporin use.    Codeine Other (See Comments)    Dizziness and lethargy   Sulfa Antibiotics Rash and Dermatitis   Wound Dressing Adhesive Dermatitis  Cannot be on the skin for an extended period of time   Wound Dressings Dermatitis    Cannot be on the skin for an extended period of time   Aciphex  [Rabeprazole ] Rash   Guaifenesin & Derivatives Rash   Latex Rash    Usually from adhesive tapes   Other Rash    Tape cannot be on the skin for an extended period of time   Pseudoephedrine Rash   Sulfasalazine Rash   Tape Rash and Dermatitis    Cannot be on the skin for an extended period of time    Current Medications: Current Outpatient Medications  Medication Sig Dispense Refill   diclofenac (VOLTAREN) 75 MG EC tablet      diltiazem  (CARDIZEM  CD) 120 MG 24 hr capsule Take 120 mg by mouth.     rosuvastatin  (CRESTOR ) 40 MG tablet Take 1 tablet (40 mg total) by mouth daily. 90 tablet 3   aspirin  EC 81 MG tablet Take 81 mg by mouth daily.     B Complex-Folic Acid (B COMPLEX-VITAMIN B12 PO) Take 1 tablet by mouth daily. Unknown strength     CALCIUM  PO Take 2 tablets by mouth daily. Unknown strength     Cholecalciferol (VITAMIN D3) 2000 units TABS Take 2,000 Units by mouth daily.     dexlansoprazole  (DEXILANT ) 60 MG  capsule Take 1 capsule (60 mg total) by mouth daily. Please call [phone] to schedule an office visit for more refills 90 capsule 3   DULoxetine  (CYMBALTA ) 60 MG capsule Take 60 mg by mouth daily.  1   famotidine  (PEPCID ) 20 MG tablet TAKE 1 TABLET BY MOUTH AT BEDTIME . APPOINTMENT REQUIRED FOR FUTURE REFILLS. CALL [phone] TO SCHEDULE 90 tablet 0   furosemide  (LASIX ) 20 MG tablet Take 1 tablet (20 mg total) by mouth daily. 90 tablet 2   meloxicam (MOBIC) 15 MG tablet Take 15 mg by mouth daily.     metoprolol  succinate (TOPROL -XL) 50 MG 24 hr tablet Take 1 tablet (50 mg total) by mouth 2 (two) times daily. 180 tablet 3   Multiple Vitamins-Minerals (CENTRUM SILVER 50+WOMEN) TABS Take 1 tablet by mouth daily. Unknown strength     neomycin -polymyxin-hydrocortisone (CORTISPORIN) OTIC solution Place 3 drops into the left ear 4 (four) times daily. 10 mL 0   nitroGLYCERIN  (NITROSTAT ) 0.4 MG SL tablet Place 1 tablet (0.4 mg total) under the tongue every 5 (five) minutes x 3 doses as needed for chest pain. 25 tablet 2   pregabalin (LYRICA) 25 MG capsule Take 25-50 mg by mouth at bedtime.     RABEprazole  (ACIPHEX ) 20 MG tablet Take 20 mg by mouth daily.     ranolazine  (RANEXA ) 500 MG 12 hr tablet Take 1 tablet (500 mg total) by mouth 2 (two) times daily. 180 tablet 3   No current facility-administered medications for this visit.   ASSESSMENT & PLAN:  Assessment:   1. History of stage IIIB colon cancer diagnosed in December 2012.  The patient remains without obvious evidence of recurrence.  Colonoscopy in December 2021 was unremarkable and she will be due repeat examination in 2026.   2. Mild elevation of the CEA of uncertain etiology.  CT imaging in February and November 2022 did not reveal any evidence of malignancy.  CEA was in the normal range in April 2023 but then went back up again a little in September and October.  However, it increased to 8.3 in April, 2024, but back down to 5.2 in July,  2024  and now down to 4.8 in January, 2025. Since it is back up at 6.4 today, we will schedule CT chest, abdomen, and pelvis, and cancel the ones ordered for January 2026 if all is clear.    3. Moderate to large hiatal hernia. Dr. Charlanne performed an EGD in October, 2023 with findings of a 6 cm hiatal hernia and diffuse inflammation. CT in July, 2024 revealed a large hiatal hernia with the gastric body fundus in an intra thoracic position.  4. Nodularity in the lingula noted in April, 2024.  A repeat CT was done on December 28, 2022 which showed slightly diminished size but increased consolidated appearance of heterogenous air space opacity and nodularity and the posterior lingula abutting the fissure. This measured 2.9cm and was previously 3.2cm in maximum diameter. This is most likely a non-specific infectious/inflammatory process. We will plan to monitor periodically and pursue further evaluation if this worsens or she becomes symptomatic. Repeat CT scan 07/19/2023 revealed the lingular bandlike opacity likely represents subsegmental atelectasis and surrounding centrilobular tree-in-bud micro nodules suggest small airways disease. Pulmonary micro nodules measuring up to 3mm as above and a 9mm right hepatic hypodensity suboptimally characterized on this examination. No routine imaging was recommended for her pulmonary micro nodules. I informed her that this scan is stable to improved compared to the last and that this looks more like sequelae from her asthma.  5. 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 Marlyce.  6. Congestive heart failure. CT scan in July, 2024 revealed cardiomegaly and coronary artery disease.   Plan:   I will prescribe Cortisporin Otic for her ear irritation. 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 She has a mammogram scheduled in September and is up to date on colonoscopies. Her CEA on 01/10/2024 was elevated at 6.40 up from 4.80 in January, 2025. Her CEA has been elevated in the past, in April. 2024 it was elevated at 8.3 but dropped to 5.2 in July, 2024. Her last CT chest was done in February, 2025 but she has not had any recent scanning of her abdomen. I will schedule a CT chest, abdomen, and pelvis as she has had recent abdomen tenderness and I will call her with the results.  I will draw a CBC and CMP today.  If all is well on her CT scan she can return in 6 months with CBC, CMP, and CEA as scheduled. The patient understands the plans discussed today and is in agreement with them.  She knows to contact our office if she develops concerns prior to her next appointment.  I provided 16 minutes of face-to-face time during this this encounter and > 50% was spent counseling as documented under my assessment and plan.  Wanda VEAR Cornish, MD  [facility] OF [facility] [address] [place] [address] Dept: [phone] Dept Fax: [phone]   No orders of the defined types were placed in this encounter.  I,Jasmine M Lassiter,acting as a scribe for Wanda VEAR Cornish, MD.,have documented all relevant documentation on the behalf of Wanda VEAR Cornish, MD,as directed by  Wanda VEAR Cornish, MD while in the presence of Wanda VEAR Cornish, MD.

## 2024-01-18 ENCOUNTER — Inpatient Hospital Stay: Payer: Medicare HMO | Attending: Oncology | Admitting: Oncology

## 2024-01-18 ENCOUNTER — Telehealth: Payer: Self-pay | Admitting: Cardiology

## 2024-01-18 ENCOUNTER — Other Ambulatory Visit: Payer: Self-pay | Admitting: Oncology

## 2024-01-18 ENCOUNTER — Inpatient Hospital Stay

## 2024-01-18 ENCOUNTER — Encounter: Payer: Self-pay | Admitting: Oncology

## 2024-01-18 VITALS — BP 135/74 | HR 51 | Temp 97.8°F | Resp 16 | Ht 60.0 in | Wt 161.0 lb

## 2024-01-18 DIAGNOSIS — Z85038 Personal history of other malignant neoplasm of large intestine: Secondary | ICD-10-CM | POA: Insufficient documentation

## 2024-01-18 DIAGNOSIS — C189 Malignant neoplasm of colon, unspecified: Secondary | ICD-10-CM

## 2024-01-18 DIAGNOSIS — R970 Elevated carcinoembryonic antigen [CEA]: Secondary | ICD-10-CM | POA: Insufficient documentation

## 2024-01-18 DIAGNOSIS — C182 Malignant neoplasm of ascending colon: Secondary | ICD-10-CM | POA: Diagnosis not present

## 2024-01-18 DIAGNOSIS — H60502 Unspecified acute noninfective otitis externa, left ear: Secondary | ICD-10-CM

## 2024-01-18 DIAGNOSIS — R978 Other abnormal tumor markers: Secondary | ICD-10-CM

## 2024-01-18 LAB — CBC WITH DIFFERENTIAL (CANCER CENTER ONLY)
Abs Immature Granulocytes: 0.02 K/uL (ref 0.00–0.07)
Basophils Absolute: 0 K/uL (ref 0.0–0.1)
Basophils Relative: 0 %
Eosinophils Absolute: 0.2 K/uL (ref 0.0–0.5)
Eosinophils Relative: 2 %
HCT: 36.4 % (ref 36.0–46.0)
Hemoglobin: 12 g/dL (ref 12.0–15.0)
Immature Granulocytes: 0 %
Lymphocytes Relative: 24 %
Lymphs Abs: 1.5 K/uL (ref 0.7–4.0)
MCH: 30.6 pg (ref 26.0–34.0)
MCHC: 33 g/dL (ref 30.0–36.0)
MCV: 92.9 fL (ref 80.0–100.0)
Monocytes Absolute: 0.6 K/uL (ref 0.1–1.0)
Monocytes Relative: 9 %
Neutro Abs: 4.1 K/uL (ref 1.7–7.7)
Neutrophils Relative %: 65 %
Platelet Count: 142 K/uL — ABNORMAL LOW (ref 150–400)
RBC: 3.92 MIL/uL (ref 3.87–5.11)
RDW: 13.3 % (ref 11.5–15.5)
WBC Count: 6.4 K/uL (ref 4.0–10.5)
nRBC: 0 % (ref 0.0–0.2)

## 2024-01-18 LAB — CMP (CANCER CENTER ONLY)
ALT: 15 U/L (ref 0–44)
AST: 24 U/L (ref 15–41)
Albumin: 3.9 g/dL (ref 3.5–5.0)
Alkaline Phosphatase: 76 U/L (ref 38–126)
Anion gap: 11 (ref 5–15)
BUN: 19 mg/dL (ref 8–23)
CO2: 23 mmol/L (ref 22–32)
Calcium: 9.3 mg/dL (ref 8.9–10.3)
Chloride: 106 mmol/L (ref 98–111)
Creatinine: 1.19 mg/dL — ABNORMAL HIGH (ref 0.44–1.00)
GFR, Estimated: 49 mL/min — ABNORMAL LOW (ref >60)
Glucose, Bld: 94 mg/dL (ref 70–99)
Potassium: 4 mmol/L (ref 3.5–5.1)
Sodium: 140 mmol/L (ref 135–145)
Total Bilirubin: 0.5 mg/dL (ref 0.0–1.2)
Total Protein: 6.3 g/dL — ABNORMAL LOW (ref 6.5–8.1)

## 2024-01-18 MED ORDER — METOPROLOL SUCCINATE ER 50 MG PO TB24: 50.0000 mg | ORAL_TABLET | Freq: Two times a day (BID) | ORAL | 3 refills | Status: AC

## 2024-01-18 MED ORDER — NEOMYCIN-POLYMYXIN-HC 3.5-10000-1 OT SOLN: 3.0000 [drp] | Freq: Four times a day (QID) | OTIC | 0 refills | Status: AC

## 2024-01-18 NOTE — Telephone Encounter (Signed)
 Refills has been sent to the pharmacy.

## 2024-01-18 NOTE — Telephone Encounter (Signed)
*  STAT* If patient is at the pharmacy, call can be transferred to refill team.   1. Which medications need to be refilled? (please list name of each medication and dose if known) metoprolol  succinate (TOPROL  XL) 50 MG 24 hr tablet    2. Would you like to learn more about the convenience, safety, & potential cost savings by using the [facility]?    3. Are you open to using the [facility] (Type [facility]. ).   4. Which pharmacy/location (including street and city if local pharmacy) is medication to be sent to? [facility] - , [place] - [address]    5. Do they need a 30 day or 90 day supply? 90 day

## 2024-01-22 ENCOUNTER — Other Ambulatory Visit: Payer: Self-pay | Admitting: Gastroenterology

## 2024-01-22 DIAGNOSIS — K449 Diaphragmatic hernia without obstruction or gangrene: Secondary | ICD-10-CM

## 2024-01-27 ENCOUNTER — Encounter (HOSPITAL_BASED_OUTPATIENT_CLINIC_OR_DEPARTMENT_OTHER): Payer: Self-pay | Admitting: Radiology

## 2024-02-02 ENCOUNTER — Encounter (HOSPITAL_BASED_OUTPATIENT_CLINIC_OR_DEPARTMENT_OTHER): Payer: Self-pay | Admitting: *Deleted

## 2024-02-02 ENCOUNTER — Ambulatory Visit (INDEPENDENT_AMBULATORY_CARE_PROVIDER_SITE_OTHER)
Admission: RE | Admit: 2024-02-02 | Discharge: 2024-02-02 | Disposition: A | Discharge status: Home / Self Care | Source: Ambulatory Visit | Attending: Oncology | Admitting: Oncology

## 2024-02-02 DIAGNOSIS — R978 Other abnormal tumor markers: Secondary | ICD-10-CM | POA: Diagnosis not present

## 2024-02-02 DIAGNOSIS — C189 Malignant neoplasm of colon, unspecified: Secondary | ICD-10-CM

## 2024-02-02 MED ORDER — IOHEXOL 300 MG/ML  SOLN
100.0000 mL | Freq: Once | INTRAMUSCULAR | Status: AC | PRN
  Administered 2024-02-02: 100 mL via INTRAVENOUS

## 2024-02-02 MED ORDER — IOHEXOL 300 MG/ML  SOLN
30.0000 mL | Freq: Once | INTRAMUSCULAR | Status: AC | PRN
  Administered 2024-02-02: 30 mL via ORAL

## 2024-02-06 ENCOUNTER — Telehealth: Payer: Self-pay

## 2024-02-06 NOTE — Telephone Encounter (Signed)
 Pt req test results from last week.

## 2024-02-07 NOTE — Telephone Encounter (Signed)
 CT chest is still pending reading by radiology.

## 2024-02-15 NOTE — Telephone Encounter (Addendum)
 Megan Parsons,NP:Can you let her know no evidence of metastatic disease or recurrence. Is this what she was wanting?   IMPRESSION: 1. Prior right hemicolectomy with ileocolic anastomosis, no evidence of local recurrence. 2. No evidence of metastatic disease in the chest, abdomen or pelvis. 3. Continued decrease in size of the clustered somewhat confluence nodularity in the lingula favored infectious/inflammatory. 4. Large hiatal hernia.

## 2024-03-12 ENCOUNTER — Other Ambulatory Visit: Payer: Self-pay | Admitting: Gastroenterology

## 2024-03-12 DIAGNOSIS — K219 Gastro-esophageal reflux disease without esophagitis: Secondary | ICD-10-CM

## 2024-03-21 NOTE — Progress Notes (Unsigned)
 Chief Complaint: follow-up GERD and med refill Primary GI Doctor:Dr. Charlanne  HPI:  Patient is a 68 year old female patient with past medical history of hypertension, GERD with HH, anxiety, depression, who presents for GERD and med refill.      Past GI procedures:  10/10/23 EGD Moderate Schatzki ring. Dilated. - 6 cm hiatal hernia. - The examination was otherwise normal. - No specimens collected.  03/23/22 EGD with Dr. Charlanne Impression:  - Moderate Schatzki ring. Dilated.  - Large hiatal hernia.  - Gastritis. Biopsied.    EGD 12/02/2020 - Schatzki ring. Biopsied. Dilated with 54Fr - 8 cm hiatal hernia with Cameron erosions. Biopsied. - Normal examined duodenum.   Colonoscopy 05/14/2020, recall 05/2025 -Pancolonic diverticulosis. -S/P right hemicolectomy. -Few erosions and neo-TI ?Importance (bx- mild inflammation.  No granulomas. -Non-bleeding internal hemorrhoids. -The examination was otherwise normal to anastomosis. No recurrence. -Repeat in 5 years.   CT chest/Abdo/pelvis 04/22/2021 -No evidence of metastatic disease in chest, Abdo/pelvis -Large hiatal hernia -Mild sigmoid diverticulosis.   -EGD 05/26/2017 8 cm HH with small paraesophageal component, Schatzki ring s/p dil 52 Fr -Colonoscopy 04/09/2015 ([facility])-6 mm sessile polyp s/p polypectomy (Bx- TA), R hemi, mild pancolonic div. -CT chest Abdo/pelvis with p.o. and IV contrast 07/2020: No recurrence, moderate to large hiatal hernia, atherosclerosis.  Interval History    Patient has history of GERD and currently taking Dexilant  60 mg po daily and Pepcid  20mg  at bedtime, accompanied by her daughter. Patient presents with main complaint intermittent issues of esophageal dysphagia that started few months ago. Patient denies nausea, vomiting, or weight loss.   Patient taking baby aspirin  81 mg po daily.   Wt Readings from Last 3 Encounters:  01/18/24 161 lb (73 kg)  11/18/23 158 lb 6.4 oz (71.8 kg)  10/10/23 159 lb  (72.1 kg)    Past Medical History:  Diagnosis Date   Adjustment disorder    Anesthesia complication 01/09/2018   Overview:  It takes me a while to wake up.  Formatting of this note might be different from the original. It takes me a while to wake up.   Anxiety 01/09/2018   Anxiety disorder    Arthritis    hands, back   Ascending aortic aneurysm (HCC) 38 mm based on echocardiogram from 2021 05/29/2020   Atypical chest pain 12/19/2016   Borderline diabetes 01/09/2018   CAD (coronary artery disease)    Cancer (HCC) 01/09/2018   Overview:  colon  Formatting of this note might be different from the original. colon   Cataract    removed by surgery   Chest pain    Colon cancer (HCC) 2012   Depression    Depression 01/09/2018   Dysphonia    Dysthymic disorder    Fatty liver 01/09/2018   Fusion of lumbar spine 08/21/2018   Gastroesophageal reflux disease    Hiatal hernia with GERD 01/09/2018   History of kidney stones 01/09/2018   Hyperlipidemia    Insomnia    Kidney stones    removed by surgery   MRSA (methicillin resistant Staphylococcus aureus)    MRSA infection 01/09/2018   Overview:  states winter of 2016, treated at [facility] by Rexene Crazier NP  Formatting of this note might be different from the original. states winter of 2016, treated at [facility] by Rexene Crazier NP   Neuropathy    Obesity    Palpitations 01/20/2017   Pneumonia    Pre-syncope    Primary hypertension 06/02/2022  Sinus bradycardia 02/10/2021   Sleep apnea    Varicose veins of bilateral lower extremities with other complications 05/05/2021    Past Surgical History:  Procedure Laterality Date   ABDOMINAL HYSTERECTOMY     BACK SURGERY     BLADDER SURGERY     CATARACT EXTRACTION, BILATERAL     COLON SURGERY  2012   Due to colon cancer stage 3   COLONOSCOPY  04/09/2015   Colonic polyp status post polypectomy. Mild pancolonic diverticulosis/ History of colon cancer status post  right hemicolectomy. No evidence of recurrence    ELBOW SURGERY Left 09/07/2023   Due to pinched nerve   ESOPHAGOGASTRODUODENOSCOPY  05/26/2017   Schatzkis ring statust post esophageal dilatation. Hiatal hernia.    EYE SURGERY Left 01/26/2022   to remove floaters   PORT-A-CATH REMOVAL     removal kidney stones     TUBAL LIGATION     UPPER GASTROINTESTINAL ENDOSCOPY      Current Outpatient Medications  Medication Sig Dispense Refill   aspirin  EC 81 MG tablet Take 81 mg by mouth daily.     B Complex-Folic Acid (B COMPLEX-VITAMIN B12 PO) Take 1 tablet by mouth daily. Unknown strength     CALCIUM  PO Take 2 tablets by mouth daily. Unknown strength     Cholecalciferol (VITAMIN D3) 2000 units TABS Take 2,000 Units by mouth daily.     dexlansoprazole  (DEXILANT ) 60 MG capsule TAKE 1  BY MOUTH ONCE DAILY. PLEASE CALL [phone] TO SCHEDULE AN OFFICE VISIT FOR MORE REFILLS 30 capsule 3   diclofenac (VOLTAREN) 75 MG EC tablet      diltiazem  (CARDIZEM  CD) 120 MG 24 hr capsule Take 120 mg by mouth.     DULoxetine  (CYMBALTA ) 60 MG capsule Take 60 mg by mouth daily.  1   famotidine  (PEPCID ) 20 MG tablet TAKE 1 TABLET BY MOUTH AT BEDTIME . APPOINTMENT REQUIRED FOR FUTURE REFILLS. CALL [phone] TO SCHEDULE 90 tablet 0   furosemide  (LASIX ) 20 MG tablet Take 1 tablet (20 mg total) by mouth daily. 90 tablet 2   meloxicam (MOBIC) 15 MG tablet Take 15 mg by mouth daily.     metoprolol  succinate (TOPROL -XL) 50 MG 24 hr tablet Take 1 tablet (50 mg total) by mouth 2 (two) times daily. 180 tablet 3   Multiple Vitamins-Minerals (CENTRUM SILVER 50+WOMEN) TABS Take 1 tablet by mouth daily. Unknown strength     neomycin -polymyxin-hydrocortisone (CORTISPORIN) OTIC solution Place 3 drops into the left ear 4 (four) times daily. 10 mL 0   nitroGLYCERIN  (NITROSTAT ) 0.4 MG SL tablet Place 1 tablet (0.4 mg total) under the tongue every 5 (five) minutes x 3 doses as needed for chest pain. 25 tablet 2   pregabalin  (LYRICA) 25 MG capsule Take 25-50 mg by mouth at bedtime.     RABEprazole  (ACIPHEX ) 20 MG tablet Take 20 mg by mouth daily.     ranolazine  (RANEXA ) 500 MG 12 hr tablet Take 1 tablet (500 mg total) by mouth 2 (two) times daily. 180 tablet 3   rosuvastatin  (CRESTOR ) 40 MG tablet Take 1 tablet (40 mg total) by mouth daily. 90 tablet 3   No current facility-administered medications for this visit.    Allergies as of 03/22/2024 - Review Complete 02/02/2024  Allergen Reaction Noted   Penicillins Rash 12/19/2016   Codeine Other (See Comments) 06/30/2012   Sulfa antibiotics Rash and Dermatitis 06/30/2012   Wound dressing adhesive Dermatitis 12/19/2016   Wound dressings Dermatitis 12/19/2016   Aciphex  [  rabeprazole ] Rash 10/31/2020   Guaifenesin & derivatives Rash 12/19/2016   Latex Rash 12/19/2016   Other Rash 12/19/2016   Pseudoephedrine Rash 12/19/2016   Sulfasalazine Rash 12/19/2016   Tape Rash and Dermatitis 12/19/2016    Family History  Problem Relation Age of Onset   CAD Mother        Age of Onset 12   Hypertension Mother    Kidney disease Mother    Heart failure Father    COPD Father    CVA Brother    Colon cancer Neg Hx    Rectal cancer Neg Hx    Stomach cancer Neg Hx    Esophageal cancer Neg Hx     Review of Systems:    Constitutional: No weight loss, fever, chills, weakness or fatigue HEENT: Eyes: No change in vision               Ears, Nose, Throat:  No change in hearing or congestion Skin: No rash or itching Cardiovascular: No chest pain, chest pressure or palpitations   Respiratory: No SOB or cough Gastrointestinal: See HPI and otherwise negative Genitourinary: No dysuria or change in urinary frequency Neurological: No headache, dizziness or syncope Musculoskeletal: No new muscle or joint pain Hematologic: No bleeding or bruising Psychiatric: No history of depression or anxiety    Physical Exam:  Vital signs: LMP  (LMP Unknown)   Constitutional:    Pleasant  female appears to be in NAD, Well developed, Well nourished, alert and cooperative Throat: Oral cavity and pharynx without inflammation, swelling or lesion.  Respiratory: Respirations even and unlabored. Lungs clear to auscultation bilaterally.   No wheezes, crackles, or rhonchi.  Cardiovascular: Normal S1, S2. Regular rate and rhythm. No peripheral edema, cyanosis or pallor.  Gastrointestinal:  Soft, nondistended, nontender. No rebound or guarding. Normal bowel sounds. No appreciable masses or hepatomegaly. Rectal:  Not performed.  Msk:  Symmetrical without gross deformities. Without edema, no deformity or joint abnormality.  Neurologic:  Alert and  oriented x4;  grossly normal neurologically.  Skin:   Dry and intact without significant lesions or rashes. Psychiatric: Oriented to person, place and time. Demonstrates good judgement and reason without abnormal affect or behaviors.  RELEVANT LABS AND IMAGING: CBC    Latest Ref Rng & Units 01/18/2024   10:02 AM 06/28/2023    9:06 AM 12/31/2022   12:00 AM  CBC  WBC 4.0 - 10.5 K/uL 6.4  7.9  5.6   Hemoglobin 12.0 - 15.0 g/dL 87.9  86.8  87.3   Hematocrit 36.0 - 46.0 % 36.4  38.2  38.0   Platelets 150 - 400 K/uL 142  274  152      CMP     Latest Ref Rng & Units 01/18/2024   10:02 AM 06/29/2023    9:03 AM 06/28/2023    9:06 AM  CMP  Glucose 70 - 99 mg/dL 94   888   BUN 8 - 23 mg/dL 19   12   Creatinine 9.55 - 1.00 mg/dL 8.80   8.29   Sodium 864 - 145 mmol/L 140   137   Potassium 3.5 - 5.1 mmol/L 4.0   3.8   Chloride 98 - 111 mmol/L 106   103   CO2 22 - 32 mmol/L 23   23   Calcium  8.9 - 10.3 mg/dL 9.3   9.7   Total Protein 6.5 - 8.1 g/dL 6.3   6.3   Total Bilirubin 0.0 - 1.2 mg/dL 0.5  0.5   Alkaline Phos 38 - 126 U/L 76   168   AST 15 - 41 U/L 24  19  23    ALT 0 - 44 U/L 15  12  13       Lab Results  Component Value Date   TSH 4.300 02/11/2023   01/31/23 echo- Left ventricular ejection fraction, by estimation, is 55 to  60%.   Assessment: No diagnosis found.    68 year old female patient with GERD and large HH with esophageal dysphagia. H/O Schatzki's ring s/p eso dil 2023. Schedule endoscopy with dilation today. She will discuss with Dr. Charlanne after procedure if [facility] is large enough for surgical intervention. Refilled her medications for GERD.  Plan: - Continue Dexilant  60mg  po daily, refilled -Continue Pepcid  20mg  at bedtime, refilled -Schedule EGD with dilatation in [facility] with Dr. Charlanne. The risks and benefits of EGD with possible biopsies and esophageal dilation were discussed with the patient who agrees to proceed. -recall colonoscopy 05/2025   Thank you for the courtesy of this consult. Please call me with any questions or concerns.   Silas Sedam, FNP-C [facility] 03/21/2024, 1:32 PM  Cc: Silver Lamar LABOR, MD

## 2024-03-22 ENCOUNTER — Ambulatory Visit: Admitting: Gastroenterology

## 2024-03-22 ENCOUNTER — Encounter: Payer: Self-pay | Admitting: Gastroenterology

## 2024-03-22 VITALS — BP 120/60 | HR 48 | Ht 60.0 in | Wt 159.0 lb

## 2024-03-22 DIAGNOSIS — Z7982 Long term (current) use of aspirin: Secondary | ICD-10-CM | POA: Diagnosis not present

## 2024-03-22 DIAGNOSIS — K449 Diaphragmatic hernia without obstruction or gangrene: Secondary | ICD-10-CM | POA: Diagnosis not present

## 2024-03-22 DIAGNOSIS — K219 Gastro-esophageal reflux disease without esophagitis: Secondary | ICD-10-CM

## 2024-03-22 DIAGNOSIS — Z79899 Other long term (current) drug therapy: Secondary | ICD-10-CM

## 2024-03-22 MED ORDER — DEXLANSOPRAZOLE 60 MG PO CPDR: 60.0000 mg | DELAYED_RELEASE_CAPSULE | Freq: Every day | ORAL | 3 refills | Status: AC

## 2024-03-22 MED ORDER — FAMOTIDINE 20 MG PO TABS: 20.0000 mg | ORAL_TABLET | Freq: Every day | ORAL | 3 refills | Status: AC

## 2024-03-22 NOTE — Patient Instructions (Signed)
 _______________________________________________________  If your blood pressure at your visit was 140/90 or greater, please contact your primary care physician to follow up on this.  _______________________________________________________  If you are age 70 or older, your body mass index should be between 23-30. Your Body mass index is 31.05 kg/m. If this is out of the aforementioned range listed, please consider follow up with your Primary Care Provider.  If you are age 5 or younger, your body mass index should be between 19-25. Your Body mass index is 31.05 kg/m. If this is out of the aformentioned range listed, please consider follow up with your Primary Care Provider.   ________________________________________________________  The [facility] providers would like to encourage you to use MYCHART to communicate with providers for non-urgent requests or questions.  Due to long hold times on the telephone, sending your provider a message by [facility] may be a faster and more efficient way to get a response.  Please allow 48 business hours for a response.  Please remember that this is for non-urgent requests.  _______________________________________________________  [facility] is using a team-based approach to care.  Your team is made up of your doctor and two to three APPS. Our APPS (Nurse Practitioners and Physician Assistants) work with your physician to ensure care continuity for you. They are fully qualified to address your health concerns and develop a treatment plan. They communicate directly with your gastroenterologist to care for you. Seeing the Advanced Practice Practitioners on your physician's team can help you by facilitating care more promptly, often allowing for earlier appointments, access to diagnostic testing, procedures, and other specialty referrals.   Thank you for trusting me with your gastrointestinal care. Deanna May, FNP-C

## 2024-04-10 ENCOUNTER — Other Ambulatory Visit: Payer: Self-pay | Admitting: Cardiology

## 2024-05-24 ENCOUNTER — Other Ambulatory Visit: Payer: Self-pay | Admitting: Cardiology

## 2024-06-26 ENCOUNTER — Inpatient Hospital Stay: Payer: Medicare HMO | Attending: Oncology

## 2024-06-26 ENCOUNTER — Other Ambulatory Visit: Payer: Self-pay | Admitting: Oncology

## 2024-06-26 ENCOUNTER — Inpatient Hospital Stay: Admitting: Oncology

## 2024-06-26 ENCOUNTER — Encounter: Payer: Self-pay | Admitting: Oncology

## 2024-06-26 VITALS — BP 117/72 | HR 89 | Temp 97.6°F | Resp 16 | Ht 60.0 in | Wt 169.3 lb

## 2024-06-26 DIAGNOSIS — C182 Malignant neoplasm of ascending colon: Secondary | ICD-10-CM | POA: Diagnosis not present

## 2024-06-26 DIAGNOSIS — R978 Other abnormal tumor markers: Secondary | ICD-10-CM | POA: Diagnosis not present

## 2024-06-26 DIAGNOSIS — Z85038 Personal history of other malignant neoplasm of large intestine: Secondary | ICD-10-CM | POA: Insufficient documentation

## 2024-06-26 DIAGNOSIS — Z9221 Personal history of antineoplastic chemotherapy: Secondary | ICD-10-CM | POA: Insufficient documentation

## 2024-06-26 DIAGNOSIS — C189 Malignant neoplasm of colon, unspecified: Secondary | ICD-10-CM

## 2024-06-26 LAB — CBC WITH DIFFERENTIAL (CANCER CENTER ONLY)
Abs Immature Granulocytes: 0.02 K/uL (ref 0.00–0.07)
Basophils Absolute: 0 K/uL (ref 0.0–0.1)
Basophils Relative: 1 %
Eosinophils Absolute: 0.3 K/uL (ref 0.0–0.5)
Eosinophils Relative: 4 %
HCT: 41.7 % (ref 36.0–46.0)
Hemoglobin: 13.8 g/dL (ref 12.0–15.0)
Immature Granulocytes: 0 %
Lymphocytes Relative: 25 %
Lymphs Abs: 1.8 K/uL (ref 0.7–4.0)
MCH: 29.9 pg (ref 26.0–34.0)
MCHC: 33.1 g/dL (ref 30.0–36.0)
MCV: 90.5 fL (ref 80.0–100.0)
Monocytes Absolute: 0.8 K/uL (ref 0.1–1.0)
Monocytes Relative: 10 %
Neutro Abs: 4.4 K/uL (ref 1.7–7.7)
Neutrophils Relative %: 60 %
Platelet Count: 169 K/uL (ref 150–400)
RBC: 4.61 MIL/uL (ref 3.87–5.11)
RDW: 13.2 % (ref 11.5–15.5)
WBC Count: 7.3 K/uL (ref 4.0–10.5)
nRBC: 0 % (ref 0.0–0.2)

## 2024-06-26 LAB — CMP (CANCER CENTER ONLY)
ALT: 16 U/L (ref 0–44)
AST: 30 U/L (ref 15–41)
Albumin: 4.5 g/dL (ref 3.5–5.0)
Alkaline Phosphatase: 99 U/L (ref 38–126)
Anion gap: 11 (ref 5–15)
BUN: 17 mg/dL (ref 8–23)
CO2: 26 mmol/L (ref 22–32)
Calcium: 10.1 mg/dL (ref 8.9–10.3)
Chloride: 103 mmol/L (ref 98–111)
Creatinine: 1.34 mg/dL — ABNORMAL HIGH (ref 0.44–1.00)
GFR, Estimated: 42 mL/min — ABNORMAL LOW
Glucose, Bld: 99 mg/dL (ref 70–99)
Potassium: 4.9 mmol/L (ref 3.5–5.1)
Sodium: 140 mmol/L (ref 135–145)
Total Bilirubin: 0.7 mg/dL (ref 0.0–1.2)
Total Protein: 7 g/dL (ref 6.5–8.1)

## 2024-06-26 LAB — CEA (ACCESS): CEA (CHCC): 6.94 ng/mL — ABNORMAL HIGH (ref 0.00–5.00)

## 2024-06-26 NOTE — Progress Notes (Signed)
 " [facility]  [address] [place],  [place]  [address] [phone]  Clinic Day: 06/26/2024   Referring physician: Silver Lamar LABOR, MD  CHIEF COMPLAINT:  CC:  History of stage IIIB colon cancer with mild elevation of the CEA  Current Treatment:   Observation  HISTORY OF PRESENT ILLNESS:  [name] is a 69 y.o. 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  She was treated with surgical resection.  Pathology revealed a 3.6 centimeter, grade 2, adenocarcinoma with 3 of 45 nodes positive for metastasis.  She received adjuvant chemotherapy with FOLFOX for 5 cycles.  Oxaliplatin was then discontinued because of severe cytopenias.  She continued 5 fluorouracil and leucovorin for 4 more cycles, but then developed a severe allergic reaction to the 5-FU.  She was then placed on Xeloda for 6 weeks to complete a full 6 months of adjuvant chemotherapy.  She also had difficulty tolerating the Xeloda.  She has not had evidence of recurrence.  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  The patient is a nonsmoker.  She had a CT abdomen/pelvis in June 2016 for abdominal pain, but this was negative other than a small hiatal hernia.  MRI of the lumbar spine in July 2019 revealed left sided neural impingement at L1-2 with multilevel spondylosis. She had spinal surgery with Dr. Marlyce.  Her CEA was mildly elevated in April 2021 at 5.5. CT chest, abdomen and pelvis at that time did not reveal any evidence of malignancy. The CEA was 5 in July, then 5.2 in October. Colonoscopy with Dr. Charlanne in December 2021 was unremarkable. Repeat in 5 years was recommended.   The CEA went up to 7.5 in January 2022, and went down to 7.0 in February. CT chest, abdomen and pelvis from February did  not reveal any evidence of local recurrence of metastatic disease. There was a stable moderate to large hiatal hernia noted.  The CEA was down to 5.6 in May, then 5.2 in August. She states she has 40% coronary artery blockage, for which she is on aspirin  81 mg daily, metoprolol  25 mg twice daily, nitroglycerin  prn, and rosuvastatin  20 mg daily through Dr. Krasowski. CT chest, abdomen and pelvis from November 9th of 2022 revealed no findings to suggest metastatic disease. CEA went back up to 5.9 in November of 2022. CEA was 4.5 in April 2023, the best it's been, and first normal reading since January of 2022. She has never been a smoker.  INTERVAL HISTORY:  [name] is here for routine follow up for her remote history of stage IIIB colon cancer with mild chronic elevation of the CEA. Patient states that she feels well but complains of back pain and depression. Her CEA has gone up and down over the years as high as 8.2 but we have never found evidence of recurrent cancer. She does not smoke. Her CEA went up from 4.8 to 6.4 in July, 2025 so CT scans were done. CT chest, abdomen, and pelvis done on 02/02/2024 revealed that her prior right hemicolectomy with ileocolic anastomosis had no evidence of local recurrence, no evidence of metastatic disease in the chest, abdomen or pelvis, and a continued decrease in size of the clustered somewhat confluence nodularity in the lingula. She  has a WBC of 7.3, hemoglobin of 13.8, and platelet count of 169,000. Her CMP is normal other than an elevated creatinine of 1.34 up from 1.19. I explained the diagnosis of chronic kidney disease stage 3 and gave her copies of her labs. I instructed her to avoid NSAIDS and increase her fluid intake. Her CEA is pending and I will call her with the results. Patient informed me that she does currently take meloxicam 15mg  and I instructed her to discontinue this unless in severe pain. I will also contact her PCP about this as he may want to make a  referral to nephrology. I will see her back in 6 months with CBC, CMP, and CEA.   She denies fever, chills, night sweats, or other signs of infection. She denies cardiorespiratory and gastrointestinal issues. Her appetite is good and Her weight has increased 8 pounds over last 5 months.  REVIEW OF SYSTEMS:  Review of Systems  Constitutional:  Positive for fatigue. Negative for appetite change, chills, diaphoresis, fever and unexpected weight change.  HENT:  Negative.  Negative for hearing loss, lump/mass, mouth sores, nosebleeds, sore throat, tinnitus, trouble swallowing and voice change.        Left ear irritation  Eyes: Negative.  Negative for eye problems and icterus.  Respiratory: Negative.  Negative for chest tightness, cough, hemoptysis, shortness of breath and wheezing.   Cardiovascular: Negative.  Negative for chest pain, leg swelling and palpitations.  Gastrointestinal:  Negative for abdominal distention, abdominal pain, blood in stool, constipation, diarrhea, nausea, rectal pain and vomiting.       Acid reflux  Endocrine: Negative.   Genitourinary: Negative.  Negative for bladder incontinence, difficulty urinating, dyspareunia, dysuria, frequency, hematuria, menstrual problem, nocturia, pelvic pain, vaginal bleeding and vaginal discharge.   Musculoskeletal:  Positive for back pain (10/10). Negative for arthralgias, flank pain, gait problem, myalgias, neck pain and neck stiffness.       Sciatica pain in her right hip/leg  Skin: Negative.  Negative for itching, rash and wound.  Neurological:  Positive for numbness (fingers/feet). Negative for dizziness, extremity weakness, gait problem, headaches, light-headedness, seizures and speech difficulty.  Hematological: Negative.  Negative for adenopathy. Does not bruise/bleed easily.  Psychiatric/Behavioral:  Positive for depression (sister expired in September, 2025). Negative for confusion, decreased concentration, sleep disturbance and  suicidal ideas. The patient is nervous/anxious.     VITALS:  Blood pressure 117/72, pulse 89, temperature 97.6 F (36.4 C), temperature source Oral, resp. rate 16, height 5' (1.524 m), weight 169 lb 4.8 oz (76.8 kg), SpO2 100%.  Wt Readings from Last 3 Encounters:  06/26/24 169 lb 4.8 oz (76.8 kg)  03/22/24 159 lb (72.1 kg)  01/18/24 161 lb (73 kg)    Body mass index is 33.06 kg/m.  Performance status (ECOG): 1 - Symptomatic but completely ambulatory  PHYSICAL EXAM:  Physical Exam Vitals and nursing note reviewed.  Constitutional:      General: She is not in acute distress.    Appearance: Normal appearance. She is normal weight. She is not ill-appearing, toxic-appearing or diaphoretic.  HENT:     Head: Normocephalic and atraumatic.     Right Ear: Tympanic membrane and external ear normal. There is no impacted cerumen.     Left Ear: Tympanic membrane and external ear normal. There is no impacted cerumen.     Nose: Nose normal. No congestion or rhinorrhea.     Mouth/Throat:     Mouth: Mucous membranes are moist.  Pharynx: Oropharynx is clear. No oropharyngeal exudate or posterior oropharyngeal erythema.  Eyes:     General: No scleral icterus.       Right eye: No discharge.        Left eye: No discharge.     Extraocular Movements: Extraocular movements intact.     Conjunctiva/sclera: Conjunctivae normal.     Pupils: Pupils are equal, round, and reactive to light.  Neck:     Vascular: No carotid bruit.  Cardiovascular:     Rate and Rhythm: Normal rate and regular rhythm.     Pulses: Normal pulses.     Heart sounds: Normal heart sounds. No murmur heard.    No friction rub. No gallop.  Pulmonary:     Effort: Pulmonary effort is normal. No respiratory distress.     Breath sounds: Normal breath sounds. No stridor. No wheezing, rhonchi or rales.  Chest:     Chest wall: No tenderness.  Abdominal:     General: Bowel sounds are normal. There is no distension.     Palpations:  Abdomen is soft. There is no hepatomegaly, splenomegaly or mass.     Tenderness: There is abdominal tenderness in the right upper quadrant and epigastric area. There is no right CVA tenderness, left CVA tenderness, guarding or rebound.     Hernia: No hernia is present.     Comments: I can feel the liver right at the right costal margin and its mildly tender  Musculoskeletal:        General: No swelling, tenderness, deformity or signs of injury. Normal range of motion.     Cervical back: Normal range of motion and neck supple. No rigidity or tenderness.     Right lower leg: No edema.     Left lower leg: No edema.  Lymphadenopathy:     Cervical: No cervical adenopathy.  Skin:    General: Skin is warm and dry.     Coloration: Skin is not jaundiced or pale.     Findings: No bruising, erythema, lesion or rash.  Neurological:     General: No focal deficit present.     Mental Status: She is alert and oriented to person, place, and time. Mental status is at baseline.     Cranial Nerves: No cranial nerve deficit.     Sensory: No sensory deficit.     Motor: No weakness.     Coordination: Coordination normal.     Gait: Gait normal.     Deep Tendon Reflexes: Reflexes normal.  Psychiatric:        Mood and Affect: Mood normal.        Behavior: Behavior normal.        Thought Content: Thought content normal.        Judgment: Judgment normal.    LABS:      Latest Ref Rng & Units 06/26/2024    8:55 AM 01/18/2024   10:02 AM 06/28/2023    9:06 AM  CBC  WBC 4.0 - 10.5 K/uL 7.3  6.4  7.9   Hemoglobin 12.0 - 15.0 g/dL 86.1  87.9  86.8   Hematocrit 36.0 - 46.0 % 41.7  36.4  38.2   Platelets 150 - 400 K/uL 169  142  274       Latest Ref Rng & Units 06/26/2024    8:55 AM 01/18/2024   10:02 AM 06/29/2023    9:03 AM  CMP  Glucose 70 - 99 mg/dL 99  94    BUN  8 - 23 mg/dL 17  19    Creatinine 9.55 - 1.00 mg/dL 8.65  8.80    Sodium 864 - 145 mmol/L 140  140    Potassium 3.5 - 5.1 mmol/L 4.9  4.0     Chloride 98 - 111 mmol/L 103  106    CO2 22 - 32 mmol/L 26  23    Calcium  8.9 - 10.3 mg/dL 89.8  9.3    Total Protein 6.5 - 8.1 g/dL 7.0  6.3    Total Bilirubin 0.0 - 1.2 mg/dL 0.7  0.5    Alkaline Phos 38 - 126 U/L 99  76    AST 15 - 41 U/L 30  24  19    ALT 0 - 44 U/L 16  15  12     Lab Results  Component Value Date   CEA1 8.3 (H) 09/22/2022   CEA 6.40 (H) 01/10/2024   /  CEA  Date Value Ref Range Status  09/22/2022 8.3 (H) 0.0 - 4.7 ng/mL Final    Comment:    (NOTE)                             Nonsmokers          <3.9                             Smokers             <5.6 [employer] Electrochemiluminescence Immunoassay (ECLIA) Values obtained with different assay methods or kits cannot be used interchangeably.  Results cannot be interpreted as absolute evidence of the presence or absence of malignant disease. Performed At: [facility] [address] [place], [place] [id number] Jennette Shorter MD Ey:[phone]    CEA ([facility])  Date Value Ref Range Status  01/10/2024 6.40 (H) 0.00 - 5.00 ng/mL Final    Comment:    (NOTE) This test was performed using [employer] Coul[employer]'s paramagnetic chemiluminescent immunoassay. Values obtained from different assay methods cannot be used interchangeably. Please note that up to 8% of patients who smoke may see values 5.1-10.0 ng/ml and 1% of patients who smoke may see CEA levels >10.0 ng/ml. Performed at [facility], [address], [place], [place] [address]    STUDIES:      HISTORY:   Allergies:  Allergies  Allergen Reactions   Penicillins Rash    Has patient had a PCN reaction causing immediate rash, facial/tongue/throat swelling, SOB or lightheadedness with hypotension: Yes Has patient had a PCN reaction causing severe rash involving mucus membranes or skin necrosis: No Has patient had a PCN reaction that required hospitalization: No Has patient had a PCN reaction occurring within the last  10 years: No If all of the above answers are NO, then may proceed with Cephalosporin use.    Codeine Other (See Comments)    Dizziness and lethargy   Sulfa Antibiotics Rash and Dermatitis   Wound Dressing Adhesive Dermatitis    Cannot be on the skin for an extended period of time   Wound Dressings Dermatitis    Cannot be on the skin for an extended period of time   Aciphex  [Rabeprazole ] Rash   Guaifenesin & Derivatives Rash   Latex Rash    Usually from adhesive tapes   Other Rash    Tape cannot be on the skin for an extended period of time   Pseudoephedrine Rash  Sulfasalazine Rash   Tape Rash and Dermatitis    Cannot be on the skin for an extended period of time    Current Medications: Current Outpatient Medications  Medication Sig Dispense Refill   aspirin  EC 81 MG tablet Take 81 mg by mouth daily.     B Complex-Folic Acid (B COMPLEX-VITAMIN B12 PO) Take 1 tablet by mouth daily. Unknown strength     CALCIUM  PO Take 2 tablets by mouth daily. Unknown strength     Cholecalciferol (VITAMIN D3) 2000 units TABS Take 2,000 Units by mouth daily.     dexlansoprazole  (DEXILANT ) 60 MG capsule Take 1 capsule (60 mg total) by mouth daily. 90 capsule 3   diclofenac (VOLTAREN) 75 MG EC tablet      diltiazem  (CARDIZEM  CD) 120 MG 24 hr capsule Take 120 mg by mouth.     DULoxetine  (CYMBALTA ) 60 MG capsule Take 60 mg by mouth daily.  1   famotidine  (PEPCID ) 20 MG tablet Take 1 tablet (20 mg total) by mouth at bedtime. 90 tablet 3   furosemide  (LASIX ) 20 MG tablet Take 1 tablet (20 mg total) by mouth daily. 90 tablet 2   meloxicam (MOBIC) 15 MG tablet Take 15 mg by mouth daily.     metoprolol  succinate (TOPROL -XL) 50 MG 24 hr tablet Take 1 tablet (50 mg total) by mouth 2 (two) times daily. 180 tablet 3   Multiple Vitamins-Minerals (CENTRUM SILVER 50+WOMEN) TABS Take 1 tablet by mouth daily. Unknown strength     neomycin -polymyxin-hydrocortisone (CORTISPORIN) OTIC solution Place 3 drops into  the left ear 4 (four) times daily. 10 mL 0   nitroGLYCERIN  (NITROSTAT ) 0.4 MG SL tablet Place 1 tablet (0.4 mg total) under the tongue every 5 (five) minutes x 3 doses as needed for chest pain. 25 tablet 2   pregabalin (LYRICA) 25 MG capsule Take 25-50 mg by mouth at bedtime.     ranolazine  (RANEXA ) 500 MG 12 hr tablet Take 1 tablet by mouth twice daily 180 tablet 1   rosuvastatin  (CRESTOR ) 40 MG tablet Take 1 tablet by mouth once daily 90 tablet 0   No current facility-administered medications for this visit.   ASSESSMENT & PLAN:  Assessment:   1. History of stage IIIB colon cancer diagnosed in December 2012.  The patient remains without obvious evidence of recurrence.  Colonoscopy in December 2021 was unremarkable and she will be due repeat examination in 2026.   2. Mild elevation of the CEA of uncertain etiology.  CT imaging in February and November 2022 did not reveal any evidence of malignancy.  CEA was in the normal range in April 2023 but then went back up again a little in September and October.  However, it increased to 8.3 in April, 2024, but back down to 5.2 in July, 2024 and now down to 4.8 in January, 2025. Her CEA went up from 4.8 to 6.4 in July, 2025 so CT scans were done. CT chest, abdomen, and pelvis done on 02/02/2024 revealed that her prior right hemicolectomy with ileocolic anastomosis had no evidence of local recurrence, no evidence of metastatic disease in the chest, abdomen or pelvis, and a continued decrease in size of the clustered somewhat confluence nodularity in the lingula.    3. Moderate to large hiatal hernia. Dr. Charlanne performed an EGD in October, 2023 with findings of a 6 cm hiatal hernia and diffuse inflammation. CT in July, 2024 revealed a large hiatal hernia with the gastric body fundus in an intra  thoracic position.  4. Nodularity in the lingula noted in April, 2024.  A repeat CT was done on December 28, 2022 which showed slightly diminished size but increased  consolidated appearance of heterogenous air space opacity and nodularity and the posterior lingula abutting the fissure. This measured 2.9cm and was previously 3.2cm in maximum diameter. This is most likely a non-specific infectious/inflammatory process. We have monitored periodically. Repeat CT scan 07/19/2023 is stable to improved compared to the last and that this looks more like sequelae from her asthma.  5. 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 Marlyce.  6. Congestive heart failure. CT scan in July, 2024 revealed cardiomegaly and coronary artery disease.   Plan:   Her CEA has gone up and down over the years as high as 8.3 but we have never found evidence of recurrent cancer. She does not smoke. Her CEA went up from 4.8 to 6.4 in July, 2025 so CT scans were done. CT chest, abdomen, and pelvis done on 02/02/2024 revealed that her prior right hemicolectomy with ileocolic anastomosis had no evidence of local recurrence, no evidence of metastatic disease in the chest, abdomen or pelvis, and a continued decrease in size of the clustered somewhat confluence nodularity in the lingula. She has a WBC of 7.3, hemoglobin of 13.8, and platelet count of 169,000. Her CMP is normal other than an elevated creatinine of 1.34 up from 1.19. I explained the diagnosis of chronic kidney disease stage 3 and gave her copies of her labs. I instructed her to avoid NSAIDS and increase her fluid intake. Her CEA is pending and I will call her with the results. Patient informed me that she does currently take meloxicam 15mg  and I instructed her to discontinue this unless in severe pain. I will also contact her PCP about this as he may want to make a referral to nephrology. I will see her back in 6 months with CBC, CMP, and CEA. The patient understands the plans discussed today and is in agreement with them.  She knows to contact our office if she develops concerns prior to her  next appointment.  I provided 30 minutes of face-to-face time during this this encounter and > 50% was spent counseling as documented under my assessment and plan.  Wanda VEAR Cornish, MD  [facility] OF [facility] [address] [place] [address] Dept: [phone] Dept Fax: [phone]   No orders of the defined types were placed in this encounter.  I,Jasmine M Lassiter,acting as a scribe for Wanda VEAR Cornish, MD.,have documented all relevant documentation on the behalf of Wanda VEAR Cornish, MD,as directed by  Wanda VEAR Cornish, MD while in the presence of Wanda VEAR Cornish, MD.  "

## 2024-06-28 ENCOUNTER — Telehealth: Payer: Self-pay

## 2024-06-28 NOTE — Telephone Encounter (Signed)
Called patient and notified of message.  °

## 2024-06-28 NOTE — Telephone Encounter (Signed)
-----   Message from Wanda Cornish, MD sent at 06/28/2024  7:29 AM EST ----- Regarding: call Tell her latest CEA is stable at 6.94

## 2024-06-29 ENCOUNTER — Inpatient Hospital Stay: Payer: Medicare HMO | Admitting: Oncology

## 2024-07-14 ENCOUNTER — Other Ambulatory Visit: Payer: Self-pay | Admitting: Cardiology

## 2024-12-26 ENCOUNTER — Inpatient Hospital Stay: Admitting: Oncology

## 2024-12-26 ENCOUNTER — Inpatient Hospital Stay
